# Patient Record
Sex: Male | Born: 1935 | Race: White | Smoking: Former smoker
Health system: Southern US, Community
[De-identification: ages and names within clinical notes are randomized; demographics above are authoritative.]

## PROBLEM LIST (undated history)

## (undated) DIAGNOSIS — I10 Essential (primary) hypertension: Secondary | ICD-10-CM

## (undated) DIAGNOSIS — G2581 Restless legs syndrome: Secondary | ICD-10-CM

## (undated) HISTORY — PX: FISSURECTOMY: SHX5244

## (undated) HISTORY — PX: ABLATION: SHX5711

## (undated) HISTORY — PX: APPENDECTOMY: SHX54

## (undated) NOTE — *Deleted (*Deleted)
Transition of Care [facility]) - Progression Note    Patient Details  Name: [name] MRN: [id number] Date of Birth: 01-30-1930  Transition of Care [facility]) CM/SW Contact  Liliana Cline, LCSW Phone Number: 04/10/2020, 1:26 PM  Clinical Narrative:       Expected Discharge Plan: Skilled Nursing Facility Barriers to Discharge: Continued Medical Work up  Expected Discharge Plan and Services Expected Discharge Plan: Skilled Nursing Facility       Living arrangements for the past 2 months: Assisted Living Facility                                       Social Determinants of Health (SDOH) Interventions    Readmission Risk Interventions Readmission Risk Prevention Plan 04/07/2020  Transportation Screening Complete  PCP or Specialist Appt within 3-5 Days Complete  HRI or Home Care Consult Complete  Social Work Consult for Recovery Care Planning/Counseling Complete  Palliative Care Screening Not Applicable  Medication Review Ocean[employer]) Complete  Some recent data might be hidden

---

## 2015-11-06 ENCOUNTER — Emergency Department
Admission: EM | Admit: 2015-11-06 | Discharge: 2015-11-06 | Disposition: A | Discharge status: Home / Self Care | Payer: Medicare Other | Attending: Emergency Medicine | Admitting: Emergency Medicine

## 2015-11-06 ENCOUNTER — Encounter: Payer: Self-pay | Admitting: Emergency Medicine

## 2015-11-06 DIAGNOSIS — R51 Headache: Secondary | ICD-10-CM | POA: Insufficient documentation

## 2015-11-06 DIAGNOSIS — Z87891 Personal history of nicotine dependence: Secondary | ICD-10-CM | POA: Diagnosis not present

## 2015-11-06 DIAGNOSIS — R519 Headache, unspecified: Secondary | ICD-10-CM

## 2015-11-06 DIAGNOSIS — I10 Essential (primary) hypertension: Secondary | ICD-10-CM | POA: Insufficient documentation

## 2015-11-06 HISTORY — DX: Essential (primary) hypertension: I10

## 2015-11-06 HISTORY — DX: Restless legs syndrome: G25.81

## 2015-11-06 LAB — CBC WITH DIFFERENTIAL/PLATELET
BASOS ABS: 0.1 10*3/uL (ref 0–0.1)
Basophils Relative: 1 %
EOS PCT: 2 %
Eosinophils Absolute: 0.2 10*3/uL (ref 0–0.7)
HCT: 43.8 % (ref 40.0–52.0)
HEMOGLOBIN: 15 g/dL (ref 13.0–18.0)
LYMPHS ABS: 1.4 10*3/uL (ref 1.0–3.6)
LYMPHS PCT: 16 %
MCH: 28.8 pg (ref 26.0–34.0)
MCHC: 34.3 g/dL (ref 32.0–36.0)
MCV: 83.8 fL (ref 80.0–100.0)
Monocytes Absolute: 0.9 10*3/uL (ref 0.2–1.0)
Monocytes Relative: 10 %
NEUTROS ABS: 6.4 10*3/uL (ref 1.4–6.5)
NEUTROS PCT: 71 %
PLATELETS: 264 10*3/uL (ref 150–440)
RBC: 5.23 MIL/uL (ref 4.40–5.90)
RDW: 14.2 % (ref 11.5–14.5)
WBC: 8.9 10*3/uL (ref 3.8–10.6)

## 2015-11-06 LAB — COMPREHENSIVE METABOLIC PANEL
ALK PHOS: 66 U/L (ref 38–126)
ALT: 21 U/L (ref 17–63)
AST: 31 U/L (ref 15–41)
Albumin: 4.1 g/dL (ref 3.5–5.0)
Anion gap: 7 (ref 5–15)
BUN: 17 mg/dL (ref 6–20)
CALCIUM: 8.7 mg/dL — AB (ref 8.9–10.3)
CHLORIDE: 101 mmol/L (ref 101–111)
CO2: 27 mmol/L (ref 22–32)
CREATININE: 1.42 mg/dL — AB (ref 0.61–1.24)
GFR calc Af Amer: 51 mL/min — ABNORMAL LOW (ref >60)
GFR, EST NON AFRICAN AMERICAN: 44 mL/min — AB (ref >60)
Glucose, Bld: 93 mg/dL (ref 65–99)
Potassium: 3.8 mmol/L (ref 3.5–5.1)
Sodium: 135 mmol/L (ref 135–145)
Total Bilirubin: 0.3 mg/dL (ref 0.3–1.2)
Total Protein: 6.9 g/dL (ref 6.5–8.1)

## 2015-11-06 LAB — TROPONIN I

## 2015-11-06 MED ORDER — METOCLOPRAMIDE HCL 10 MG PO TABS
10.0000 mg | ORAL_TABLET | Freq: Once | ORAL | Status: AC
  Administered 2015-11-06: 10 mg via ORAL
  Filled 2015-11-06: qty 1

## 2015-11-06 MED ORDER — ACETAMINOPHEN 325 MG PO TABS
650.0000 mg | ORAL_TABLET | Freq: Once | ORAL | Status: AC
  Administered 2015-11-06: 650 mg via ORAL
  Filled 2015-11-06: qty 2

## 2015-11-06 NOTE — Discharge Instructions (Signed)
Eat low salt diet.   Continue taking lisinopril as prescribed by your doctor.   See your doctor   Take tylenol, motrin for headaches.   Return to ER if you have worse headaches, vomiting, dizziness

## 2015-11-06 NOTE — ED Provider Notes (Signed)
CSN: [id number]     Arrival date & time 11/06/15  1100 History   First MD Initiated Contact with Patient 11/06/15 1136     Chief Complaint  Patient presents with  . Hypertension     (Consider location/radiation/quality/duration/timing/severity/associated sxs/prior Treatment) The history is provided by the patient.  Paul Bryan is a 82 y.o. male hx of HTN, Who presenting with hypertension, headaches. Patient is here on vacation visiting family. He isn't eating a lot of salty food yesterday and fast food. He woke up this morning with some mild frontal headaches. Denies any blurry vision or neck pain or fevers. Denies any nausea or vomiting. His any chest pain shortness breath or abdominal pain. He checks his blood pressure and was 178/86. He took lisinopril 5 mg afterwards and triage BP was 188/85. He denies hx of headaches or aneurysms. He is on lisinopril 5 mg if his SBP is above 150 and not daily.    Past Medical History  Diagnosis Date  . Hypertension   . Restless leg syndrome    Past Surgical History  Procedure Laterality Date  . Fissurectomy    . Appendectomy    . Ablation     History reviewed. No pertinent family history. Social History  Substance Use Topics  . Smoking status: Former Games developermoker  . Smokeless tobacco: None  . Alcohol Use: No    Review of Systems  Neurological: Positive for headaches.  All other systems reviewed and are negative.     Allergies  Review of patient's allergies indicates no known allergies.  Home Medications   Prior to Admission medications   Not on File   BP 165/83 mmHg  Pulse 66  Temp(Src) 98.2 F (36.8 C) (Oral)  Resp 17  Ht 5\' 8"  (1.727 m)  Wt 180 lb (81.647 kg)  BMI 27.38 kg/m2  SpO2 98% Physical Exam  Constitutional: He is oriented to person, place, and time. He appears well-developed and well-nourished.  HENT:  Head: Normocephalic.  Mouth/Throat: Oropharynx is clear and moist.  Eyes: Conjunctivae are normal. Pupils  are equal, round, and reactive to light.  Neck: Normal range of motion. Neck supple.  Cardiovascular: Normal rate, regular rhythm and normal heart sounds.   Pulmonary/Chest: Effort normal and breath sounds normal. No respiratory distress. He has no wheezes. He has no rales.  Abdominal: Soft. Bowel sounds are normal. He exhibits no distension. There is no tenderness. There is no rebound.  Musculoskeletal: Normal range of motion. He exhibits no edema or tenderness.  Neurological: He is alert and oriented to person, place, and time.  CN 2-12 intact. Nl strength throughout. Nl finger to nose. Nl gait   Skin: Skin is warm and dry.  Psychiatric: He has a normal mood and affect. His behavior is normal. Judgment and thought content normal.  Nursing note and vitals reviewed.   ED Course  Procedures (including critical care time) Labs Review Labs Reviewed  COMPREHENSIVE METABOLIC PANEL - Abnormal; Notable for the following:    Creatinine, Ser 1.42 (*)    Calcium 8.7 (*)    GFR calc non Af Amer 44 (*)    GFR calc Af Amer 51 (*)    All other components within normal limits  CBC WITH DIFFERENTIAL/PLATELET  TROPONIN I    Imaging Review No results found. I have personally reviewed and evaluated these images and lab results as part of my medical decision-making.  ED ECG REPORT I, YAO, DAVID, the attending physician, personally viewed and interpreted this  ECG.   Date: 11/06/2015  EKG Time:11:14   Rate: 69  Rhythm: normal EKG, normal sinus rhythm  Axis: normal  Intervals:none  ST&T Change: nonspecific    MDM   Final diagnoses:  None   Paul Bryan is a 82 y.o. male here with hypertension, headaches. Nl neuro exam currently, no signs of SAH. Took BP meds at home. Will check labs, give migraine cocktail and reassess.   1:24 PM BP down to 165/83 from 188/85 on arrival. Headache improved. Felt better. Recommend continue lisinopril as prescribed (take it only if SBP greater than 150),  low salt diet.     Richardean Canalavid H Yao, MD 11/06/15 1325

## 2015-11-06 NOTE — ED Notes (Signed)
Patient is here from out of town. Has been eating a lot of salt. BP has only been elevated this morning.

## 2015-11-06 NOTE — ED Notes (Signed)
Pt presents with high blood pressure. Wife states that when she took it at home it was 178/86 and then 185/something. Pt states that he took 5mg  lisinopril 15 minutes ago; c/o headache 2/10. Pt alert & oriented with NAD noted.

## 2017-10-09 ENCOUNTER — Inpatient Hospital Stay
Admission: EM | Admit: 2017-10-09 | Discharge: 2017-10-10 | DRG: 378 | Disposition: A | Discharge status: Home / Self Care | Payer: Medicare Other | Attending: Internal Medicine | Admitting: Internal Medicine

## 2017-10-09 ENCOUNTER — Other Ambulatory Visit: Payer: Self-pay

## 2017-10-09 DIAGNOSIS — K449 Diaphragmatic hernia without obstruction or gangrene: Secondary | ICD-10-CM | POA: Diagnosis not present

## 2017-10-09 DIAGNOSIS — D508 Other iron deficiency anemias: Secondary | ICD-10-CM | POA: Diagnosis not present

## 2017-10-09 DIAGNOSIS — K921 Melena: Secondary | ICD-10-CM

## 2017-10-09 DIAGNOSIS — Z87891 Personal history of nicotine dependence: Secondary | ICD-10-CM

## 2017-10-09 DIAGNOSIS — N179 Acute kidney failure, unspecified: Secondary | ICD-10-CM | POA: Diagnosis not present

## 2017-10-09 DIAGNOSIS — N400 Enlarged prostate without lower urinary tract symptoms: Secondary | ICD-10-CM | POA: Diagnosis present

## 2017-10-09 DIAGNOSIS — D62 Acute posthemorrhagic anemia: Secondary | ICD-10-CM | POA: Diagnosis not present

## 2017-10-09 DIAGNOSIS — I959 Hypotension, unspecified: Secondary | ICD-10-CM | POA: Diagnosis present

## 2017-10-09 DIAGNOSIS — I10 Essential (primary) hypertension: Secondary | ICD-10-CM | POA: Diagnosis not present

## 2017-10-09 DIAGNOSIS — K5520 Angiodysplasia of colon without hemorrhage: Secondary | ICD-10-CM | POA: Diagnosis not present

## 2017-10-09 DIAGNOSIS — K5731 Diverticulosis of large intestine without perforation or abscess with bleeding: Principal | ICD-10-CM | POA: Diagnosis present

## 2017-10-09 DIAGNOSIS — E611 Iron deficiency: Secondary | ICD-10-CM | POA: Diagnosis present

## 2017-10-09 DIAGNOSIS — K922 Gastrointestinal hemorrhage, unspecified: Secondary | ICD-10-CM | POA: Diagnosis present

## 2017-10-09 DIAGNOSIS — F0280 Dementia in other diseases classified elsewhere without behavioral disturbance: Secondary | ICD-10-CM | POA: Diagnosis present

## 2017-10-09 DIAGNOSIS — G309 Alzheimer's disease, unspecified: Secondary | ICD-10-CM | POA: Diagnosis not present

## 2017-10-09 DIAGNOSIS — E785 Hyperlipidemia, unspecified: Secondary | ICD-10-CM | POA: Diagnosis present

## 2017-10-09 LAB — COMPREHENSIVE METABOLIC PANEL
ALT: 27 U/L (ref 17–63)
AST: 35 U/L (ref 15–41)
Albumin: 4 g/dL (ref 3.5–5.0)
Alkaline Phosphatase: 67 U/L (ref 38–126)
Anion gap: 10 (ref 5–15)
BUN: 21 mg/dL — AB (ref 6–20)
CHLORIDE: 106 mmol/L (ref 101–111)
CO2: 22 mmol/L (ref 22–32)
Calcium: 8.8 mg/dL — ABNORMAL LOW (ref 8.9–10.3)
Creatinine, Ser: 1.76 mg/dL — ABNORMAL HIGH (ref 0.61–1.24)
GFR, EST AFRICAN AMERICAN: 39 mL/min — AB (ref >60)
GFR, EST NON AFRICAN AMERICAN: 33 mL/min — AB (ref >60)
Glucose, Bld: 108 mg/dL — ABNORMAL HIGH (ref 65–99)
POTASSIUM: 3.8 mmol/L (ref 3.5–5.1)
SODIUM: 138 mmol/L (ref 135–145)
Total Bilirubin: 0.6 mg/dL (ref 0.3–1.2)
Total Protein: 6.8 g/dL (ref 6.5–8.1)

## 2017-10-09 LAB — ABO/RH: ABO/RH(D): O POS

## 2017-10-09 LAB — CBC
HCT: 41.4 % (ref 40.0–52.0)
Hemoglobin: 14.2 g/dL (ref 13.0–18.0)
MCH: 29 pg (ref 26.0–34.0)
MCHC: 34.3 g/dL (ref 32.0–36.0)
MCV: 84.7 fL (ref 80.0–100.0)
Platelets: 319 10*3/uL (ref 150–440)
RBC: 4.88 MIL/uL (ref 4.40–5.90)
RDW: 14.4 % (ref 11.5–14.5)
WBC: 7.6 10*3/uL (ref 3.8–10.6)

## 2017-10-09 LAB — FOLATE: Folate: 36 ng/mL (ref >5.9)

## 2017-10-09 LAB — IRON AND TIBC
Iron: 99 ug/dL (ref 45–182)
SATURATION RATIOS: 30 % (ref 17.9–39.5)
TIBC: 331 ug/dL (ref 250–450)
UIBC: 232 ug/dL

## 2017-10-09 LAB — HEMOGLOBIN AND HEMATOCRIT, BLOOD
HEMATOCRIT: 38 % — AB (ref 40.0–52.0)
HEMATOCRIT: 38.7 % — AB (ref 40.0–52.0)
HEMATOCRIT: 34.8 % — AB (ref 40.0–52.0)
HEMOGLOBIN: 12.9 g/dL — AB (ref 13.0–18.0)
HEMOGLOBIN: 12 g/dL — AB (ref 13.0–18.0)
Hemoglobin: 13 g/dL (ref 13.0–18.0)

## 2017-10-09 LAB — APTT: aPTT: 28 seconds (ref 24–36)

## 2017-10-09 LAB — TSH: TSH: 6.147 u[IU]/mL — ABNORMAL HIGH (ref 0.350–4.500)

## 2017-10-09 LAB — FERRITIN: Ferritin: 15 ng/mL — ABNORMAL LOW (ref 24–336)

## 2017-10-09 LAB — VITAMIN B12: VITAMIN B 12: 658 pg/mL (ref 180–914)

## 2017-10-09 LAB — MRSA PCR SCREENING: MRSA by PCR: NEGATIVE

## 2017-10-09 LAB — PROTIME-INR
INR: 0.93
Prothrombin Time: 12.4 seconds (ref 11.4–15.2)

## 2017-10-09 MED ORDER — PANTOPRAZOLE SODIUM 40 MG IV SOLR: 40.0000 mg | Freq: Two times a day (BID) | INTRAVENOUS | Status: DC

## 2017-10-09 MED ORDER — ONDANSETRON HCL 4 MG/2ML IJ SOLN: 4.0000 mg | Freq: Four times a day (QID) | INTRAMUSCULAR | Status: DC | PRN

## 2017-10-09 MED ORDER — SODIUM CHLORIDE 0.9 % IV SOLN
10.0000 mL/h | Freq: Once | INTRAVENOUS | Status: AC
  Administered 2017-10-09: 10 mL/h via INTRAVENOUS

## 2017-10-09 MED ORDER — FUROSEMIDE 20 MG PO TABS
20.0000 mg | ORAL_TABLET | Freq: Every day | ORAL | Status: DC
  Administered 2017-10-09 – 2017-10-10 (×2): 20 mg via ORAL
  Filled 2017-10-09 (×2): qty 1

## 2017-10-09 MED ORDER — FAMOTIDINE IN NACL 20-0.9 MG/50ML-% IV SOLN: 20.0000 mg | Freq: Two times a day (BID) | INTRAVENOUS | Status: DC

## 2017-10-09 MED ORDER — QUETIAPINE FUMARATE 25 MG PO TABS
25.0000 mg | ORAL_TABLET | Freq: Once | ORAL | Status: AC
  Administered 2017-10-09: 25 mg via ORAL
  Filled 2017-10-09: qty 1

## 2017-10-09 MED ORDER — ACETAMINOPHEN 325 MG PO TABS: 650.0000 mg | ORAL_TABLET | Freq: Four times a day (QID) | ORAL | Status: DC | PRN

## 2017-10-09 MED ORDER — GALANTAMINE HYDROBROMIDE 4 MG PO TABS
8.0000 mg | ORAL_TABLET | Freq: Every day | ORAL | Status: DC
  Administered 2017-10-09 – 2017-10-10 (×2): 8 mg via ORAL
  Filled 2017-10-09 (×2): qty 2

## 2017-10-09 MED ORDER — FERUMOXYTOL INJECTION 510 MG/17 ML
510.0000 mg | Freq: Once | INTRAVENOUS | Status: AC
  Administered 2017-10-09: 510 mg via INTRAVENOUS
  Filled 2017-10-09: qty 17

## 2017-10-09 MED ORDER — SIMVASTATIN 20 MG PO TABS
20.0000 mg | ORAL_TABLET | Freq: Every day | ORAL | Status: DC
  Administered 2017-10-09: 20 mg via ORAL
  Filled 2017-10-09: qty 1

## 2017-10-09 MED ORDER — POTASSIUM CHLORIDE CRYS ER 10 MEQ PO TBCR
10.0000 meq | EXTENDED_RELEASE_TABLET | Freq: Two times a day (BID) | ORAL | Status: DC
  Administered 2017-10-09 (×2): 10 meq via ORAL
  Filled 2017-10-09 (×2): qty 1

## 2017-10-09 MED ORDER — SODIUM CHLORIDE 0.9 % IV SOLN
INTRAVENOUS | Status: DC
  Administered 2017-10-09 – 2017-10-10 (×5): via INTRAVENOUS

## 2017-10-09 MED ORDER — SODIUM CHLORIDE 0.9 % IV SOLN
80.0000 mg | Freq: Once | INTRAVENOUS | Status: AC
  Administered 2017-10-09: 80 mg via INTRAVENOUS
  Filled 2017-10-09: qty 80

## 2017-10-09 MED ORDER — PEG 3350-KCL-NA BICARB-NACL 420 G PO SOLR
4000.0000 mL | Freq: Once | ORAL | Status: AC
  Administered 2017-10-09: 4000 mL via ORAL
  Filled 2017-10-09 (×2): qty 4000

## 2017-10-09 MED ORDER — TAMSULOSIN HCL 0.4 MG PO CAPS
0.4000 mg | ORAL_CAPSULE | Freq: Every day | ORAL | Status: DC
  Administered 2017-10-09 – 2017-10-10 (×2): 0.4 mg via ORAL
  Filled 2017-10-09 (×2): qty 1

## 2017-10-09 MED ORDER — SODIUM CHLORIDE 0.9 % IV SOLN
8.0000 mg/h | INTRAVENOUS | Status: DC
  Administered 2017-10-09 – 2017-10-10 (×3): 8 mg/h via INTRAVENOUS
  Filled 2017-10-09 (×3): qty 80

## 2017-10-09 MED ORDER — SODIUM CHLORIDE 0.9 % IV BOLUS: 1000.0000 mL | Freq: Once | INTRAVENOUS | Status: DC

## 2017-10-09 MED ORDER — ONDANSETRON HCL 4 MG PO TABS: 4.0000 mg | ORAL_TABLET | Freq: Four times a day (QID) | ORAL | Status: DC | PRN

## 2017-10-09 MED ORDER — PANTOPRAZOLE SODIUM 40 MG PO TBEC: 40.0000 mg | DELAYED_RELEASE_TABLET | Freq: Every day | ORAL | Status: DC

## 2017-10-09 MED ORDER — ACETAMINOPHEN 650 MG RE SUPP: 650.0000 mg | Freq: Four times a day (QID) | RECTAL | Status: DC | PRN

## 2017-10-09 MED ORDER — DOCUSATE SODIUM 100 MG PO CAPS: 100.0000 mg | ORAL_CAPSULE | Freq: Two times a day (BID) | ORAL | Status: DC

## 2017-10-09 NOTE — ED Provider Notes (Addendum)
[facility] Emergency Department Provider Note   ____________________________________________   First MD Initiated Contact with Patient 10/09/17 0101     (approximate)  I have reviewed the triage vital signs and the nursing notes.   HISTORY  Chief Complaint Rectal Bleeding    HPI Paul Bryan is a 84 y.o. male who comes into the hospital today with blood in the stool.  The patient states that it started around 1030.  He had a bowel movement and it was bright red blood that filled the toilet.  He had a second episode around 11 PM and then another episode here in the emergency department.  He reports that he is never had this before.  The patient denies any rectal pain or abdominal pain.  He states that he has no dizziness but always feels lightheaded.  The patient denies chest pain or shortness of breath.  He was concerned so he decided to come into the hospital today.  The patient does not take any blood thinners regularly.   Past Medical History:  Diagnosis Date  . Hypertension   . Restless leg syndrome     Patient Active Problem List   Diagnosis Date Noted  . GIB (gastrointestinal bleeding) 10/09/2017    Past Surgical History:  Procedure Laterality Date  . ABLATION    . APPENDECTOMY    . FISSURECTOMY      Prior to Admission medications   Medication Sig Start Date End Date Taking? Authorizing Provider  furosemide (LASIX) 20 MG tablet Take 20 mg by mouth daily. 07/19/17  Yes [provider]  galantamine (RAZADYNE) 8 MG tablet Take 8 mg by mouth daily.  09/08/17  Yes [provider]  KLOR-CON M10 10 MEQ tablet Take 10 mEq by mouth 2 (two) times daily.  09/08/17  Yes [provider]  pantoprazole (PROTONIX) 40 MG tablet Take 40 mg by mouth daily. 08/25/17  Yes [provider]  simvastatin (ZOCOR) 20 MG tablet Take 20 mg by mouth at bedtime.  09/14/17  Yes [provider]  tamsulosin (FLOMAX) 0.4 MG CAPS  capsule Take 0.4 mg by mouth daily. 09/12/17  Yes [provider]    Allergies Patient has no known allergies.  No family history on file.  Social History Social History   Tobacco Use  . Smoking status: Former Smoker  Substance Use Topics  . Alcohol use: No  . Drug use: Not on file    Review of Systems  Constitutional: No fever/chills Eyes: No visual changes. ENT: No sore throat. Cardiovascular: Denies chest pain. Respiratory: Denies shortness of breath. Gastrointestinal: Rectal bleeding with no abdominal pain.  No nausea, no vomiting.  No diarrhea.  No constipation. Genitourinary: Negative for dysuria. Musculoskeletal: Negative for back pain. Skin: Negative for rash. Neurological: Dizziness and lightheadedness   ____________________________________________   PHYSICAL EXAM:  VITAL SIGNS: ED Triage Vitals  Enc Vitals Group     BP 10/09/17 0009 140/77     Pulse Rate 10/09/17 0009 69     Resp 10/09/17 0009 18     Temp 10/09/17 0009 97.6 F (36.4 C)     Temp Source 10/09/17 0009 Oral     SpO2 10/09/17 0009 92 %     Weight 10/09/17 0008 150 lb (68 kg)     Height 10/09/17 0008 5\' 7"  (1.702 m)     Head Circumference --      Peak Flow --      Pain Score 10/09/17 0008 0  Pain Loc --      Pain Edu? --      Excl. in GC? --     Constitutional: Alert and oriented. Well appearing and in no acute distress. Eyes: Conjunctivae are normal. PERRL. EOMI. Head: Atraumatic. Nose: No congestion/rhinnorhea. Mouth/Throat: Mucous membranes are moist.  Oropharynx non-erythematous. Cardiovascular: Normal rate, regular rhythm. Grossly normal heart sounds.  Good peripheral circulation. Respiratory: Normal respiratory effort.  No retractions. Lungs CTAB. Gastrointestinal: Soft and nontender. No distention.  Positive bowel sounds. Musculoskeletal: No lower extremity tenderness nor edema.   Neurologic:  Normal speech and language.  Skin:  Skin is warm, dry and intact.    Psychiatric: Mood and affect are normal.   ____________________________________________   LABS (all labs ordered are listed, but only abnormal results are displayed)  Labs Reviewed  COMPREHENSIVE METABOLIC PANEL - Abnormal; Notable for the following components:      Result Value   Glucose, Bld 108 (*)    BUN 21 (*)    Creatinine, Ser 1.76 (*)    Calcium 8.8 (*)    GFR calc non Af Amer 33 (*)    GFR calc Af Amer 39 (*)    All other components within normal limits  CBC  POC OCCULT BLOOD, ED  TYPE AND SCREEN  PREPARE RBC (CROSSMATCH)   ____________________________________________  EKG  ED ECG REPORT I, Rebecka Apley, the attending physician, personally viewed and interpreted this ECG.   Date: 10/09/2017  EKG Time: 139  Rate: 89  Rhythm: sinus arrythmia  Axis: normal  Intervals:none  ST&T Change: normal  ____________________________________________  RADIOLOGY  ED MD interpretation:  none  Official radiology report(s): No results found.  ____________________________________________   PROCEDURES  Procedure(s) performed: please, see procedure note(s).  .Critical Care Performed by: Rebecka Apley, MD Authorized by: Rebecka Apley, MD   Critical care provider statement:    Critical care time (minutes):  30   Critical care start time:  10/09/2017 2:45 AM   Critical care end time:  10/09/2017 3:15 AM   Critical care time was exclusive of:  Separately billable procedures and treating other patients   Critical care was necessary to treat or prevent imminent or life-threatening deterioration of the following conditions:  Shock   Critical care was time spent personally by me on the following activities:  Development of treatment plan with patient or surrogate, evaluation of patient's response to treatment, examination of patient, obtaining history from patient or surrogate, ordering and performing treatments and interventions, ordering and review of  laboratory studies, ordering and review of radiographic studies, pulse oximetry, re-evaluation of patient's condition and review of old charts   I assumed direction of critical care for this patient from another provider in my specialty: no      Critical Care performed: Yes, see critical care note(s)  ____________________________________________   INITIAL IMPRESSION / ASSESSMENT AND PLAN / ED COURSE  As part of my medical decision making, I reviewed the following data within the electronic MEDICAL RECORD NUMBER Notes from prior ED visits and [place] Controlled Substance Database   This is an 84 year old male who comes into the hospital today with  3 episodes of bright red blood per rectum.  My differential diagnosis includes diverticular bleed, upper GI bleed, colitis, diverticulitis.  At this time the patient is not having any pain.  He did have a bowel movement in the emergency department and has blood all over the toilet as well as on the toilet paper. The  patient's blood work did return and although he has some mild elevation of his creatinine his remaining blood work is unremarkable.  I am concerned for a diverticular bleed given the amount of blood that is in the toilet.  I did contact the hospitalist and the patient will be admitted to their service for further evaluation of his lower GI bleed.  We will continue to monitor the patient for vital sign instability or any other concerns.  After the patient was admitted to the hospitalist service he continued to have more episodes of bright red blood per rectum.  I was called to the patient's bedside around 245 and he was found to be pale and diaphoretic.  We recycled the patient's blood pressure and was found to be 72/50.  The patient was given a liter of normal saline and we did call for some emergent release blood.  The patient though has some antibodies in his blood work and we are still working on a Immunologist.  We did start a second line but the  patient's blood pressure did improve with the fluids.  We will start a second liter and transfuse once we have blood available.  The patient will be admitted to the stepdown unit.      ____________________________________________   FINAL CLINICAL IMPRESSION(S) / ED DIAGNOSES  Final diagnoses:  Lower GI bleed     ED Discharge Orders    None       Note:  This document was prepared using Dragon voice recognition software and may include unintentional dictation errors.    Rebecka Apley, MD 10/09/17 0146    Rebecka Apley, MD 10/09/17 [phone]

## 2017-10-09 NOTE — ED Notes (Signed)
Report called to CCU, given to Amada [place]ale, [place]

## 2017-10-09 NOTE — H&P (Signed)
Paul Bryan is an 84 y.o. male.   Chief Complaint: Rectal bleeding HPI: The patient with past medical history of hypertension and restless leg syndrome presents to the emergency department after noticing bright red blood per rectum.  In the emergency department he had another large volume red bloody stool after which she became confused, pale and mildly diaphoretic.  His blood pressure also dropped to systolic 16W.  He was volume resuscitated with 2 L of normal saline.  Emergent blood was released for transfusion in the emergency department staff called the hospitalist service for admission.  Past Medical History:  Diagnosis Date  . Hypertension   . Restless leg syndrome     Past Surgical History:  Procedure Laterality Date  . ABLATION    . APPENDECTOMY    . FISSURECTOMY      No family history on file.  Unknown to family at bedside Social History:  reports that he has quit smoking. He does not have any smokeless tobacco history on file. He reports that he does not drink alcohol. His drug history is not on file.  Allergies: No Known Allergies  Medications Prior to Admission  Medication Sig Dispense Refill  . furosemide (LASIX) 20 MG tablet Take 20 mg by mouth daily.  1  . galantamine (RAZADYNE) 8 MG tablet Take 8 mg by mouth daily.   1  . KLOR-CON M10 10 MEQ tablet Take 10 mEq by mouth 2 (two) times daily.   1  . pantoprazole (PROTONIX) 40 MG tablet Take 40 mg by mouth daily.  3  . simvastatin (ZOCOR) 20 MG tablet Take 20 mg by mouth at bedtime.   0  . tamsulosin (FLOMAX) 0.4 MG CAPS capsule Take 0.4 mg by mouth daily.  1    Results for orders placed or performed during the hospital encounter of 10/09/17 (from the past 48 hour(s))  Comprehensive metabolic panel     Status: Abnormal   Collection Time: 10/09/17 12:13 AM  Result Value Ref Range   Sodium 138 135 - 145 mmol/L   Potassium 3.8 3.5 - 5.1 mmol/L   Chloride 106 101 - 111 mmol/L   CO2 22 22 - 32 mmol/L   Glucose, Bld  108 (H) 65 - 99 mg/dL   BUN 21 (H) 6 - 20 mg/dL   Creatinine, Ser 1.76 (H) 0.61 - 1.24 mg/dL   Calcium 8.8 (L) 8.9 - 10.3 mg/dL   Total Protein 6.8 6.5 - 8.1 g/dL   Albumin 4.0 3.5 - 5.0 g/dL   AST 35 15 - 41 U/L   ALT 27 17 - 63 U/L   Alkaline Phosphatase 67 38 - 126 U/L   Total Bilirubin 0.6 0.3 - 1.2 mg/dL   GFR calc non Af Amer 33 (L) >60 mL/min   GFR calc Af Amer 39 (L) >60 mL/min    Comment: (NOTE) The eGFR has been calculated using the CKD EPI equation. This calculation has not been validated in all clinical situations. eGFR's persistently <60 mL/min signify possible Chronic Kidney Disease.    Anion gap 10 5 - 15    Comment: Performed at [facility], [address] [place], [place] [address]  CBC     Status: None   Collection Time: 10/09/17 12:13 AM  Result Value Ref Range   WBC 7.6 3.8 - 10.6 K/uL   RBC 4.88 4.40 - 5.90 MIL/uL   Hemoglobin 14.2 13.0 - 18.0 g/dL   HCT 41.4 40.0 - 52.0 %  MCV 84.7 80.0 - 100.0 fL   MCH 29.0 26.0 - 34.0 pg   MCHC 34.3 32.0 - 36.0 g/dL   RDW 14.4 11.5 - 14.5 %   Platelets 319 150 - 440 K/uL    Comment: Performed at [facility], [address] [place], [place] [address]  Type and screen [place]     Status: None (Preliminary result)   Collection Time: 10/09/17 12:13 AM  Result Value Ref Range   ABO/RH(D) O POS    Antibody Screen NEG    Sample Expiration 10/12/2017    Unit Number [id number]    Blood Component Type RED CELLS,LR    Unit division 00    Status of Unit ISSUED    Transfusion Status OK TO TRANSFUSE    Crossmatch Result      COMPATIBLE Performed at [facility], [address] [place], [place] [address]    Unit Number [id number]    Blood Component Type RED CELLS,LR    Unit division 00    Status of Unit ALLOCATED    Transfusion Status OK TO TRANSFUSE    Crossmatch Result COMPATIBLE   Prepare RBC     Status: None   Collection Time: 10/09/17  3:09 AM   Result Value Ref Range   Order Confirmation      ORDER PROCESSED BY BLOOD BANK Performed at [facility], [address], [place], [place] [address]    No results found.  Review of Systems  Constitutional: Negative for chills and fever.  HENT: Negative for sore throat and tinnitus.   Eyes: Negative for blurred vision and redness.  Respiratory: Negative for cough and shortness of breath.   Cardiovascular: Negative for chest pain, palpitations, orthopnea and PND.  Gastrointestinal: Positive for blood in stool. Negative for abdominal pain, diarrhea, nausea and vomiting.  Genitourinary: Negative for dysuria, frequency and urgency.  Musculoskeletal: Negative for joint pain and myalgias.  Skin: Negative for rash.       No lesions  Neurological: Negative for speech change, focal weakness and weakness.  Endo/Heme/Allergies: Does not bruise/bleed easily.       No temperature intolerance  Psychiatric/Behavioral: Negative for depression and suicidal ideas.    Blood pressure (!) 142/77, pulse 72, temperature (!) 97.4 F (36.3 C), temperature source Oral, resp. rate 20, height '5\' 7"'  (1.702 m), weight 68 kg (150 lb), SpO2 97 %. Physical Exam  Vitals reviewed. Constitutional: He is oriented to person, place, and time. He appears well-developed and well-nourished. No distress.  HENT:  Head: Normocephalic and atraumatic.  Mouth/Throat: Oropharynx is clear and moist.  Eyes: Pupils are equal, round, and reactive to light. Conjunctivae and EOM are normal. No scleral icterus.  Neck: Normal range of motion. Neck supple. No JVD present. No tracheal deviation present. No thyromegaly present.  Cardiovascular: Normal rate, regular rhythm and normal heart sounds. Exam reveals no gallop and no friction rub.  No murmur heard. Respiratory: Effort normal and breath sounds normal. No respiratory distress.  GI: Soft. Bowel sounds are normal. He exhibits no distension. There is no tenderness.   Genitourinary:  Genitourinary Comments: Deferred  Musculoskeletal: Normal range of motion. He exhibits no edema.  Lymphadenopathy:    He has no cervical adenopathy.  Neurological: He is alert and oriented to person, place, and time. No cranial nerve deficit.  Skin: Skin is warm and dry. No rash noted. No erythema.  Psychiatric: He has a normal mood and affect. His behavior is normal. Judgment  and thought content normal.     Assessment/Plan This is an 84 year old male admitted for GI bleeding. 1.  GI bleed: Brisk; continue volume resuscitation.  Type and screen for type specific blood.  Gastroenterology consulted.  Pepcid IV due to Protonix national back order. 2.  Hypertension: Controlled; continue to monitor for hypotension due to GI blood loss 3.  Dementia: Alzheimer's type: Continue Razadyne per home regimen 4.  Hyperlipidemia: Continue statin therapy 5.  BPH: Continue tamsulosin 6.  DVT prophylaxis: SCDs 7.  GI prophylaxis: As above The patient is a full code.  Time spent on admission orders and critical care approximately 45 minutes. Discussed with [facility] telemedicine  Harrie Foreman, MD 10/09/2017, 5:16 AM

## 2017-10-09 NOTE — Progress Notes (Signed)
Pt resting comfortably , VSS. No BM so far this am  Hgb results 12.2 , discussed with MD , to hold second unit of blood at this time and monitor Hbg every 6 hours

## 2017-10-09 NOTE — ED Triage Notes (Signed)
Reports noticed rectal bleeding x 2 tonight.

## 2017-10-09 NOTE — Consult Note (Signed)
Name: Paul Bryan MRN: [id number] DOB: 1929-10-24    ADMISSION DATE:  10/09/2017 CONSULTATION DATE: 10/09/2017  REFERRING MD : Dr. Sheryle Hail   CHIEF COMPLAINT: GI Bleed   BRIEF PATIENT DESCRIPTION:  84 yo male admitted with lower GI bleed   SIGNIFICANT EVENTS/STUDIES:  06/2 Pt admitted to stepdown unit   HISTORY OF PRESENT ILLNESS:   This is an 84 yo male with a PMH of Restless Leg Syndrome and HTN.  He presented to [facility] ER on 06/2 with bleeding per rectum. Per ER notes the pt stated he had a bowel movement with bright red blood that filled the toilet at 2230 and than a second episode at 2300 on 06/1.  Upon arrival to the ER he had another episode of bright red blood per rectum, and developed hypotension bp 72/50 he received 2L fluid bolus. Lab results revealed creatinine 1.76, BUN 21, rbc 4.88, hgb 14.2, and platelets 319.  Due to active bleeding 2 units pRBC's ordered. According to pts grandson the pt does not take any anticoagulants, ibuprofen, goody powders, or herbal remedies. He was subsequently admitted to the stepdown unit by hospitalist team for further workup and treatment.  PAST MEDICAL HISTORY :   has a past medical history of Hypertension and Restless leg syndrome.  has a past surgical history that includes Fissurectomy; Appendectomy; and Ablation. Prior to Admission medications   Medication Sig Start Date End Date Taking? Authorizing Provider  furosemide (LASIX) 20 MG tablet Take 20 mg by mouth daily. 07/19/17  Yes [provider]  galantamine (RAZADYNE) 8 MG tablet Take 8 mg by mouth daily.  09/08/17  Yes [provider]  KLOR-CON M10 10 MEQ tablet Take 10 mEq by mouth 2 (two) times daily.  09/08/17  Yes [provider]  pantoprazole (PROTONIX) 40 MG tablet Take 40 mg by mouth daily. 08/25/17  Yes [provider]  simvastatin (ZOCOR) 20 MG tablet Take 20 mg by mouth at bedtime.  09/14/17  Yes [provider]  tamsulosin (FLOMAX) 0.4 MG  CAPS capsule Take 0.4 mg by mouth daily. 09/12/17  Yes [provider]   No Known Allergies  FAMILY HISTORY:  family history is not on file. SOCIAL HISTORY:  reports that he has quit smoking. He does not have any smokeless tobacco history on file. He reports that he does not drink alcohol.  REVIEW OF SYSTEMS:  Unable to assess pt confused   SUBJECTIVE:  Pt currently confused   VITAL SIGNS: Temp:  [97.6 F (36.4 C)] 97.6 F (36.4 C) (06/02 0009) Pulse Rate:  [69] 69 (06/02 0009) Resp:  [18] 18 (06/02 0009) BP: (140)/(77) 140/77 (06/02 0009) SpO2:  [92 %] 92 % (06/02 0009) Weight:  [68 kg (150 lb)] 68 kg (150 lb) (06/02 0008)  PHYSICAL EXAMINATION: General: well nourished, well developed male, NAD Neuro: alert and oriented to self only, follows commands, PERRLA HEENT: supple, no JVD Cardiovascular: nsr, rrr, no R/G Lungs: diminished throughout, even, non labored Abdomen: +BS x4, soft, obese, non tender, non distended  Musculoskeletal: normal bulk and tone, no edema  Skin: intact no rashes or lesions  Recent Labs  Lab 10/09/17 0013  NA 138  K 3.8  CL 106  CO2 22  BUN 21*  CREATININE 1.76*  GLUCOSE 108*   Recent Labs  Lab 10/09/17 0013  HGB 14.2  HCT 41.4  WBC 7.6  PLT 319   No results found.  ASSESSMENT / PLAN: Lower Gastrointestinal Bleeding  Acute renal  failure  Hx: HTN P: Supplemental O2 for dyspnea and/or hypoxia Continuous telemetry monitoring Maintain map >65 NS @125  ml/hr  Trend BMP  Replace electrolytes as indicated  Monitor UOP  Avoid nephrotoxic medications  Serial H&H Monitor for s/sx of bleeding and transfuse for hgb <7 VTE px: SCD's Protonix gtt Keep NPO  GI consulted appreciate input   Sonda Rumbleana Katia Hannen, AGNP  Pulmonary/Critical Care Pager [phone] (please enter 7 digits) PCCM Consult Pager [phone] (please enter 7 digits)

## 2017-10-09 NOTE — Progress Notes (Signed)
[facility] [place] [facility]   PATIENT NAME: Paul Bryan    MR#:  [id number]  DATE OF BIRTH:  [birth year]  SUBJECTIVE:  CHIEF COMPLAINT:   Chief Complaint  Patient presents with  . Rectal Bleeding    Came with rectal bleed and hypotension. Pt have no complains, but seems to be some confused Vs demented, as he does not able to tell me- why he is here?  REVIEW OF SYSTEMS:  CONSTITUTIONAL: No fever, fatigue or weakness.  EYES: No blurred or double vision.  EARS, NOSE, AND THROAT: No tinnitus or ear pain.  RESPIRATORY: No cough, shortness of breath, wheezing or hemoptysis.  CARDIOVASCULAR: No chest pain, orthopnea, edema.  GASTROINTESTINAL: No nausea, vomiting, diarrhea or abdominal pain.  GENITOURINARY: No dysuria, hematuria.  ENDOCRINE: No polyuria, nocturia,  HEMATOLOGY: No anemia, easy bruising or bleeding SKIN: No rash or lesion. MUSCULOSKELETAL: No joint pain or arthritis.   NEUROLOGIC: No tingling, numbness, weakness.  PSYCHIATRY: No anxiety or depression.   ROS  DRUG ALLERGIES:  No Known Allergies  VITALS:  Blood pressure (!) 144/71, pulse 69, temperature 99.1 F (37.3 C), resp. rate 14, height 5\' 7"  (1.702 m), weight 68 kg (150 lb), SpO2 95 %.  PHYSICAL EXAMINATION:  GENERAL:  84 y.o.-year-old patient lying in the bed with no acute distress.  EYES: Pupils equal, round, reactive to light and accommodation. No scleral icterus. Extraocular muscles intact.  HEENT: Head atraumatic, normocephalic. Oropharynx and nasopharynx clear.  NECK:  Supple, no jugular venous distention. No thyroid enlargement, no tenderness.  LUNGS: Normal breath sounds bilaterally, no wheezing, rales,rhonchi or crepitation. No use of accessory muscles of respiration.  CARDIOVASCULAR: S1, S2 normal. No murmurs, rubs, or gallops.  ABDOMEN: Soft, nontender, nondistended. Bowel sounds present. No organomegaly or mass.  EXTREMITIES: No pedal edema, cyanosis, or clubbing.   NEUROLOGIC: Cranial nerves II through XII are intact. Muscle strength 4/5 in all extremities. Sensation intact. Gait not checked.  PSYCHIATRIC: The patient is alert and oriented x 2.  SKIN: No obvious rash, lesion, or ulcer.   Physical Exam LABORATORY PANEL:   CBC Recent Labs  Lab 10/09/17 0013 10/09/17 0828  WBC 7.6  --   HGB 14.2 12.9*  HCT 41.4 38.0*  PLT 319  --    ------------------------------------------------------------------------------------------------------------------  Chemistries  Recent Labs  Lab 10/09/17 0013  NA 138  K 3.8  CL 106  CO2 22  GLUCOSE 108*  BUN 21*  CREATININE 1.76*  CALCIUM 8.8*  AST 35  ALT 27  ALKPHOS 67  BILITOT 0.6   ------------------------------------------------------------------------------------------------------------------  Cardiac Enzymes No results for input(s): TROPONINI in the last 168 hours. ------------------------------------------------------------------------------------------------------------------  RADIOLOGY:  No results found.  ASSESSMENT AND PLAN:   Active Problems:   GIB (gastrointestinal bleeding)  This is an 84 year old male admitted for GI bleeding. 1.  GI bleed: Brisk; continue volume resuscitation.  Type and screen for type specific blood.  Gastroenterology consulted.  protonix IV. 2.  Hypertension: Controlled; continue to monitor for hypotension due to GI blood loss 3.  Dementia: Alzheimer's type: Continue Razadyne per home regimen 4.  Hyperlipidemia: Continue statin therapy 5.  BPH: Continue tamsulosin 6.  DVT prophylaxis: SCDs 7.  GI prophylaxis: As above  All the records are reviewed and case discussed with Care Management/Social Workerr. Management plans discussed with the patient, family and they are in agreement.  CODE STATUS: full.  TOTAL TIME TAKING CARE OF THIS PATIENT: 35 minutes.     POSSIBLE D/C IN 1-2  DAYS, DEPENDING ON CLINICAL CONDITION.   Altamese DillingVaibhavkumar Dazaria Macneill M.D  on 10/09/2017   Between 7am to 6pm - Pager - [phone]  After 6pm go to [email or web address] - password [email or web address]  [facility]  Office  [phone]  CC: Primary care physician; System, Pcp Not In  Note: This dictation was prepared with Dragon dictation along with smaller phrase technology. Any transcriptional errors that result from this process are unintentional.

## 2017-10-09 NOTE — Consult Note (Addendum)
Paul Darby, MD [address]  [place]  [place], [place] [address]  Main: [phone]  Fax: [phone] Pager: [phone]   Consultation  Referring Provider:     No ref. provider found Primary Care Physician:  System, Pcp Not In Primary Gastroenterologist:  Dr. Sherri Sear         Reason for Consultation:     Rectal bleeding  Date of Admission:  10/09/2017 Date of Consultation:  10/09/2017         HPI:   Paul Bryan is a 84 y.o. male with history of dementia, hypertension and restless leg syndrome, admitted with 2 episodes of hematochezia, that started last night. He had 1 episode in the emergency room. His hemoglobin on arrival was 14.2, decreased to 13 today. He did not have any further episodes today. He became hypotensive in the emergency room after another episode of hematochezia,  therefore was admitted to the ICU. His son and grandchildren are bedside. Patient is kept nothing by mouth, started on pantoprazole drip and GI is consulted for further evaluation. Patient denies similar episode in the past. He had a colonoscopy about 4 or 5 years ago in [place]. Polyps were removed according to his son. Patient denies abdominal pain, nausea, vomiting, melena, weight loss, constipation, diarrhea  NSAIDs: none  Antiplts/Anticoagulants/Anti thrombotics: aspirin 81  GI Procedures: colonoscopy less than 5 years ago, report not available  Past Medical History:  Diagnosis Date  . Hypertension   . Restless leg syndrome     Past Surgical History:  Procedure Laterality Date  . ABLATION    . APPENDECTOMY    . FISSURECTOMY      Prior to Admission medications   Medication Sig Start Date End Date Taking? Authorizing Provider  furosemide (LASIX) 20 MG tablet Take 20 mg by mouth daily. 07/19/17  Yes [provider]  galantamine (RAZADYNE) 8 MG tablet Take 8 mg by mouth daily.  09/08/17  Yes [provider]  KLOR-CON M10 10 MEQ tablet Take 10 mEq by  mouth 2 (two) times daily.  09/08/17  Yes [provider]  pantoprazole (PROTONIX) 40 MG tablet Take 40 mg by mouth daily. 08/25/17  Yes [provider]  simvastatin (ZOCOR) 20 MG tablet Take 20 mg by mouth at bedtime.  09/14/17  Yes [provider]  tamsulosin (FLOMAX) 0.4 MG CAPS capsule Take 0.4 mg by mouth daily. 09/12/17  Yes [provider]    History reviewed. No pertinent family history.   Social History   Tobacco Use  . Smoking status: Former Research scientist (life sciences)  . Smokeless tobacco: Never Used  Substance Use Topics  . Alcohol use: Not on file  . Drug use: Not on file    Allergies as of 10/09/2017  . (No Known Allergies)    Review of Systems:    All systems reviewed and negative except where noted in HPI.   Physical Exam:  Vital signs in last 24 hours: Temp:  [97.4 F (36.3 C)-99.1 F (37.3 C)] 98.3 F (36.8 C) (06/02 1548) Pulse Rate:  [62-82] 62 (06/02 1548) Resp:  [13-22] 18 (06/02 1548) BP: (86-175)/(61-124) 175/100 (06/02 1548) SpO2:  [92 %-98 %] 96 % (06/02 1548) Weight:  [150 lb (68 kg)-185 lb (83.9 kg)] 185 lb (83.9 kg) (06/02 1548) Last BM Date: 10/09/17 General:   Pleasant, cooperative in NAD Head:  Normocephalic and atraumatic. Eyes:   No icterus.   Conjunctiva pink. PERRLA. Ears:  Normal auditory acuity. Neck:  Supple; no masses or thyroidomegaly Lungs: Respirations even and unlabored. Lungs clear to auscultation bilaterally.   No wheezes, crackles, or rhonchi.  Heart:  Regular rate and rhythm;  Without murmur, clicks, rubs or gallops Abdomen:  Soft, nondistended, nontender. Normal bowel sounds. No appreciable masses or hepatomegaly.  No rebound or guarding.  Rectal:  Not performed. Msk:  Symmetrical without gross deformities.  Strength normal  Extremities:  Without edema, cyanosis or clubbing. Neurologic:  Alert and oriented x3;  grossly normal neurologically. Skin:  Intact without significant lesions or rashes. Psych:  Alert and  cooperative. Normal affect.  LAB RESULTS: CBC Latest Ref Rng & Units 10/09/2017 10/09/2017 10/09/2017  WBC 3.8 - 10.6 K/uL - - 7.6  Hemoglobin 13.0 - 18.0 g/dL 13.0 12.9(L) 14.2  Hematocrit 40.0 - 52.0 % 38.7(L) 38.0(L) 41.4  Platelets 150 - 440 K/uL - - 319    BMET BMP Latest Ref Rng & Units 10/09/2017 11/06/2015  Glucose 65 - 99 mg/dL 108(H) 93  BUN 6 - 20 mg/dL 21(H) 17  Creatinine 0.61 - 1.24 mg/dL 1.76(H) 1.42(H)  Sodium 135 - 145 mmol/L 138 135  Potassium 3.5 - 5.1 mmol/L 3.8 3.8  Chloride 101 - 111 mmol/L 106 101  CO2 22 - 32 mmol/L 22 27  Calcium 8.9 - 10.3 mg/dL 8.8(L) 8.7(L)    LFT Hepatic Function Latest Ref Rng & Units 10/09/2017 11/06/2015  Total Protein 6.5 - 8.1 g/dL 6.8 6.9  Albumin 3.5 - 5.0 g/dL 4.0 4.1  AST 15 - 41 U/L 35 31  ALT 17 - 63 U/L 27 21  Alk Phosphatase 38 - 126 U/L 67 66  Total Bilirubin 0.3 - 1.2 mg/dL 0.6 0.3     STUDIES: No results found.    Impression / Plan:   Paul Bryan is a 84 y.o. male with history of hypertension, restless leg syndrome, presents with one-the history of several episodes of rectal bleeding with hypotension. Patient is currently hemodynamically stable and no longer bleeding. Hemoglobin has been stable since admission Most likely lower GI bleed, diverticular or hemorrhoidal. Less likely brisk upper GI bleed   - Continue pantoprazole drip - Monitor hemoglobin closely and for recurrence of rectal bleeding - ferritin is low, recommend IV iron, feraheme ordered - Clear liquid diet - Bowel prep today - Nothing by mouth past midnight - EGD and colonoscopy tomorrow  I have discussed alternative options, risks & benefits,  which include, but are not limited to, bleeding, infection, perforation,respiratory complication & drug reaction.  The patient agrees with this plan & written consent will be obtained.    Thank you for involving me in the care of this patient.  Dr. Vicente Males to cover from tomorrow    LOS: 0 days   Sherri Sear, MD  10/09/2017, 4:41 PM   Note: This dictation was prepared with Dragon dictation along with smaller phrase technology. Any transcriptional errors that result from this process are unintentional.

## 2017-10-09 NOTE — ED Notes (Signed)
Dr Diamond at bedside. 

## 2017-10-09 NOTE — Progress Notes (Addendum)
Pt has been sitting in chair , up ambulatory in room No BMs this shift, using  urinal to void, VSS, Hgb stable  To floor and NPO after midnight for upper and lower colonoscopy in am. Report called to [facility] on floor, family aware of plan and verbalize agreement

## 2017-10-10 ENCOUNTER — Inpatient Hospital Stay: Payer: Medicare Other | Admitting: Certified Registered Nurse Anesthetist

## 2017-10-10 ENCOUNTER — Encounter: Payer: Self-pay | Admitting: Certified Registered Nurse Anesthetist

## 2017-10-10 ENCOUNTER — Encounter
Admission: EM | Disposition: A | Discharge status: Home / Self Care | Payer: Self-pay | Source: Home / Self Care | Attending: Internal Medicine

## 2017-10-10 DIAGNOSIS — K5730 Diverticulosis of large intestine without perforation or abscess without bleeding: Secondary | ICD-10-CM

## 2017-10-10 DIAGNOSIS — K449 Diaphragmatic hernia without obstruction or gangrene: Secondary | ICD-10-CM

## 2017-10-10 DIAGNOSIS — K922 Gastrointestinal hemorrhage, unspecified: Secondary | ICD-10-CM | POA: Diagnosis not present

## 2017-10-10 DIAGNOSIS — K625 Hemorrhage of anus and rectum: Secondary | ICD-10-CM

## 2017-10-10 DIAGNOSIS — K5731 Diverticulosis of large intestine without perforation or abscess with bleeding: Secondary | ICD-10-CM | POA: Diagnosis not present

## 2017-10-10 DIAGNOSIS — K921 Melena: Secondary | ICD-10-CM | POA: Diagnosis not present

## 2017-10-10 HISTORY — PX: ESOPHAGOGASTRODUODENOSCOPY: SHX5428

## 2017-10-10 HISTORY — PX: COLONOSCOPY: SHX5424

## 2017-10-10 LAB — HEMOGLOBIN AND HEMATOCRIT, BLOOD
HCT: 37.6 % — ABNORMAL LOW (ref 40.0–52.0)
HEMATOCRIT: 36.8 % — AB (ref 40.0–52.0)
HEMOGLOBIN: 12.5 g/dL — AB (ref 13.0–18.0)
Hemoglobin: 13 g/dL (ref 13.0–18.0)

## 2017-10-10 LAB — GLUCOSE, CAPILLARY: Glucose-Capillary: 112 mg/dL — ABNORMAL HIGH (ref 65–99)

## 2017-10-10 SURGERY — EGD (ESOPHAGOGASTRODUODENOSCOPY)
Anesthesia: General

## 2017-10-10 MED ORDER — PROPOFOL 500 MG/50ML IV EMUL
INTRAVENOUS | Status: AC
Start: 2017-10-10 — End: ?
  Filled 2017-10-10: qty 50

## 2017-10-10 MED ORDER — LIDOCAINE HCL (PF) 2 % IJ SOLN
INTRAMUSCULAR | Status: AC
  Filled 2017-10-10: qty 10

## 2017-10-10 MED ORDER — PROPOFOL 500 MG/50ML IV EMUL
INTRAVENOUS | Status: DC | PRN
  Administered 2017-10-10: 130 ug/kg/min via INTRAVENOUS

## 2017-10-10 MED ORDER — LIDOCAINE HCL (CARDIAC) PF 100 MG/5ML IV SOSY
PREFILLED_SYRINGE | INTRAVENOUS | Status: DC | PRN
  Administered 2017-10-10: 40 mg via INTRAVENOUS

## 2017-10-10 MED ORDER — PROPOFOL 10 MG/ML IV BOLUS
INTRAVENOUS | Status: DC | PRN
  Administered 2017-10-10: 70 mg via INTRAVENOUS

## 2017-10-10 NOTE — Discharge Instructions (Signed)
Rectal Bleeding °Rectal bleeding is when blood comes out of the opening of the butt (anus). People with this kind of bleeding may notice bright red blood in their underwear or in the toilet after they poop (have a bowel movement). They may also have dark red or black poop (stool). Rectal bleeding is often a sign that something is wrong. It needs to be checked by a doctor. °Follow these instructions at home: °Watch for any changes in your condition. Take these actions to help with bleeding and discomfort: °· Eat a diet that is high in fiber. This will keep your poop soft so it is easier for you to poop without pushing too hard. Ask your doctor to tell you what foods and drinks are high in fiber. °· Drink enough fluid to keep your pee (urine) clear or pale yellow. This also helps keep your poop soft. °· Try taking a warm bath. This may help with pain. °· Keep all follow-up visits as told by your doctor. This is important. ° °Get help right away if: °· You have new bleeding. °· You have more bleeding than before. °· You have black or dark red poop. °· You throw up (vomit) blood or something that looks like coffee grounds. °· You have pain or tenderness in your belly (abdomen). °· You have a fever. °· You feel weak. °· You feel sick to your stomach (nauseous). °· You pass out (faint). °· You have very bad pain in your butt. °· You cannot poop. °This information is not intended to replace advice given to you by your health care provider. Make sure you discuss any questions you have with your health care provider. °Document Released: 01/06/2011 Document Revised: 10/02/2015 Document Reviewed: 06/22/2015 °Elsevier Interactive Patient Education © 2018 Elsevier Inc. ° °

## 2017-10-10 NOTE — Discharge Summary (Signed)
[facility] [place] [facility]   PATIENT NAME: Paul Bryan    MR#:  [id number]  DATE OF BIRTH:  May 10, 1930  DATE OF ADMISSION:  10/09/2017 ADMITTING PHYSICIAN: Arnaldo Natal, MD  DATE OF DISCHARGE: 10/10/2017  PRIMARY CARE PHYSICIAN: Kandyce Rud, MD    ADMISSION DIAGNOSIS:  Lower GI bleed [K92.2] GIB (gastrointestinal bleeding) [K92.2]  DISCHARGE DIAGNOSIS:  Active Problems:   GIB (gastrointestinal bleeding)   SECONDARY DIAGNOSIS:   Past Medical History:  Diagnosis Date  . Hypertension   . Restless leg syndrome     HOSPITAL COURSE:   84 year old male with history of essential hypertension who presented to the emergency room due to rectal bleeding.  1.  Rectal bleeding: Patient underwent endoscopy and colonoscopy.  Endoscopy showed no evidence of ulcers or bleeding.  Colonoscopy showed diverticulosis.  It is likely that the rectal bleeding was due to diverticular bleed.  His hemoglobin remained stable.  He did not require blood transfusion while in the hospital.  2.  Essential hypertension: Patient will continue on Lasix  3.  Dementia: Patient will continue on galantamine  4.  BPH: Continue Flomax  5.  Hyperlipidemia: Continue statin  DISCHARGE CONDITIONS AND DIET:   Stable for discharge on regular diet  CONSULTS OBTAINED:    DRUG ALLERGIES:  No Known Allergies  DISCHARGE MEDICATIONS:   Allergies as of 10/10/2017   No Known Allergies     Medication List    TAKE these medications   furosemide 20 MG tablet Commonly known as:  LASIX Take 20 mg by mouth daily.   galantamine 8 MG tablet Commonly known as:  RAZADYNE Take 8 mg by mouth daily.   KLOR-CON M10 10 MEQ tablet Generic drug:  potassium chloride Take 10 mEq by mouth 2 (two) times daily.   pantoprazole 40 MG tablet Commonly known as:  PROTONIX Take 40 mg by mouth daily.   simvastatin 20 MG tablet Commonly known as:  ZOCOR Take 20 mg by mouth at bedtime.    tamsulosin 0.4 MG Caps capsule Commonly known as:  FLOMAX Take 0.4 mg by mouth daily.         Today   CHIEF COMPLAINT:    doing well.  No acute events overnight.  No rectal bleeding noted   VITAL SIGNS:  Blood pressure (!) 188/70, pulse 80, temperature (!) 97 F (36.1 C), temperature source Oral, resp. rate 18, height 5\' 7"  (1.702 m), weight 83.6 kg (184 lb 6.4 oz), SpO2 96 %.   REVIEW OF SYSTEMS:  Review of Systems  Constitutional: Negative.  Negative for chills, fever and malaise/fatigue.  HENT: Negative.  Negative for ear discharge, ear pain, hearing loss, nosebleeds and sore throat.   Eyes: Negative.  Negative for blurred vision and pain.  Respiratory: Negative.  Negative for cough, hemoptysis, shortness of breath and wheezing.   Cardiovascular: Negative.  Negative for chest pain, palpitations and leg swelling.  Gastrointestinal: Negative.  Negative for abdominal pain, blood in stool, diarrhea, nausea and vomiting.  Genitourinary: Negative.  Negative for dysuria.  Musculoskeletal: Negative.  Negative for back pain.  Skin: Negative.   Neurological: Negative for dizziness, tremors, speech change, focal weakness, seizures and headaches.  Endo/Heme/Allergies: Negative.  Does not bruise/bleed easily.  Psychiatric/Behavioral: Positive for memory loss. Negative for depression, hallucinations and suicidal ideas.     PHYSICAL EXAMINATION:  GENERAL:  84 y.o.-year-old patient lying in the bed with no acute distress.  NECK:  Supple, no jugular venous distention. No thyroid  enlargement, no tenderness.  LUNGS: Normal breath sounds bilaterally, no wheezing, rales,rhonchi  No use of accessory muscles of respiration.  CARDIOVASCULAR: S1, S2 normal. No murmurs, rubs, or gallops.  ABDOMEN: Soft, non-tender, non-distended. Bowel sounds present. No organomegaly or mass.  EXTREMITIES: No pedal edema, cyanosis, or clubbing.  PSYCHIATRIC: The patient is alert and oriented x name  place SKIN: No obvious rash, lesion, or ulcer.   DATA REVIEW:   CBC Recent Labs  Lab 10/09/17 0013  10/10/17 0754  WBC 7.6  --   --   HGB 14.2   < > 13.0  HCT 41.4   < > 37.6*  PLT 319  --   --    < > = values in this interval not displayed.    Chemistries  Recent Labs  Lab 10/09/17 0013  NA 138  K 3.8  CL 106  CO2 22  GLUCOSE 108*  BUN 21*  CREATININE 1.76*  CALCIUM 8.8*  AST 35  ALT 27  ALKPHOS 67  BILITOT 0.6    Cardiac Enzymes No results for input(s): TROPONINI in the last 168 hours.  Microbiology Results  @[id number] @  RADIOLOGY:  No results found.    Allergies as of 10/10/2017   No Known Allergies     Medication List    TAKE these medications   furosemide 20 MG tablet Commonly known as:  LASIX Take 20 mg by mouth daily.   galantamine 8 MG tablet Commonly known as:  RAZADYNE Take 8 mg by mouth daily.   KLOR-CON M10 10 MEQ tablet Generic drug:  potassium chloride Take 10 mEq by mouth 2 (two) times daily.   pantoprazole 40 MG tablet Commonly known as:  PROTONIX Take 40 mg by mouth daily.   simvastatin 20 MG tablet Commonly known as:  ZOCOR Take 20 mg by mouth at bedtime.   tamsulosin 0.4 MG Caps capsule Commonly known as:  FLOMAX Take 0.4 mg by mouth daily.          Management plans discussed with the patient and daughter in law and she  is in agreement. Stable for discharge home  Patient should follow up with pcp  CODE STATUS:     Code Status Orders  (From admission, onward)        Start     Ordered   10/09/17 0400  Full code  Continuous     10/09/17 0359    Code Status History    This patient has a current code status but no historical code status.    Advance Directive Documentation     Most Recent Value  Type of Advance Directive  Healthcare Power of Attorney, Living will  Pre-existing out of facility DNR order (yellow form or pink MOST form)  -  "MOST" Form in Place?  -      TOTAL TIME TAKING CARE OF  THIS PATIENT: full minutes.    Note: This dictation was prepared with Dragon dictation along with smaller phrase technology. Any transcriptional errors that result from this process are unintentional.  Clovia Reine M.D on 10/10/2017 at 12:12 PM  Between 7am to 6pm - Pager - [phone] After 6pm go to [email or web address] - Social research officer, governmentpassword [email or web address]  [facility] [place] [facility]  Office  [phone]  CC: Primary care physician; Kandyce RudBabaoff, Marcus, MD

## 2017-10-10 NOTE — Progress Notes (Signed)
Pt confused, impulsive, pulling at lines, and verbally aggressive towards staff when redirected. Notified MD Anne HahnWillis and order placed for safety sitter at bedside as well as to discontinue telemetry. Nursing staff will continue to monitor for any changes in patient status. Lamonte RicherKara A Ricard Faulkner, RN

## 2017-10-10 NOTE — Op Note (Signed)
[facility] Gastroenterology Patient Name: Paul Bryan Procedure Date: 10/10/2017 9:22 AM MRN: [id number] Account #: [id number] Date of Birth: December 12, 1929 Admit Type: Inpatient Age: 84 Room: [facility] END[facility] ROOM 4 Gender: Male Note Status: Finalized Procedure:            Upper GI endoscopy Indications:          Hematochezia Providers:            Wyline MoodKiran Noeh Sparacino MD, MD Medicines:            Monitored Anesthesia Care Complications:        No immediate complications. Procedure:            Pre-Anesthesia Assessment:                       - Prior to the procedure, a History and Physical was                        performed, and patient medications, allergies and                        sensitivities were reviewed. The patient's tolerance of                        previous anesthesia was reviewed.                       - The risks and benefits of the procedure and the                        sedation options and risks were discussed with the                        patient. All questions were answered and informed                        consent was obtained.                       - ASA Grade Assessment: III - A patient with severe                        systemic disease.                       After obtaining informed consent, the endoscope was                        passed under direct vision. Throughout the procedure,                        the patient's blood pressure, pulse, and oxygen                        saturations were monitored continuously. The                        Colonoscope was introduced through the mouth, and                        advanced to the third part of duodenum. The upper GI  endoscopy was accomplished with ease. The patient                        tolerated the procedure well. Findings:      The examined duodenum was normal.      The esophagus was normal.      A 5 cm hiatal hernia was present.      The cardia and gastric fundus were  normal on retroflexion. Impression:           - Normal examined duodenum.                       - Normal esophagus.                       - 5 cm hiatal hernia.                       - No specimens collected. Recommendation:       - Perform a colonoscopy today. Procedure Code(s):    --- Professional ---                       [phone], Esophagogastroduodenoscopy, flexible, transoral;                        diagnostic, including collection of specimen(s) by                        brushing or washing, when performed (separate procedure) Diagnosis Code(s):    --- Professional ---                       K44.9, Diaphragmatic hernia without obstruction or                        gangrene                       K92.1, Melena (includes Hematochezia) CPT copyright 2017 American Medical Association. All rights reserved. The codes documented in this report are preliminary and upon coder review may  be revised to meet current compliance requirements. Wyline Mood, MD Wyline Mood MD, MD 10/10/2017 9:38:56 AM This report has been signed electronically. Number of Addenda: 0 Note Initiated On: 10/10/2017 9:22 AM      [facility]

## 2017-10-10 NOTE — Anesthesia Post-op Follow-up Note (Signed)
Anesthesia QCDR form completed.        

## 2017-10-10 NOTE — Anesthesia Preprocedure Evaluation (Signed)
Anesthesia Evaluation  Patient identified by MRN, date of birth, ID band Patient awake    Reviewed: Allergy & Precautions, H&P , NPO status , Patient's Chart, lab work & pertinent test results, reviewed documented beta blocker date and time   Airway Mallampati: II   Neck ROM: full    Dental  (+) Poor Dentition, Teeth Intact   Pulmonary neg pulmonary ROS, former smoker,    Pulmonary exam normal        Cardiovascular hypertension, negative cardio ROS Normal cardiovascular exam Rhythm:regular Rate:Normal     Neuro/Psych negative neurological ROS  negative psych ROS   GI/Hepatic negative GI ROS, Neg liver ROS,   Endo/Other  negative endocrine ROS  Renal/GU negative Renal ROS  negative genitourinary   Musculoskeletal   Abdominal   Peds  Hematology negative hematology ROS (+)   Anesthesia Other Findings Past Medical History: No date: Hypertension No date: Restless leg syndrome Past Surgical History: No date: ABLATION No date: APPENDECTOMY No date: FISSURECTOMY BMI    Body Mass Index:  28.88 kg/m     Reproductive/Obstetrics negative OB ROS                             Anesthesia Physical Anesthesia Plan  ASA: III  Anesthesia Plan: General   Post-op Pain Management:    Induction:   PONV Risk Score and Plan:   Airway Management Planned:   Additional Equipment:   Intra-op Plan:   Post-operative Plan:   Informed Consent: I have reviewed the patients History and Physical, chart, labs and discussed the procedure including the risks, benefits and alternatives for the proposed anesthesia with the patient or authorized representative who has indicated his/her understanding and acceptance.   Dental Advisory Given  Plan Discussed with: CRNA  Anesthesia Plan Comments:         Anesthesia Quick Evaluation

## 2017-10-10 NOTE — H&P (Signed)
Wyline MoodKiran Najiyah Paris, MD [address], [address], [place], [place], [address] [address], [address], [place], [place], [address] Phone: [phone]  Fax: [phone]  Primary Care Physician:  System, Pcp Not In   Pre-Procedure History & Physical: HPI:  Paul RanStowe N Bryan is a 84 y.o. male is here for an endoscopy and colonoscopy    Past Medical History:  Diagnosis Date  . Hypertension   . Restless leg syndrome     Past Surgical History:  Procedure Laterality Date  . ABLATION    . APPENDECTOMY    . FISSURECTOMY      Prior to Admission medications   Medication Sig Start Date End Date Taking? Authorizing Provider  furosemide (LASIX) 20 MG tablet Take 20 mg by mouth daily. 07/19/17  Yes [provider]  galantamine (RAZADYNE) 8 MG tablet Take 8 mg by mouth daily.  09/08/17  Yes [provider]  KLOR-CON M10 10 MEQ tablet Take 10 mEq by mouth 2 (two) times daily.  09/08/17  Yes [provider]  pantoprazole (PROTONIX) 40 MG tablet Take 40 mg by mouth daily. 08/25/17  Yes [provider]  simvastatin (ZOCOR) 20 MG tablet Take 20 mg by mouth at bedtime.  09/14/17  Yes [provider]  tamsulosin (FLOMAX) 0.4 MG CAPS capsule Take 0.4 mg by mouth daily. 09/12/17  Yes [provider]    Allergies as of 10/09/2017  . (No Known Allergies)    History reviewed. No pertinent family history.  Social History   Socioeconomic History  . Marital status: Widowed    Spouse name: Not on file  . Number of children: Not on file  . Years of education: Not on file  . Highest education level: Not on file  Occupational History  . Occupation: retired  Engineer, productionocial Needs  . Financial resource strain: Not hard at all  . Food insecurity:    Worry: Never true    Inability: Never true  . Transportation needs:    Medical: No    Non-medical: No  Tobacco Use  . Smoking status: Former Games developermoker  . Smokeless tobacco: Never Used  Substance and Sexual Activity  .  Alcohol use: Not on file  . Drug use: Not on file  . Sexual activity: Not Currently  Lifestyle  . Physical activity:    Days per week: 0 days    Minutes per session: 0 min  . Stress: Not at all  Relationships  . Social connections:    Talks on phone: More than three times a week    Gets together: More than three times a week    Attends religious service: Never    Active member of club or organization: Patient refused    Attends meetings of clubs or organizations: Patient refused    Relationship status: Widowed  . Intimate partner violence:    Fear of current or ex partner: No    Emotionally abused: No    Physically abused: No    Forced sexual activity: No  Other Topics Concern  . Not on file  Social History Narrative   Wife died in Jan , move dup here to live with son    Review of Systems: See HPI, otherwise negative ROS  Physical Exam: BP (!) 171/82   Pulse 60   Temp 97.7 F (36.5 C) (Tympanic)   Resp 20   Ht 5\' 7"  (1.702 m)   Wt 184 lb 6.4 oz (83.6 kg)   SpO2 100%  BMI 28.88 kg/m  General:   Alert,  pleasant and cooperative in NAD Head:  Normocephalic and atraumatic. Neck:  Supple; no masses or thyromegaly. Lungs:  Clear throughout to auscultation, normal respiratory effort.    Heart:  +S1, +S2, Regular rate and rhythm, No edema. Abdomen:  Soft, nontender and nondistended. Normal bowel sounds, without guarding, and without rebound.   Neurologic:  Alert and  oriented x1;    Impression/Plan: Paul Bryan is here for an endoscopy and colonoscopy  to be performed for  evaluation of rectal bleeding and iron deficiency.     Risks, benefits, limitations, and alternatives regarding endoscopy have been reviewed with the patient.  Questions have been answered.  All parties agreeable.   Wyline Mood, MD  10/10/2017, 9:23 AM

## 2017-10-10 NOTE — Progress Notes (Signed)
Likely diverticular bleed. No active bleeding . Advance diet , if Hb stable then can be discharged.   I will sign off.  Please call me if any further GI concerns or questions.  We would like to thank you for the opportunity to participate in the care of [name].   Dr Wyline MoodKiran Fredda Clarida MD,MRCP [facility] Gastro[facility]ology/Hepatology Pager: [phone]

## 2017-10-10 NOTE — Op Note (Signed)
[facility] Gastroenterology Patient Name: Paul Bryan Procedure Date: 10/10/2017 9:21 AM MRN: [id number] Account #: [id number] Date of Birth: [birth year] Admit Type: Inpatient Age: 84 [facility]OM 4 Gender: Male Note Status: Finalized Procedure:            Colonoscopy Indications:          Rectal bleeding Providers:            Wyline MoodKiran Brook Geraci MD, MD Medicines:            Monitored Anesthesia Care Complications:        No immediate complications. Procedure:            Pre-Anesthesia Assessment:                       - Prior to the procedure, a History and Physical was                        performed, and patient medications, allergies and                        sensitivities were reviewed. The patient's tolerance of                        previous anesthesia was reviewed.                       - The risks and benefits of the procedure and the                        sedation options and risks were discussed with the                        patient. All questions were answered and informed                        consent was obtained.                       - ASA Grade Assessment: III - A patient with severe                        systemic disease.                       After obtaining informed consent, the colonoscope was                        passed under direct vision. Throughout the procedure,                        the patient's blood pressure, pulse, and oxygen                        saturations were monitored continuously. The                        Colonoscope was introduced through the anus and                        advanced to the the cecum, identified by the  appendiceal orifice, IC valve and transillumination.                        The colonoscopy was performed with ease. The patient                        tolerated the procedure well. The quality of the bowel                        preparation was poor. Findings:      The  perianal and digital rectal examinations were normal.      Multiple small and large-mouthed diverticula were found in the left       colon.      One large localized angioectasia without bleeding was found in the       cecum. not treated due to poor bowel prep and risk of perforation.      copious quantities of semi-liquid stool was found in the entire colon ,       no fresh or old blood seen      The exam was otherwise without abnormality on direct and retroflexion       views. Impression:           - Preparation of the colon was poor.                       - Diverticulosis in the left colon.                       - One non-bleeding colonic angioectasia.                       - Stool in the entire examined colon.                       - The examination was otherwise normal on direct and                        retroflexion views.                       - No specimens collected. Recommendation:       - Return patient to hospital ward for ongoing care.                       - Advance diet as tolerated.                       - Continue present medications.                       - Poor bowel prep- inadequate to detect polyps.                       Single non bleeding AVM seen in the Cecum (unlikely                        related to present episode of rectal bleeding) did not                        treat due to poor bowel prep .  Likely diverticular bleed.                       Advance diet and if tolerates and Hb stable can be                        discharged. Procedure Code(s):    --- Professional ---                       [phone], Colonoscopy, flexible; diagnostic, including                        collection of specimen(s) by brushing or washing, when                        performed (separate procedure) Diagnosis Code(s):    --- Professional ---                       A21.30, Angiodysplasia of colon without hemorrhage                       K62.5, Hemorrhage of anus and  rectum                       K57.30, Diverticulosis of large intestine without                        perforation or abscess without bleeding CPT copyright 2017 American Medical Association. All rights reserved. The codes documented in this report are preliminary and upon coder review may  be revised to meet current compliance requirements. Wyline Mood, MD Wyline Mood MD, MD 10/10/2017 9:54:00 AM This report has been signed electronically. Number of Addenda: 0 Note Initiated On: 10/10/2017 9:21 AM Scope Withdrawal Time: 0 hours 5 minutes 7 seconds  Total Procedure Duration: 0 hours 9 minutes 4 seconds       [facility]

## 2017-10-10 NOTE — Anesthesia Postprocedure Evaluation (Signed)
Anesthesia Post Note  Patient: Paul Bryan  Procedure(s) Performed: ESOPHAGOGASTRODUODENOSCOPY (EGD) (N/A ) COLONOSCOPY (N/A )  Patient location during evaluation: PACU Anesthesia Type: General Level of consciousness: awake and alert Pain management: pain level controlled Vital Signs Assessment: post-procedure vital signs reviewed and stable Respiratory status: spontaneous breathing, nonlabored ventilation, respiratory function stable and patient connected to nasal cannula oxygen Cardiovascular status: blood pressure returned to baseline and stable Postop Assessment: no apparent nausea or vomiting Anesthetic complications: no     Last Vitals:  Vitals:   10/10/17 0957 10/10/17 1047  BP: (!) 150/77 (!) 188/70  Pulse:  80  Resp: 16 18  Temp: (!) 36.1 C   SpO2: 96%     Last Pain:  Vitals:   10/10/17 1047  TempSrc:   PainSc: 0-No pain                 Yevette EdwardsJames G Enyah Moman

## 2017-10-10 NOTE — Transfer of Care (Signed)
Immediate Anesthesia Transfer of Care Note  Patient: Paul Bryan  Procedure(s) Performed: ESOPHAGOGASTRODUODENOSCOPY (EGD) (N/A ) COLONOSCOPY (N/A )  Patient Location: PACU and Endoscopy Unit  Anesthesia Type:General  Level of Consciousness: drowsy  Airway & Oxygen Therapy: Patient Spontanous Breathing  Post-op Assessment: Report given to RN and Post -op Vital signs reviewed and stable  Post vital signs: Reviewed and stable  Last Vitals:  Vitals Value Taken Time  BP    Temp    Pulse 59 10/10/2017  9:56 AM  Resp 15 10/10/2017  9:56 AM  SpO2 96 % 10/10/2017  9:56 AM  Vitals shown include unvalidated device data.  Last Pain:  Vitals:   10/10/17 0912  TempSrc: Tympanic  PainSc:       Patients Stated Pain Goal: 0 (10/09/17 0800)  Complications: No apparent anesthesia complications

## 2017-10-12 LAB — BPAM RBC
Blood Product Expiration Date: 201906302359
Blood Product Expiration Date: 201906302359
ISSUE DATE / TIME: 201906020415
Unit Type and Rh: 5100
Unit Type and Rh: 5100

## 2017-10-12 LAB — TYPE AND SCREEN
ABO/RH(D): O POS
ANTIBODY SCREEN: NEGATIVE
UNIT DIVISION: 0
Unit division: 0

## 2017-10-12 LAB — PREPARE RBC (CROSSMATCH)

## 2017-10-13 ENCOUNTER — Encounter: Payer: Self-pay | Admitting: Gastroenterology

## 2017-10-13 ENCOUNTER — Telehealth: Payer: Self-pay

## 2017-10-13 NOTE — Telephone Encounter (Signed)
Flagged on EMMI report for not knowing who to call about changes in condition.  Called and spoke with patient and daughter-in-law, Sherry RuffingCherri.  They are aware to call Dr. Pilar PlateBabaoff's office for any issues.  Cherri stated they are there now for follow up appointment.  I thanked them for their time and informed them they would receive one more automated call in the next few days as a final check-in.

## 2017-10-18 ENCOUNTER — Telehealth: Payer: Self-pay

## 2017-10-18 NOTE — Telephone Encounter (Signed)
Follow-up from 2nd automated call: Mr. Paul Bryan left me a voice message that he had received a call from my number and that everything was going well.  No needs noted.

## 2019-02-04 ENCOUNTER — Inpatient Hospital Stay
Admission: EM | Admit: 2019-02-04 | Discharge: 2019-02-08 | DRG: 871 | Disposition: A | Discharge status: Other Acute Inpatient Hospital | Payer: Medicare Other | Attending: Internal Medicine | Admitting: Internal Medicine

## 2019-02-04 ENCOUNTER — Emergency Department: Payer: Medicare Other

## 2019-02-04 ENCOUNTER — Other Ambulatory Visit: Payer: Self-pay

## 2019-02-04 DIAGNOSIS — Z79891 Long term (current) use of opiate analgesic: Secondary | ICD-10-CM

## 2019-02-04 DIAGNOSIS — N179 Acute kidney failure, unspecified: Secondary | ICD-10-CM | POA: Diagnosis present

## 2019-02-04 DIAGNOSIS — Z20828 Contact with and (suspected) exposure to other viral communicable diseases: Secondary | ICD-10-CM | POA: Diagnosis present

## 2019-02-04 DIAGNOSIS — G9341 Metabolic encephalopathy: Secondary | ICD-10-CM | POA: Diagnosis present

## 2019-02-04 DIAGNOSIS — G2581 Restless legs syndrome: Secondary | ICD-10-CM | POA: Diagnosis present

## 2019-02-04 DIAGNOSIS — G301 Alzheimer's disease with late onset: Secondary | ICD-10-CM | POA: Diagnosis present

## 2019-02-04 DIAGNOSIS — J189 Pneumonia, unspecified organism: Secondary | ICD-10-CM | POA: Diagnosis present

## 2019-02-04 DIAGNOSIS — K219 Gastro-esophageal reflux disease without esophagitis: Secondary | ICD-10-CM | POA: Diagnosis present

## 2019-02-04 DIAGNOSIS — N183 Chronic kidney disease, stage 3 unspecified: Secondary | ICD-10-CM | POA: Diagnosis present

## 2019-02-04 DIAGNOSIS — Z23 Encounter for immunization: Secondary | ICD-10-CM

## 2019-02-04 DIAGNOSIS — Z7982 Long term (current) use of aspirin: Secondary | ICD-10-CM

## 2019-02-04 DIAGNOSIS — A419 Sepsis, unspecified organism: Principal | ICD-10-CM | POA: Diagnosis present

## 2019-02-04 DIAGNOSIS — R4182 Altered mental status, unspecified: Secondary | ICD-10-CM | POA: Diagnosis present

## 2019-02-04 DIAGNOSIS — N400 Enlarged prostate without lower urinary tract symptoms: Secondary | ICD-10-CM | POA: Diagnosis present

## 2019-02-04 DIAGNOSIS — Z79899 Other long term (current) drug therapy: Secondary | ICD-10-CM

## 2019-02-04 DIAGNOSIS — I129 Hypertensive chronic kidney disease with stage 1 through stage 4 chronic kidney disease, or unspecified chronic kidney disease: Secondary | ICD-10-CM | POA: Diagnosis present

## 2019-02-04 DIAGNOSIS — E785 Hyperlipidemia, unspecified: Secondary | ICD-10-CM | POA: Diagnosis present

## 2019-02-04 DIAGNOSIS — Z87891 Personal history of nicotine dependence: Secondary | ICD-10-CM

## 2019-02-04 DIAGNOSIS — F0281 Dementia in other diseases classified elsewhere with behavioral disturbance: Secondary | ICD-10-CM | POA: Diagnosis present

## 2019-02-04 LAB — URINALYSIS, COMPLETE (UACMP) WITH MICROSCOPIC
Bacteria, UA: NONE SEEN
Bilirubin Urine: NEGATIVE
Glucose, UA: NEGATIVE mg/dL
Hgb urine dipstick: NEGATIVE
Ketones, ur: NEGATIVE mg/dL
Leukocytes,Ua: NEGATIVE
Nitrite: NEGATIVE
Protein, ur: 100 mg/dL — AB
Specific Gravity, Urine: 1.012 (ref 1.005–1.030)
Squamous Epithelial / LPF: NONE SEEN (ref 0–5)
pH: 5 (ref 5.0–8.0)

## 2019-02-04 LAB — CBC WITH DIFFERENTIAL/PLATELET
Abs Immature Granulocytes: 0.04 10*3/uL (ref 0.00–0.07)
Basophils Absolute: 0.1 10*3/uL (ref 0.0–0.1)
Basophils Relative: 1 %
Eosinophils Absolute: 0.2 10*3/uL (ref 0.0–0.5)
Eosinophils Relative: 3 %
HCT: 43.2 % (ref 39.0–52.0)
Hemoglobin: 14.3 g/dL (ref 13.0–17.0)
Immature Granulocytes: 1 %
Lymphocytes Relative: 23 %
Lymphs Abs: 1.8 10*3/uL (ref 0.7–4.0)
MCH: 28.3 pg (ref 26.0–34.0)
MCHC: 33.1 g/dL (ref 30.0–36.0)
MCV: 85.5 fL (ref 80.0–100.0)
Monocytes Absolute: 1.1 10*3/uL — ABNORMAL HIGH (ref 0.1–1.0)
Monocytes Relative: 14 %
Neutro Abs: 4.6 10*3/uL (ref 1.7–7.7)
Neutrophils Relative %: 58 %
Platelets: 307 10*3/uL (ref 150–400)
RBC: 5.05 MIL/uL (ref 4.22–5.81)
RDW: 14.6 % (ref 11.5–15.5)
WBC: 7.8 10*3/uL (ref 4.0–10.5)
nRBC: 0 % (ref 0.0–0.2)

## 2019-02-04 LAB — LACTIC ACID, PLASMA: Lactic Acid, Venous: 1.8 mmol/L (ref 0.5–1.9)

## 2019-02-04 LAB — BASIC METABOLIC PANEL
Anion gap: 12 (ref 5–15)
BUN: 23 mg/dL (ref 8–23)
CO2: 23 mmol/L (ref 22–32)
Calcium: 9.1 mg/dL (ref 8.9–10.3)
Chloride: 102 mmol/L (ref 98–111)
Creatinine, Ser: 1.81 mg/dL — ABNORMAL HIGH (ref 0.61–1.24)
GFR calc Af Amer: 38 mL/min — ABNORMAL LOW (ref >60)
GFR calc non Af Amer: 33 mL/min — ABNORMAL LOW (ref >60)
Glucose, Bld: 104 mg/dL — ABNORMAL HIGH (ref 70–99)
Potassium: 3.7 mmol/L (ref 3.5–5.1)
Sodium: 137 mmol/L (ref 135–145)

## 2019-02-04 LAB — TROPONIN I (HIGH SENSITIVITY): Troponin I (High Sensitivity): 8 ng/L (ref <18)

## 2019-02-04 MED ORDER — LORAZEPAM 2 MG/ML IJ SOLN
1.0000 mg | Freq: Once | INTRAMUSCULAR | Status: AC
  Administered 2019-02-04: 1 mg via INTRAVENOUS
  Filled 2019-02-04: qty 1

## 2019-02-04 MED ORDER — HALOPERIDOL LACTATE 5 MG/ML IJ SOLN
5.0000 mg | Freq: Once | INTRAMUSCULAR | Status: AC
  Administered 2019-02-04: 5 mg via INTRAVENOUS
  Filled 2019-02-04: qty 1

## 2019-02-04 MED ORDER — LORAZEPAM 2 MG/ML IJ SOLN
INTRAMUSCULAR | Status: AC
  Administered 2019-02-04: 2 mg via INTRAVENOUS
  Filled 2019-02-04: qty 1

## 2019-02-04 MED ORDER — LORAZEPAM 2 MG/ML IJ SOLN
2.0000 mg | Freq: Once | INTRAMUSCULAR | Status: AC
  Administered 2019-02-04: 22:00:00 2 mg via INTRAVENOUS

## 2019-02-04 NOTE — ED Provider Notes (Signed)
-----------------------------------------   11:08 PM on 02/04/2019 -----------------------------------------  Blood pressure (!) 175/88, pulse 71, temperature 97.8 F (36.6 C), resp. rate 17, height 5\' 8"  (1.727 m), weight 81.6 kg, SpO2 95 %.  Assuming care from Dr. Archie Balboa.  In short, Paul Bryan is a 85 y.o. male with a chief complaint of Altered Mental Status .  Refer to the original H&P for additional details.  The current plan of care is to follow-up urine and COVID results, will likely require admission for AMS.  Patient noted to have elevated lactate, UA negative for infection however chest x-ray concerning for multifocal pneumonia.  Will treat patient for community-acquired pneumonia and draw blood cultures.  COVID testing is negative.  Case discussed with hospitalist, who accepts patient for admission.    Blake Divine, MD 02/05/19 [phone]

## 2019-02-04 NOTE — ED Notes (Signed)
Pt atempting to remove PIV. Coband placed on left AC by this RN

## 2019-02-04 NOTE — ED Provider Notes (Addendum)
[facility] Emergency Department Provider Note   ____________________________________________   I have reviewed the triage vital signs and the nursing notes.   HISTORY  Chief Complaint Altered Mental Status   History limited by and level 5 caveat due to altered mental status   HPI Paul Bryan is a 85 y.o. male who presents to the emergency department today because of concern for altered mental status.  Apparently the patient was at dinner when staff noticed he became more confused.  He also noticed he was having a more difficult time walking.  EMS report that the patient normally ambulates on his own.  State he is normally conversational and alert and oriented.  Patient himself cannot give any significant history and is unaware of why he was transported to the hospital.  He does deny any pain.  Records reviewed. Per medical record review patient has a history of dementia  Past Medical History:  Diagnosis Date  . Hypertension   . Restless leg syndrome     Patient Active Problem List   Diagnosis Date Noted  . GIB (gastrointestinal bleeding) 10/09/2017    Past Surgical History:  Procedure Laterality Date  . ABLATION    . APPENDECTOMY    . COLONOSCOPY N/A 10/10/2017   Procedure: COLONOSCOPY;  Surgeon: Jonathon Bellows, MD;  Location: [facility] ENDOSCOPY;  Service: Gastroenterology;  Laterality: N/A;  . ESOPHAGOGASTRODUODENOSCOPY N/A 10/10/2017   Procedure: ESOPHAGOGASTRODUODENOSCOPY (EGD);  Surgeon: Jonathon Bellows, MD;  Location[facility] ENDOSCOPY;  Service: Gastroenterology;  Laterality: N/A;  . FISSURECTOMY      Prior to Admission medications   Medication Sig Start Date End Date Taking? Authorizing Provider  furosemide (LASIX) 20 MG tablet Take 20 mg by mouth daily. 07/19/17   [provider]  galantamine (RAZADYNE) 8 MG tablet Take 8 mg by mouth daily.  09/08/17   [provider]  KLOR-CON M10 10 MEQ tablet Take 10 mEq by mouth 2 (two) times daily.   09/08/17   [provider]  pantoprazole (PROTONIX) 40 MG tablet Take 40 mg by mouth daily. 08/25/17   [provider]  simvastatin (ZOCOR) 20 MG tablet Take 20 mg by mouth at bedtime.  09/14/17   [provider]  tamsulosin (FLOMAX) 0.4 MG CAPS capsule Take 0.4 mg by mouth daily. 09/12/17   [provider]    Allergies Patient has no known allergies.  No family history on file.  Social History Social History   Tobacco Use  . Smoking status: Former Research scientist (life sciences)  . Smokeless tobacco: Never Used  Substance Use Topics  . Alcohol use: Not on file  . Drug use: Not on file    Review of Systems Unable to obtain reliable review of systems secondary to altered mental status  ____________________________________________   PHYSICAL EXAM:  VITAL SIGNS: ED Triage Vitals  Enc Vitals Group     BP 02/04/19 2048 (!) 175/88     Pulse Rate 02/04/19 2048 61     Resp 02/04/19 2048 17     Temp 02/04/19 2049 97.8 F (36.6 C)     Temp src --      SpO2 02/04/19 2048 97 %   Constitutional: Awake and alert.  Not oriented. Eyes: Conjunctivae are normal.  ENT      Head: Normocephalic and atraumatic.      Nose: No congestion/rhinnorhea.      Mouth/Throat: Mucous membranes are moist.      Neck: No stridor. Hematological/Lymphatic/Immunilogical: No cervical lymphadenopathy. Cardiovascular: Normal rate,  regular rhythm.  No murmurs, rubs, or gallops.  Respiratory: Normal respiratory effort without tachypnea nor retractions. Breath sounds are clear and equal bilaterally. No wheezes/rales/rhonchi. Gastrointestinal: Soft and non tender. No rebound. No guarding.  Genitourinary: Deferred Musculoskeletal: Normal range of motion in all extremities. No lower extremity edema. Neurologic: Awake and alert.  Not oriented.  Able to follow commands.  Moving all extremities.  Somewhat garbled speech. Skin:  Skin is warm, dry and intact. No rash  noted.  ____________________________________________    LABS (pertinent positives/negatives)  Trop hs 8 Lactic 1.8 CBC wbc 7.8, hgb 14.3, plt 307 BMP na 137, k 3.7, glu 104, cr 1.81  ____________________________________________   EKG  I, Phineas Semen, attending physician, personally viewed and interpreted this EKG  EKG Time: 2051 Rate: 70 Rhythm: sinus rhythm Axis: normal Intervals: qtc 437 QRS: narrow ST changes: no st elevation Impression: abnormal ekg   ____________________________________________    RADIOLOGY  CT head No acute abnormality  ____________________________________________   PROCEDURES  Procedures  ____________________________________________   INITIAL IMPRESSION / ASSESSMENT AND PLAN / ED COURSE  Pertinent labs & imaging results that were available during my care of the patient were reviewed by me and considered in my medical decision making (see chart for details).   Patient presented to the emergency department tonight from living facility because of concerns for altered mental status.  Work-up would be broad including infection, electrolyte abnormality, dehydration, intracranial process.  Head CT without any concerning acute findings.  Blood work without any concerning electrolyte abnormalities.  Patient will have a urine checked   ____________________________________________   FINAL CLINICAL IMPRESSION(S) / ED DIAGNOSES  Altered mental status  Note: This dictation was prepared with Dragon dictation. Any transcriptional errors that result from this process are unintentional     Phineas Semen, MD 02/05/19 1726    Phineas Semen, MD 02/05/19 1726

## 2019-02-04 NOTE — ED Triage Notes (Signed)
Per EMS, the patient has been more confused than normal since supper time today. Patient's speech is difficult to understand.

## 2019-02-05 ENCOUNTER — Emergency Department: Payer: Medicare Other

## 2019-02-05 DIAGNOSIS — Z7982 Long term (current) use of aspirin: Secondary | ICD-10-CM | POA: Diagnosis not present

## 2019-02-05 DIAGNOSIS — J189 Pneumonia, unspecified organism: Secondary | ICD-10-CM | POA: Diagnosis present

## 2019-02-05 DIAGNOSIS — E785 Hyperlipidemia, unspecified: Secondary | ICD-10-CM | POA: Diagnosis present

## 2019-02-05 DIAGNOSIS — A419 Sepsis, unspecified organism: Secondary | ICD-10-CM | POA: Diagnosis present

## 2019-02-05 DIAGNOSIS — Z87891 Personal history of nicotine dependence: Secondary | ICD-10-CM | POA: Diagnosis not present

## 2019-02-05 DIAGNOSIS — R4182 Altered mental status, unspecified: Secondary | ICD-10-CM | POA: Diagnosis present

## 2019-02-05 DIAGNOSIS — Z79891 Long term (current) use of opiate analgesic: Secondary | ICD-10-CM | POA: Diagnosis not present

## 2019-02-05 DIAGNOSIS — F0281 Dementia in other diseases classified elsewhere with behavioral disturbance: Secondary | ICD-10-CM | POA: Diagnosis present

## 2019-02-05 DIAGNOSIS — K219 Gastro-esophageal reflux disease without esophagitis: Secondary | ICD-10-CM | POA: Diagnosis present

## 2019-02-05 DIAGNOSIS — I129 Hypertensive chronic kidney disease with stage 1 through stage 4 chronic kidney disease, or unspecified chronic kidney disease: Secondary | ICD-10-CM | POA: Diagnosis present

## 2019-02-05 DIAGNOSIS — Z79899 Other long term (current) drug therapy: Secondary | ICD-10-CM | POA: Diagnosis not present

## 2019-02-05 DIAGNOSIS — G301 Alzheimer's disease with late onset: Secondary | ICD-10-CM | POA: Diagnosis present

## 2019-02-05 DIAGNOSIS — Z23 Encounter for immunization: Secondary | ICD-10-CM | POA: Diagnosis present

## 2019-02-05 DIAGNOSIS — N183 Chronic kidney disease, stage 3 unspecified: Secondary | ICD-10-CM | POA: Diagnosis present

## 2019-02-05 DIAGNOSIS — G9341 Metabolic encephalopathy: Secondary | ICD-10-CM | POA: Diagnosis present

## 2019-02-05 DIAGNOSIS — N179 Acute kidney failure, unspecified: Secondary | ICD-10-CM | POA: Diagnosis present

## 2019-02-05 DIAGNOSIS — Z20828 Contact with and (suspected) exposure to other viral communicable diseases: Secondary | ICD-10-CM | POA: Diagnosis present

## 2019-02-05 DIAGNOSIS — N400 Enlarged prostate without lower urinary tract symptoms: Secondary | ICD-10-CM | POA: Diagnosis present

## 2019-02-05 DIAGNOSIS — G2581 Restless legs syndrome: Secondary | ICD-10-CM | POA: Diagnosis present

## 2019-02-05 LAB — LACTIC ACID, PLASMA: Lactic Acid, Venous: 2.8 mmol/L (ref 0.5–1.9)

## 2019-02-05 LAB — CREATININE, SERUM
Creatinine, Ser: 1.65 mg/dL — ABNORMAL HIGH (ref 0.61–1.24)
GFR calc Af Amer: 43 mL/min — ABNORMAL LOW (ref >60)
GFR calc non Af Amer: 37 mL/min — ABNORMAL LOW (ref >60)

## 2019-02-05 LAB — SARS CORONAVIRUS 2 BY RT PCR (HOSPITAL ORDER, PERFORMED IN ~~LOC~~ HOSPITAL LAB): SARS Coronavirus 2: NEGATIVE

## 2019-02-05 LAB — TROPONIN I (HIGH SENSITIVITY): Troponin I (High Sensitivity): 25 ng/L — ABNORMAL HIGH (ref <18)

## 2019-02-05 MED ORDER — LACTATED RINGERS IV BOLUS
1000.0000 mL | Freq: Once | INTRAVENOUS | Status: AC
  Administered 2019-02-05: 1000 mL via INTRAVENOUS

## 2019-02-05 MED ORDER — VITAMIN D 25 MCG (1000 UNIT) PO TABS
5000.0000 [IU] | ORAL_TABLET | Freq: Every day | ORAL | Status: DC
  Administered 2019-02-06 – 2019-02-08 (×3): 5000 [IU] via ORAL
  Filled 2019-02-05 (×3): qty 5

## 2019-02-05 MED ORDER — TRAZODONE HCL 50 MG PO TABS
50.0000 mg | ORAL_TABLET | Freq: Every day | ORAL | Status: DC
  Administered 2019-02-05 – 2019-02-07 (×3): 50 mg via ORAL
  Filled 2019-02-05 (×3): qty 1

## 2019-02-05 MED ORDER — ASPIRIN EC 81 MG PO TBEC
81.0000 mg | DELAYED_RELEASE_TABLET | Freq: Every day | ORAL | Status: DC
  Administered 2019-02-05: 81 mg via ORAL
  Filled 2019-02-05 (×2): qty 1

## 2019-02-05 MED ORDER — SENNA 8.6 MG PO TABS
1.0000 | ORAL_TABLET | Freq: Every day | ORAL | Status: DC
  Administered 2019-02-05 – 2019-02-08 (×4): 8.6 mg via ORAL
  Filled 2019-02-05 (×4): qty 1

## 2019-02-05 MED ORDER — LABETALOL HCL 5 MG/ML IV SOLN
INTRAVENOUS | Status: AC
  Administered 2019-02-05: 10 mg via INTRAVENOUS
  Filled 2019-02-05: qty 4

## 2019-02-05 MED ORDER — LABETALOL HCL 5 MG/ML IV SOLN
10.0000 mg | INTRAVENOUS | Status: DC | PRN
  Administered 2019-02-05 – 2019-02-06 (×3): 10 mg via INTRAVENOUS
  Filled 2019-02-05 (×2): qty 4

## 2019-02-05 MED ORDER — MELATONIN 5 MG PO TABS
5.0000 mg | ORAL_TABLET | Freq: Every day | ORAL | Status: DC
  Administered 2019-02-05 – 2019-02-07 (×3): 5 mg via ORAL
  Filled 2019-02-05 (×4): qty 1

## 2019-02-05 MED ORDER — TAMSULOSIN HCL 0.4 MG PO CAPS
0.4000 mg | ORAL_CAPSULE | Freq: Every day | ORAL | Status: DC
  Administered 2019-02-05 – 2019-02-08 (×4): 0.4 mg via ORAL
  Filled 2019-02-05 (×4): qty 1

## 2019-02-05 MED ORDER — SIMVASTATIN 20 MG PO TABS
20.0000 mg | ORAL_TABLET | Freq: Every day | ORAL | Status: DC
  Administered 2019-02-05 – 2019-02-07 (×3): 20 mg via ORAL
  Filled 2019-02-05 (×3): qty 1

## 2019-02-05 MED ORDER — FAMOTIDINE 20 MG PO TABS
10.0000 mg | ORAL_TABLET | Freq: Every day | ORAL | Status: DC
  Administered 2019-02-05 – 2019-02-06 (×2): 10 mg via ORAL
  Filled 2019-02-05 (×3): qty 1

## 2019-02-05 MED ORDER — SODIUM CHLORIDE 0.9 % IV SOLN
1.0000 g | Freq: Once | INTRAVENOUS | Status: AC
  Administered 2019-02-05: 1 g via INTRAVENOUS
  Filled 2019-02-05: qty 10

## 2019-02-05 MED ORDER — POTASSIUM CHLORIDE CRYS ER 20 MEQ PO TBCR: 10.0000 meq | EXTENDED_RELEASE_TABLET | Freq: Two times a day (BID) | ORAL | Status: DC

## 2019-02-05 MED ORDER — VITAMIN D (ERGOCALCIFEROL) 1.25 MG (50000 UNIT) PO CAPS: 5000.0000 [IU] | ORAL_CAPSULE | Freq: Every day | ORAL | Status: DC

## 2019-02-05 MED ORDER — PANTOPRAZOLE SODIUM 40 MG PO TBEC
40.0000 mg | DELAYED_RELEASE_TABLET | Freq: Every day | ORAL | Status: DC
  Administered 2019-02-05: 11:00:00 40 mg via ORAL
  Filled 2019-02-05 (×2): qty 1

## 2019-02-05 MED ORDER — ACETAMINOPHEN 500 MG PO TABS
500.0000 mg | ORAL_TABLET | Freq: Three times a day (TID) | ORAL | Status: DC
  Administered 2019-02-05 – 2019-02-08 (×10): 500 mg via ORAL
  Filled 2019-02-05 (×12): qty 1

## 2019-02-05 MED ORDER — HEPARIN SODIUM (PORCINE) 5000 UNIT/ML IJ SOLN
5000.0000 [IU] | Freq: Three times a day (TID) | INTRAMUSCULAR | Status: DC
  Administered 2019-02-05 – 2019-02-08 (×11): 5000 [IU] via SUBCUTANEOUS
  Filled 2019-02-05 (×11): qty 1

## 2019-02-05 MED ORDER — SODIUM CHLORIDE 0.9 % IV SOLN
500.0000 mg | Freq: Once | INTRAVENOUS | Status: AC
  Administered 2019-02-05: 03:00:00 500 mg via INTRAVENOUS
  Filled 2019-02-05: qty 500

## 2019-02-05 MED ORDER — TRAMADOL HCL 50 MG PO TABS: 50.0000 mg | ORAL_TABLET | Freq: Two times a day (BID) | ORAL | Status: DC | PRN

## 2019-02-05 MED ORDER — SODIUM CHLORIDE 0.9 % IV SOLN
2.0000 g | INTRAVENOUS | Status: DC
  Administered 2019-02-06 – 2019-02-08 (×3): 2 g via INTRAVENOUS
  Filled 2019-02-05: qty 20
  Filled 2019-02-05: qty 2
  Filled 2019-02-05: qty 20
  Filled 2019-02-05: qty 2

## 2019-02-05 MED ORDER — SODIUM CHLORIDE 0.9 % IV SOLN
500.0000 mg | INTRAVENOUS | Status: DC
  Administered 2019-02-06 – 2019-02-07 (×2): 500 mg via INTRAVENOUS
  Filled 2019-02-05 (×2): qty 500

## 2019-02-05 MED ORDER — SODIUM CHLORIDE 0.9 % IV SOLN
INTRAVENOUS | Status: DC
  Administered 2019-02-05 (×2): via INTRAVENOUS

## 2019-02-05 NOTE — ED Notes (Signed)
ED TO INPATIENT HANDOFF REPORT  ED Nurse Name and Phone #: Quillian Quince [place] Name/Age/Gender Paul Bryan 85 y.o. male Room/Bed: ED19A/ED19A  Code Status   Code Status: Full Code  Home/SNF/Other Skilled nursing facility Patient oriented to: self Is this baseline? No   Triage Complete: Triage complete  Chief Complaint alt mental status  Triage Note Per EMS, the patient has been more confused than normal since supper time today. Patient's speech is difficult to understand.   Allergies No Known Allergies  Level of Care/Admitting Diagnosis ED Disposition    ED Disposition Condition [facility] Area: [place] [[id number]]  Level of Care: Med-Surg [16]  Covid Evaluation: Confirmed COVID Negative  Diagnosis: Altered mental status [780.97.ICD-9-CM]  Admitting Physician: Rufina Falco [place]  Attending Physician: Rufina Falco ACHIENG [[id number]]  Estimated length of stay: past midnight tomorrow  Certification:: I certify this patient will need inpatient services for at least 2 midnights  PT Class (Do Not Modify): Inpatient [101]  PT Acc Code (Do Not Modify): Private [1]       B Medical/Surgery History Past Medical History:  Diagnosis Date  . Hypertension   . Restless leg syndrome    Past Surgical History:  Procedure Laterality Date  . ABLATION    . APPENDECTOMY    . COLONOSCOPY N/A 10/10/2017   Procedure: COLONOSCOPY;  Surgeon: Jonathon Bellows, MD;  Location: [facility] ENDOSCOPY;  Service: Gastroenterology;  Laterality: N/A;  . ESOPHAGOGASTRODUODENOSCOPY N/A 10/10/2017   Procedure: ESOPHAGOGASTRODUODENOSCOPY (EGD);  Surgeon: Jonathon Bellows, MD;  Location: [facility] ENDOSCOPY;  Service: Gastroenterology;  Laterality: N/A;  . FISSURECTOMY       A IV Location/Drains/Wounds Patient Lines/Drains/Airways Status   Active Line/Drains/Airways    Name:   Placement date:   Placement time:   Site:   Days:   Peripheral IV 02/04/19 Left  Antecubital   02/04/19    2052    Antecubital   1   Peripheral IV 02/05/19 Left Forearm   02/05/19    0044    Forearm   less than 1          Intake/Output Last 24 hours  Intake/Output Summary (Last 24 hours) at 02/05/2019 0235 Last data filed at 02/05/2019 0200 Gross per 24 hour  Intake 1000 ml  Output -  Net 1000 ml    Labs/Imaging Results for orders placed or performed during the hospital encounter of 02/04/19 (from the past 48 hour(s))  CBC with Differential     Status: Abnormal   Collection Time: 02/04/19  8:46 PM  Result Value Ref Range   WBC 7.8 4.0 - 10.5 K/uL   RBC 5.05 4.22 - 5.81 MIL/uL   Hemoglobin 14.3 13.0 - 17.0 g/dL   HCT 43.2 39.0 - 52.0 %   MCV 85.5 80.0 - 100.0 fL   MCH 28.3 26.0 - 34.0 pg   MCHC 33.1 30.0 - 36.0 g/dL   RDW 14.6 11.5 - 15.5 %   Platelets 307 150 - 400 K/uL   nRBC 0.0 0.0 - 0.2 %   Neutrophils Relative % 58 %   Neutro Abs 4.6 1.7 - 7.7 K/uL   Lymphocytes Relative 23 %   Lymphs Abs 1.8 0.7 - 4.0 K/uL   Monocytes Relative 14 %   Monocytes Absolute 1.1 (H) 0.1 - 1.0 K/uL   Eosinophils Relative 3 %   Eosinophils Absolute 0.2 0.0 - 0.5 K/uL   Basophils Relative 1 %   Basophils  Absolute 0.1 0.0 - 0.1 K/uL   Immature Granulocytes 1 %   Abs Immature Granulocytes 0.04 0.00 - 0.07 K/uL    Comment: Performed at [facility], [address] [place], [place] [address]  Basic metabolic panel     Status: Abnormal   Collection Time: 02/04/19  8:46 PM  Result Value Ref Range   Sodium 137 135 - 145 mmol/L   Potassium 3.7 3.5 - 5.1 mmol/L   Chloride 102 98 - 111 mmol/L   CO2 23 22 - 32 mmol/L   Glucose, Bld 104 (H) 70 - 99 mg/dL   BUN 23 8 - 23 mg/dL   Creatinine, Ser 6.60 (H) 0.61 - 1.24 mg/dL   Calcium 9.1 8.9 - 63.0 mg/dL   GFR calc non Af Amer 33 (L) >60 mL/min   GFR calc Af Amer 38 (L) >60 mL/min   Anion gap 12 5 - 15    Comment: Performed at [facility], [address], [place], [place] [address]  Troponin I (High  Sensitivity)     Status: None   Collection Time: 02/04/19  8:46 PM  Result Value Ref Range   Troponin I (High Sensitivity) 8 <18 ng/L    Comment: (NOTE) Elevated high sensitivity troponin I (hsTnI) values and significant  changes across serial measurements may suggest ACS but many other  chronic and acute conditions are known to elevate hsTnI results.  Refer to the "Links" section for chest pain algorithms and additional  guidance. Performed at [facility], [address] [place], [place] [address]   Lactic acid, plasma     Status: None   Collection Time: 02/04/19  8:46 PM  Result Value Ref Range   Lactic Acid, Venous 1.8 0.5 - 1.9 mmol/L    Comment: Performed at [facility], [address] [place], [place] [address]  Urinalysis, Complete w Microscopic     Status: Abnormal   Collection Time: 02/04/19 11:40 PM  Result Value Ref Range   Color, Urine YELLOW (A) YELLOW   APPearance CLEAR (A) CLEAR   Specific Gravity, Urine 1.012 1.005 - 1.030   pH 5.0 5.0 - 8.0   Glucose, UA NEGATIVE NEGATIVE mg/dL   Hgb urine dipstick NEGATIVE NEGATIVE   Bilirubin Urine NEGATIVE NEGATIVE   Ketones, ur NEGATIVE NEGATIVE mg/dL   Protein, ur 025 (A) NEGATIVE mg/dL   Nitrite NEGATIVE NEGATIVE   Leukocytes,Ua NEGATIVE NEGATIVE   RBC / HPF 0-5 0 - 5 RBC/hpf   WBC, UA 0-5 0 - 5 WBC/hpf   Bacteria, UA NONE SEEN NONE SEEN   Squamous Epithelial / LPF NONE SEEN 0 - 5    Comment: Performed at [facility], [address] [place], [place] [address]  Lactic acid, plasma     Status: Abnormal   Collection Time: 02/04/19 11:40 PM  Result Value Ref Range   Lactic Acid, Venous 2.8 (HH) 0.5 - 1.9 mmol/L    Comment: CRITICAL RESULT CALLED TO, READ BACK BY AND VERIFIED WITH Novak Stgermaine ON 02/05/19 AT 0013 [facility] Performed at [facility], [address] [place], [place] [address]   Troponin I (High Sensitivity)     Status: Abnormal   Collection Time: 02/04/19 11:40 PM   Result Value Ref Range   Troponin I (High Sensitivity) 25 (H) <18 ng/L    Comment: (NOTE) Elevated high sensitivity troponin I (hsTnI) values and significant  changes across serial measurements may suggest ACS but many other  chronic and  acute conditions are known to elevate hsTnI results.  Refer to the "Links" section for chest pain algorithms and additional  guidance. Performed at [facility], [address] [place], [place] [address]   SARS Coronavirus 2 [facility] order, Performed in [facility] hospital lab) Nasopharyngeal     Status: None   Collection Time: 02/04/19 11:40 PM   Specimen: Nasopharyngeal  Result Value Ref Range   SARS Coronavirus 2 NEGATIVE NEGATIVE    Comment: (NOTE) If result is NEGATIVE SARS-CoV-2 target nucleic acids are NOT DETECTED. The SARS-CoV-2 RNA is generally detectable in upper and lower  respiratory specimens during the acute phase of infection. The lowest  concentration of SARS-CoV-2 viral copies this assay can detect is 250  copies / mL. A negative result does not preclude SARS-CoV-2 infection  and should not be used as the sole basis for treatment or other  patient management decisions.  A negative result may occur with  improper specimen collection / handling, submission of specimen other  than nasopharyngeal swab, presence of viral mutation(s) within the  areas targeted by this assay, and inadequate number of viral copies  (<250 copies / mL). A negative result must be combined with clinical  observations, patient history, and epidemiological information. If result is POSITIVE SARS-CoV-2 target nucleic acids are DETECTED. The SARS-CoV-2 RNA is generally detectable in upper and lower  respiratory specimens dur ing the acute phase of infection.  Positive  results are indicative of active infection with SARS-CoV-2.  Clinical  correlation with patient history and other diagnostic information is  necessary to determine patient  infection status.  Positive results do  not rule out bacterial infection or co-infection with other viruses. If result is PRESUMPTIVE POSTIVE SARS-CoV-2 nucleic acids MAY BE PRESENT.   A presumptive positive result was obtained on the submitted specimen  and confirmed on repeat testing.  While 2019 novel coronavirus  (SARS-CoV-2) nucleic acids may be present in the submitted sample  additional confirmatory testing may be necessary for epidemiological  and / or clinical management purposes  to differentiate between  SARS-CoV-2 and other Sarbecovirus currently known to infect humans.  If clinically indicated additional testing with an alternate test  methodology [phone]) is advised. The SARS-CoV-2 RNA is generally  detectable in upper and lower respiratory sp ecimens during the acute  phase of infection. The expected result is Negative. Fact Sheet for Patients:  BoilerBrush.com.cy Fact Sheet for Healthcare Providers: https://pope.com/ This test is not yet approved or cleared by the [place] FDA and has been authorized for detection and/or diagnosis of SARS-CoV-2 by FDA under an Emergency Use Authorization (EUA).  This EUA will remain in effect (meaning this test can be used) for the duration of the COVID-19 declaration under Section 564(b)(1) of the Act, 21 U.S.C. section 360bbb-3(b)(1), unless the authorization is terminated or revoked sooner. Performed at [facility], [address] [place], [place] [address]    Ct Head Wo Contrast  Result Date: 02/04/2019 CLINICAL DATA:  85 year old male with increased confusion. EXAM: CT HEAD WITHOUT CONTRAST TECHNIQUE: Contiguous axial images were obtained from the base of the skull through the vertex without intravenous contrast. COMPARISON:  None. FINDINGS: Brain: Nonspecific lateral and 3rd ventricular enlargement, but probably ex vacuo related. The cerebral aqueduct and 4th  ventricle appear normal. No transependymal edema suspected. There is disproportionate atrophy of the anterior and mesial temporal lobes. Patchy bilateral cerebral white matter hypodensity, most pronounced in the corona radiata. Some associated heterogeneity of the left  thalamus. No acute intracranial hemorrhage identified. No cortically based acute infarct identified. No midline shift, mass effect, or evidence of intracranial mass lesion. Vascular: Calcified atherosclerosis at the skull base. No suspicious intracranial vascular hyperdensity. Dominant appearing right vertebral artery. Skull: No acute osseous abnormality identified. Sinuses/Orbits: Mild fluid and mucosal thickening in the left maxillary sinus. Other visualized paranasal sinuses and mastoids are well pneumatized. Other: Postoperative changes to the globes. No acute orbit or scalp soft tissue finding. IMPRESSION: 1. No acute intracranial abnormality identified. 2. Evidence of chronic small vessel disease with superimposed disproportionate atrophy of the temporal lobes, and ventricular enlargement which is probably ex vacuo. 3. Mild inflammatory changes in the left maxillary sinus. Electronically Signed   By: Odessa Fleming M.D.   On: 02/04/2019 22:44   Dg Chest Portable 1 View  Result Date: 02/05/2019 CLINICAL DATA:  Altered mental status EXAM: PORTABLE CHEST 1 VIEW COMPARISON:  None. FINDINGS: Patchy multifocal areas of interstitial and airspace opacity throughout both lungs predominantly in the right perihilar region and left periphery. No pneumothorax or effusion. Aorta is calcified and slightly tortuous. Cardiomediastinal contours are otherwise unremarkable. Surgical clips are present in the right upper quadrant, likely prior cholecystectomy. Degenerative changes are present in the imaged spine and shoulders. No acute osseous or soft tissue abnormality is evident. IMPRESSION: Patchy multifocal areas of interstitial and airspace opacity throughout both  lungs, suspicious for multifocal pneumonia. Electronically Signed   By: Kreg Shropshire M.D.   On: 02/05/2019 00:40    Pending Labs Unresulted Labs (From admission, onward)    Start     Ordered   02/05/19 0220  Creatinine, serum  (heparin)  Once,   STAT    Comments: Baseline for heparin therapy IF NOT ALREADY DRAWN.    02/05/19 0222   02/05/19 0218  HIV antibody (Routine Screening)  Once,   STAT     02/05/19 0222   02/05/19 0048  Culture, blood (routine x 2)  BLOOD CULTURE X 2,   STAT     02/05/19 0047          Vitals/Pain Today's Vitals   02/04/19 2053 02/04/19 2230 02/05/19 0030 02/05/19 0130  BP:  (!) 193/116 (!) 185/105 (!) 167/94  Pulse:  (!) 116 (!) 129 (!) 112  Resp:  (!) Temp:      SpO2:  (!) 83% 91% 90%  Weight: 81.6 kg     Height:  (1.727 m)       Isolation Precautions No active isolations  Medications Medications  azithromycin (ZITHROMAX) 500 mg in sodium chloride 0.9 % 250 mL IVPB (has no administration in time range)  acetaminophen (TYLENOL) tablet 500 mg (has no administration in time range)  aspirin EC tablet 81 mg (has no administration in time range)  traMADol (ULTRAM) tablet 50 mg (has no administration in time range)  simvastatin (ZOCOR) tablet 20 mg (has no administration in time range)  traZODone (DESYREL) tablet 50 mg (has no administration in time range)  famotidine (PEPCID) tablet 40 mg (has no administration in time range)  pantoprazole (PROTONIX) EC tablet 40 mg (has no administration in time range)  senna (SENOKOT) tablet 8.6 mg (has no administration in time range)  tamsulosin (FLOMAX) capsule 0.4 mg (has no administration in time range)  Melatonin TABS 5 mg (has no administration in time range)  potassium chloride SA (K-DUR) CR tablet 10 mEq (has no administration in time range)  Vitamin D (Ergocalciferol) (DRISDOL) capsule 50,000 Units (has  no administration in time range)  heparin injection 5,000 Units (has no administration  in time range)  0.9 %  sodium chloride infusion (has no administration in time range)  cefTRIAXone (ROCEPHIN) 2 g in sodium chloride 0.9 % 100 mL IVPB (has no administration in time range)  azithromycin (ZITHROMAX) 500 mg in sodium chloride 0.9 % 250 mL IVPB (has no administration in time range)  LORazepam (ATIVAN) injection 1 mg (1 mg Intravenous Given 02/04/19 2140)  haloperidol lactate (HALDOL) injection 5 mg (5 mg Intravenous Given 02/04/19 2155)  LORazepam (ATIVAN) injection 2 mg (2 mg Intravenous Given 02/04/19 2210)  lactated ringers bolus 1,000 mL (0 mLs Intravenous Stopped 02/05/19 0200)  cefTRIAXone (ROCEPHIN) 1 g in sodium chloride 0.9 % 100 mL IVPB (1 g Intravenous New Bag/Given 02/05/19 0159)    Mobility walks with device High fall risk   Focused Assessments Neuro Assessment Handoff:  Swallow screen pass? N/A     Last date known well: 02/04/19   Neuro Assessment:   Neuro Checks:      Last Documented NIHSS Modified Score:   Has TPA been given? No If patient is a Neuro Trauma and patient is going to OR before floor call report to 4N Charge nurse: [phone] or [phone]     R Recommendations: See Admitting Provider Note  Report given to:   Additional Notes:  Patient was noted by his son and by SNF to speak word salad at supper yesterday. Speaking word salad is new for this patient.

## 2019-02-05 NOTE — Evaluation (Signed)
Physical Therapy Evaluation Patient Details Name: STEPFON Bryan MRN: [id number] DOB: 1929-11-26 Today's Date: 02/05/2019   History of Present Illness  Per MD: 85 y.o. male presented to the ED with altered mental status, restless so history is very limited. Per ED reports, patient presented from [facility] with confusion and speech difficulty. Per staff at [facility], patient has underlying Dementia and confused at baseline, he was noted to be more confused with word finding difficulty which was described as word salad staff and patient's son.  Given that this was not patient's baseline, he was transported to the ED for further evaluation and management. MD dx includes, sepsis with multifocal pneumonia, acute encephalopathy and acute kidney injury. PMH includes: Alzheimer's disease/underlying dementia, hypertension, OSA, hyperlipidemia, BPH, GERD, GI bleed  Clinical Impression  Pt presented with deficits in strength, transfers, mobility, gait, and balance. At beginning of session, pt was in bed with a sitter in room and safety mittens donned. Pt completed ~50% of bed exercises Ind and was AAROM for the rest due to lack of attention to the task. Pt required mod cuing to safely come to EOB, and completed sit<>stand with min cuing and CGA. Pt was eager to walk, but took very small steps and upon turning almost had a LOB which he corrected with close CGA provided for safety. Pt still somewhat restless at the end of session, trying to remove his mittens, but allowed PT to take off mittens for vitals and SpO2 without issue. Pt will benefit from HHPT services upon discharge to safely address above deficits for decreased caregiver assistance and eventual return to PLOF.     Follow Up Recommendations Home health PT    Equipment Recommendations  Other (comment)(Pt was a poor historian, unable to determine ADs avail at [facility])    Recommendations for Other Services       Precautions / Restrictions  Precautions Precautions: Fall Restrictions Weight Bearing Restrictions: No      Mobility  Bed Mobility Overal bed mobility: Needs Assistance Bed Mobility: Supine to Sit     Supine to sit: Supervision     General bed mobility comments: Cuing and safety awareness reminders  Transfers Overall transfer level: Needs assistance Equipment used: Rolling walker (2 wheeled) Transfers: Sit to/from Stand Sit to Stand: Min guard         General transfer comment: Min instability upon standing, but able to self-correct  Ambulation/Gait Ambulation/Gait assistance: Min guard Gait Distance (Feet): 50 Feet Assistive device: Rolling walker (2 wheeled) Gait Pattern/deviations: Step-through pattern;Decreased step length - right;Decreased step length - left;Decreased stride length Gait velocity: Decreased   General Gait Details: Pt used very small, slow steps on straightaways, but at turns would move the walker far ahead of his BOS and had x1 LOB which was self-corrected, close CGA for safety.  Stairs            Wheelchair Mobility    Modified Rankin (Stroke Patients Only)       Balance Overall balance assessment: Needs assistance Sitting-balance support: Single extremity supported;Feet unsupported Sitting balance-Leahy Scale: Fair Sitting balance - Comments: Left lateral lean toward HOB Postural control: Left lateral lean Standing balance support: Bilateral upper extremity supported;During functional activity Standing balance-Leahy Scale: Fair Standing balance comment: Heavy lean into RW                             Pertinent Vitals/Pain Pain Assessment: No/denies pain  Home Living Family/patient expects to be discharged to:: Assisted living               Home Equipment: Other (comment)(Pt was a poor historian, was not able to determine for sure)      Prior Function Level of Independence: Needs assistance         Comments: Pt was a poor  historian, was not able to determine for sure     Hand Dominance        Extremity/Trunk Assessment   Upper Extremity Assessment Upper Extremity Assessment: Overall WFL for tasks assessed    Lower Extremity Assessment Lower Extremity Assessment: Generalized weakness    Cervical / Trunk Assessment Cervical / Trunk Assessment: Kyphotic  Communication   Communication: Other (comment)(Dementia-related)  Cognition Arousal/Alertness: Awake/alert Behavior During Therapy: Restless Overall Cognitive Status: No family/caregiver present to determine baseline cognitive functioning                                 General Comments: Pt wearing safety mittens to prevent him from removing the IV      General Comments      Exercises Total Joint Exercises Ankle Circles/Pumps: AROM;Strengthening;Both;10 reps;15 reps Short Arc Quad: AROM;Strengthening;Both;10 reps Hip ABduction/ADduction: AAROM;Left;10 reps Long Arc Quad: AROM;Strengthening;Both;10 reps;15 reps Knee Flexion: AROM;Strengthening;Both;5 reps;10 reps   Assessment/Plan    PT Assessment Patient needs continued PT services  PT Problem List Decreased strength;Decreased balance;Decreased mobility;Decreased coordination;Decreased knowledge of use of DME;Decreased safety awareness       PT Treatment Interventions DME instruction;Gait training;Stair training;Functional mobility training;Therapeutic activities;Therapeutic exercise;Balance training;Patient/family education    PT Goals (Current goals can be found in the Care Plan section)  Acute Rehab PT Goals Patient Stated Goal: N/A PT Goal Formulation: Patient unable to participate in goal setting Time For Goal Achievement: 02/16/19 Potential to Achieve Goals: Fair    Frequency Min 2X/week   Barriers to discharge        Co-evaluation               AM-PAC PT "6 Clicks" Mobility  Outcome Measure Help needed turning from your back to your side while  in a flat bed without using bedrails?: A Little Help needed moving from lying on your back to sitting on the side of a flat bed without using bedrails?: A Little Help needed moving to and from a bed to a chair (including a wheelchair)?: A Little Help needed standing up from a chair using your arms (e.g., wheelchair or bedside chair)?: A Little Help needed to walk in hospital room?: A Little Help needed climbing 3-5 steps with a railing? : A Lot 6 Click Score: 17    End of Session Equipment Utilized During Treatment: Gait belt Activity Tolerance: Patient tolerated treatment well Patient left: in chair;with call bell/phone within reach;with chair alarm set;with nursing/sitter in room;with restraints reapplied(Safety mittens donned) Nurse Communication: Mobility status PT Visit Diagnosis: Unsteadiness on feet (R26.81);Difficulty in walking, not elsewhere classified (R26.2);Muscle weakness (generalized) (M62.81)    Time: 4097-3532 PT Time Calculation (min) (ACUTE ONLY): 28 min   Charges:             Leonette Most "Gus" Roni Bread, SPT  02/05/19, 5:08 PM

## 2019-02-05 NOTE — Plan of Care (Signed)

## 2019-02-05 NOTE — TOC Initial Note (Signed)
Transition of Care [facility]) - Initial/Assessment Note    Patient Details  Name: Paul Bryan MRN: [id number] Date of Birth: 1929-12-23  Transition of Care [facility]) CM/SW Contact:    Leshia Kope, Lenice Llamas Phone Number: [phone]  02/05/2019, 12:10 PM  Clinical Narrative: Patient is a resident at [facility] ALF memory care. Clinical Education officer, museum (CSW) attempted to meet with patient however he was not alert and oriented. A sitter was at bedside. CSW attempted to contact patient's son Janeice Robinson however he did not answer and a voicemail was left. CSW also called paitnet's wife Mardene Celeste and was unable to leave a voicemail. CSW left patient's other relative Cherri a Advertising account executive. Per Rudi Heap memory care director patient is in the memory care unit and walks without a walker at baseline. Per Aldona Bar patient occassionally will sit in a wheel chair due to his arthritis however most of the time he walks independently. Aldona Bar reported that patient has intermittent cofusion. Per Aldona Bar patient does not have to be sitter free for 24 hours prior to coming back to [facility] because he will be going back to a locked memory care unit. CSW will continue to follow and assist as needed.    Expected Discharge Plan: Memory Care Barriers to Discharge: Continued Medical Work up   Patient Goals and CMS Choice        Expected Discharge Plan and Services Expected Discharge Plan: Memory Care In-house Referral: Clinical Social Work     Living arrangements for the past 2 months: [place]                                      Prior Living Arrangements/Services Living arrangements for the past 2 months: [place] Lives with:: Facility Resident Patient language and need for interpreter reviewed:: No        Need for Family Participation in Patient Care: Yes (Comment) Care giver support system in place?: Yes (comment)   Criminal Activity/Legal Involvement  Pertinent to Current Situation/Hospitalization: No - Comment as needed  Activities of Daily Living Home Assistive Devices/Equipment: None ADL Screening (condition at time of admission) Patient's cognitive ability adequate to safely complete daily activities?: No Is the patient deaf or have difficulty hearing?: No Does the patient have difficulty seeing, even when wearing glasses/contacts?: No Does the patient have difficulty concentrating, remembering, or making decisions?: Yes Patient able to express need for assistance with ADLs?: No Does the patient have difficulty dressing or bathing?: Yes Independently performs ADLs?: Yes (appropriate for developmental age) Does the patient have difficulty walking or climbing stairs?: Yes Weakness of Legs: Both Weakness of Arms/Hands: Both  Permission Sought/Granted                  Emotional Assessment Appearance:: Appears stated age     Orientation: : Oriented to Self, Fluctuating Orientation (Suspected and/or reported Sundowners) Alcohol / Substance Use: Not Applicable Psych Involvement: No (comment)  Admission diagnosis:  Altered mental status, unspecified altered mental status type [R41.82] Community acquired pneumonia, unspecified laterality [J18.9] Patient Active Problem List   Diagnosis Date Noted  . Altered mental status 02/05/2019  . GIB (gastrointestinal bleeding) 10/09/2017   PCP:  Derinda Late, MD Pharmacy:   [id number] [address], [place] [address] Phone: [phone] Fax: [phone]     Social Determinants of Health ([place])  Interventions    Readmission Risk Interventions No flowsheet data found.

## 2019-02-05 NOTE — H&P (Addendum)
[facility] [place] NAME: Paul Bryan    MR#:  [id number]  DATE OF BIRTH:  1930/04/06  DATE OF ADMISSION:  02/04/2019  PRIMARY CARE PHYSICIAN: Derinda Late, MD   REQUESTING/REFERRING PHYSICIAN: Blake Divine, MD  CHIEF COMPLAINT:   Chief Complaint  Patient presents with   Altered Mental Status    HISTORY OF PRESENT ILLNESS:  85 y.o. male with pertinent past medical history of probable late onset Alzheimer's disease, hypertension, OSA, hyperlipidemia, BPH, GERD, GI bleed, and RLS presenting to the ED with altered mental status.  Patient is currently altered and restless so history is very limited. Per ED reports, patient presented from [facility] with confusion and speech difficulty. Per staff at [facility], patient has underlying Dementia and confused at baseline, he was noted to be more confused with word finding difficulty which was described as word salad staff and patient's son.  No reports of associated focal neurologic deficit, numbness or weakness, syncope, complaints of visual disturbances, dizziness, headache or other associated symptoms.  Given that this was not patient's baseline, he was transported to the ED for further evaluation and management.  On arrival to the ED, he was initially afebrile but repeat temp showed elevated temp of 100.1 with blood pressure 207/184 mm Hg and pulse rate 126 beats/min, RR 31. There were no focal neurological deficits; he was alert but disoriented and demonstrating agitation and restlessness.  CBC unremarkable, glucose 104, creatinine 1.81, initial troponin 8 repeat 25 initial lactic acid 1.8 repeat 2.8, COVID-19 negative.  Urinalysis did not show evidence of UTI.  X-ray showed patchy multifocal areas of interstitial and airspace opacity in both lungs concerning for multifocal pneumonia.  Contrast CT head negative for acute intracranial abnormality.  Hospitalist asked to admit for further  management.  PAST MEDICAL HISTORY:   Past Medical History:  Diagnosis Date   Hypertension    Restless leg syndrome     PAST SURGICAL HISTORY:   Past Surgical History:  Procedure Laterality Date   ABLATION     APPENDECTOMY     COLONOSCOPY N/A 10/10/2017   Procedure: COLONOSCOPY;  Surgeon: Jonathon Bellows, MD;  Location: [facility] ENDOSCOPY;  Service: Gastroenterology;  Laterality: N/A;   ESOPHAGOGASTRODUODENOSCOPY N/A 10/10/2017   Procedure: ESOPHAGOGASTRODUODENOSCOPY (EGD);  Surgeon: Jonathon Bellows, MD;  Location: [facility] ENDOSCOPY;  Service: Gastroenterology;  Laterality: N/A;   FISSURECTOMY      SOCIAL HISTORY:   Social History   Tobacco Use   Smoking status: Former Smoker   Smokeless tobacco: Never Used  Substance Use Topics   Alcohol use: Not on file    FAMILY HISTORY:  History reviewed. No pertinent family history.  DRUG ALLERGIES:  No Known Allergies  REVIEW OF SYSTEMS:   Review of Systems  Unable to perform ROS: Mental status change   MEDICATIONS AT HOME:   Prior to Admission medications   Medication Sig Start Date End Date Taking? Authorizing Provider  acetaminophen (TYLENOL) 500 MG tablet Take 500 mg by mouth 3 (three) times daily.   Yes [provider]  aspirin EC 81 MG tablet Take 81 mg by mouth daily.   Yes [provider]  famotidine (PEPCID) 40 MG tablet Take 40 mg by mouth daily.   Yes [provider]  galantamine (RAZADYNE) 8 MG tablet Take 8 mg by mouth daily.  09/08/17  Yes [provider]  LORazepam (ATIVAN) 0.5 MG tablet Take 0.5 mg by mouth 2 (two) times daily as  needed for anxiety.   Yes [provider]  Melatonin 5 MG TABS Take 1 tablet by mouth at bedtime.   Yes [provider]  pantoprazole (PROTONIX) 40 MG tablet Take 40 mg by mouth daily. 08/25/17  Yes [provider]  senna (SENOKOT) 8.6 MG TABS tablet Take 1 tablet by mouth daily.   Yes [provider]  simvastatin (ZOCOR)  20 MG tablet Take 20 mg by mouth at bedtime.  09/14/17  Yes [provider]  tamsulosin (FLOMAX) 0.4 MG CAPS capsule Take 0.4 mg by mouth daily. 09/12/17  Yes [provider]  traMADol (ULTRAM) 50 MG tablet Take by mouth every 6 (six) hours as needed.   Yes [provider]  traZODone (DESYREL) 50 MG tablet Take 50 mg by mouth at bedtime.   Yes [provider]  VITAMIN D, ERGOCALCIFEROL, PO Take 5,000 Units by mouth daily.   Yes [provider]  furosemide (LASIX) 20 MG tablet Take 20 mg by mouth daily. 07/19/17   [provider]  KLOR-CON M10 10 MEQ tablet Take 10 mEq by mouth 2 (two) times daily.  09/08/17   [provider]      VITAL SIGNS:  Blood pressure (!) 167/94, pulse (!) 112, temperature 97.8 F (36.6 C), resp. rate 18, height 5\' 8"  (1.727 m), weight 81.6 kg, SpO2 90 %.  PHYSICAL EXAMINATION:   Physical Exam  GENERAL:  85 y.o.-year-old patient lying in the bed with no acute distress.  EYES: Pupils equal, round, reactive to light and accommodation. No scleral icterus. Extraocular muscles intact.  HEENT: Head atraumatic, normocephalic. Oropharynx and nasopharynx clear.  NECK:  Supple, no jugular venous distention. No thyroid enlargement, no tenderness.  LUNGS: Normal breath sounds bilaterally, no wheezing, rales,rhonchi or crepitation. No use of accessory muscles of respiration.  CARDIOVASCULAR: S1, S2 normal. No murmurs, rubs, or gallops.  ABDOMEN: Soft, nontender, nondistended. Bowel sounds present. No organomegaly or mass.  EXTREMITIES: No pedal edema, cyanosis, or clubbing.  NEUROLOGIC: Cranial nerves II through XII are intact.  Moves all extremities against gravity. Sensation intact. Gait not checked.  PSYCHIATRIC: Unable to assess SKIN: No obvious rash, lesion, or ulcer.   DATA REVIEWED:  LABORATORY PANEL:   CBC Recent Labs  Lab 02/04/19 2046  WBC 7.8  HGB 14.3  HCT 43.2  PLT 307    ------------------------------------------------------------------------------------------------------------------  Chemistries  Recent Labs  Lab 02/04/19 2046  NA 137  K 3.7  CL 102  CO2 23  GLUCOSE 104*  BUN 23  CREATININE 1.81*  CALCIUM 9.1   ------------------------------------------------------------------------------------------------------------------  Cardiac Enzymes No results for input(s): TROPONINI in the last 168 hours. ------------------------------------------------------------------------------------------------------------------  RADIOLOGY:  Ct Head Wo Contrast  Result Date: 02/04/2019 CLINICAL DATA:  85 year old male with increased confusion. EXAM: CT HEAD WITHOUT CONTRAST TECHNIQUE: Contiguous axial images were obtained from the base of the skull through the vertex without intravenous contrast. COMPARISON:  None. FINDINGS: Brain: Nonspecific lateral and 3rd ventricular enlargement, but probably ex vacuo related. The cerebral aqueduct and 4th ventricle appear normal. No transependymal edema suspected. There is disproportionate atrophy of the anterior and mesial temporal lobes. Patchy bilateral cerebral white matter hypodensity, most pronounced in the corona radiata. Some associated heterogeneity of the left thalamus. No acute intracranial hemorrhage identified. No cortically based acute infarct identified. No midline shift, mass effect, or evidence of intracranial mass lesion. Vascular: Calcified atherosclerosis at the skull base. No suspicious intracranial vascular hyperdensity. Dominant appearing right vertebral artery. Skull: No acute osseous abnormality  identified. Sinuses/Orbits: Mild fluid and mucosal thickening in the left maxillary sinus. Other visualized paranasal sinuses and mastoids are well pneumatized. Other: Postoperative changes to the globes. No acute orbit or scalp soft tissue finding. IMPRESSION: 1. No acute intracranial abnormality identified. 2.  Evidence of chronic small vessel disease with superimposed disproportionate atrophy of the temporal lobes, and ventricular enlargement which is probably ex vacuo. 3. Mild inflammatory changes in the left maxillary sinus. Electronically Signed   By: Odessa FlemingH  Hall M.D.   On: 02/04/2019 22:44   Dg Chest Portable 1 View  Result Date: 02/05/2019 CLINICAL DATA:  Altered mental status EXAM: PORTABLE CHEST 1 VIEW COMPARISON:  None. FINDINGS: Patchy multifocal areas of interstitial and airspace opacity throughout both lungs predominantly in the right perihilar region and left periphery. No pneumothorax or effusion. Aorta is calcified and slightly tortuous. Cardiomediastinal contours are otherwise unremarkable. Surgical clips are present in the right upper quadrant, likely prior cholecystectomy. Degenerative changes are present in the imaged spine and shoulders. No acute osseous or soft tissue abnormality is evident. IMPRESSION: Patchy multifocal areas of interstitial and airspace opacity throughout both lungs, suspicious for multifocal pneumonia. Electronically Signed   By: Kreg ShropshirePrice  DeHay M.D.   On: 02/05/2019 00:40    EKG:  EKG: unchanged from previous tracings, normal sinus rhythm. Vent. rate 70 BPM PR interval * ms QRS duration 103 ms QT/QTc 405/437 ms P-R-T axes 10 51 47 IMPRESSION AND PLAN:   85 y.o. male with pertinent past medical history of probable late onset Alzheimer's disease, hypertension, OSA, hyperlipidemia, BPH, GERD, GI bleed, and RLS presenting to the ED with altered mental status.  1.Sepsis - Patient meets SIRS criteria,Heart Rate 125 beats/minute, Respiratory Rate 31 breaths/minute,Temperature 100.1 with elevated lactic acid of 2.8. Etiology likely due to multifocal pneumonia - Admit to MedSurg unit - Covid negative - Check procalcitonin - UA shows no evidence of UTI - Urine cultures pending - Blood cultures pending - Start Empiric abx with azithromycin and ceftriaxone - IVFs and PRN  bolus to keep MAP<1565mmHg or SBP <4390mmHg  2. Altered mental status -likely due to pneumonia in a patient with underlying history of dementia - Treat pneumonia as above - CT head negative for acute intracranial abnormality - Avoid benzo due to paradoxical worsening of agitation and restlessness in a patient with dementia  3.  AKI on CKD -creatinine slightly elevated above baseline likely prerenal - Hold nephrotoxins - For fluids hydration - Continue to monitor renal functions  4. Hyperlipidemia - Simvastatin 20mg  PO qhs  5. Hypertension + Goal BP <130/80 - PRN labetalol  6. Late onset Alzheimer's disease now with behavioral disturbances - Hold benzos due to paradoxical worsening of agitation - Hold galantamine could worsen confusion  7. GERD - Protonix  8. DVT prophylaxis - Enoxaparin SubQ    All the records are reviewed and case discussed with ED provider. Management plans discussed with the patient, family and they are in agreement.  CODE STATUS: FULL  TOTAL TIME TAKING CARE OF THIS PATIENT: 50 minutes.    on 02/05/2019 at 2:22 AM  Webb SilversmithElizabeth Arber Wiemers, DNP, FNP-BC [facility] Nurse Practitioner Between 7am to 6pm - Pager [phone]- [phone]  After 6pm go to [email or web address] - Social research officer, governmentpassword EPAS [facility]  [facility] [place] [facility]  Office  [phone]  CC: Primary care physician; Kandyce RudBabaoff, Marcus, MD

## 2019-02-05 NOTE — ED Notes (Signed)
Report given to Stephanie

## 2019-02-05 NOTE — Progress Notes (Signed)
Patient ID: KYL GIVLER, male   DOB: 08-16-1929, 85 y.o.   MRN: [id number]  [facility] PROGRESS NOTE  MAHAD NEWSTROM WUJ:[id number] DOB: [birth year] DOA: 02/04/2019 PCP: Kandyce Rud, MD  HPI/Subjective: Patient awakened from sleep.  Patient was admitted with sepsis and multifocal pneumonia.  Objective: Vitals:   02/05/19 0753 02/05/19 0938  BP: (!) 165/95 (!) 145/78  Pulse: 85 70  Resp:    Temp: 99.1 F (37.3 C)   SpO2: 96%     Intake/Output Summary (Last 24 hours) at 02/05/2019 1320 Last data filed at 02/05/2019 1233 Gross per 24 hour  Intake 1350 ml  Output 550 ml  Net 800 ml   Filed Weights   02/04/19 2053 02/05/19 0731  Weight: 81.6 kg 79.6 kg    ROS: Review of Systems  Unable to perform ROS: Dementia   Exam: Physical Exam  HENT:  Nose: No mucosal edema.  Mouth/Throat: No oropharyngeal exudate or posterior oropharyngeal edema.  Eyes: Pupils are equal, round, and reactive to light. Conjunctivae, EOM and lids are normal.  Neck: No JVD present. Carotid bruit is not present. No edema present. No thyroid mass and no thyromegaly present.  Cardiovascular: S1 normal and S2 normal. Exam reveals no gallop.  No murmur heard. Respiratory: No respiratory distress. He has decreased breath sounds in the right lower field and the left lower field. He has no wheezes. He has rhonchi in the right lower field and the left lower field. He has no rales.  GI: Soft. Bowel sounds are normal. There is no abdominal tenderness.  Musculoskeletal:     Right ankle: He exhibits no swelling.     Left ankle: He exhibits no swelling.  Lymphadenopathy:    He has no cervical adenopathy.  Neurological: He is alert.  Skin: Skin is warm. No rash noted. Nails show no clubbing.  Psychiatric: He has a normal mood and affect.      Data Reviewed: Basic Metabolic Panel: Recent Labs  Lab 02/04/19 2046 02/05/19 0530  NA 137  --   K 3.7  --   CL 102  --   CO2 23  --   GLUCOSE 104*  --    BUN 23  --   CREATININE 1.81* 1.65*  CALCIUM 9.1  --    CBC: Recent Labs  Lab 02/04/19 2046  WBC 7.8  NEUTROABS 4.6  HGB 14.3  HCT 43.2  MCV 85.5  PLT 307     Recent Results (from the past 240 hour(s))  SARS Coronavirus 2 [facility] order, Performed in [facility] hospital lab) Nasopharyngeal     Status: None   Collection Time: 02/04/19 11:40 PM   Specimen: Nasopharyngeal  Result Value Ref Range Status   SARS Coronavirus 2 NEGATIVE NEGATIVE Final    Comment: (NOTE) If result is NEGATIVE SARS-CoV-2 target nucleic acids are NOT DETECTED. The SARS-CoV-2 RNA is generally detectable in upper and lower  respiratory specimens during the acute phase of infection. The lowest  concentration of SARS-CoV-2 viral copies this assay can detect is 250  copies / mL. A negative result does not preclude SARS-CoV-2 infection  and should not be used as the sole basis for treatment or other  patient management decisions.  A negative result may occur with  improper specimen collection / handling, submission of specimen other  than nasopharyngeal swab, presence of viral mutation(s) within the  areas targeted by this assay, and inadequate number of viral copies  (<250 copies / mL). A negative result  must be combined with clinical  observations, patient history, and epidemiological information. If result is POSITIVE SARS-CoV-2 target nucleic acids are DETECTED. The SARS-CoV-2 RNA is generally detectable in upper and lower  respiratory specimens dur ing the acute phase of infection.  Positive  results are indicative of active infection with SARS-CoV-2.  Clinical  correlation with patient history and other diagnostic information is  necessary to determine patient infection status.  Positive results do  not rule out bacterial infection or co-infection with other viruses. If result is PRESUMPTIVE POSTIVE SARS-CoV-2 nucleic acids MAY BE PRESENT.   A presumptive positive result was obtained on the  submitted specimen  and confirmed on repeat testing.  While 2019 novel coronavirus  (SARS-CoV-2) nucleic acids may be present in the submitted sample  additional confirmatory testing may be necessary for epidemiological  and / or clinical management purposes  to differentiate between  SARS-CoV-2 and other Sarbecovirus currently known to infect humans.  If clinically indicated additional testing with an alternate test  methodology [phone] is advised. The SARS-CoV-2 RNA is generally  detectable in upper and lower respiratory sp ecimens during the acute  phase of infection. The expected result is Negative. Fact Sheet for Patients:  BoilerBrush.com.cyhttps://www.fda.gov/media/136312/download Fact Sheet for Healthcare Providers: https://pope.com/https://www.fda.gov/media/136313/download This test is not yet approved or cleared by the [place] FDA and has been authorized for detection and/or diagnosis of SARS-CoV-2 by FDA under an Emergency Use Authorization (EUA).  This EUA will remain in effect (meaning this test can be used) for the duration of the COVID-19 declaration under Section 564(b)(1) of the Act, 21 U.S.C. section 360bbb-3(b)(1), unless the authorization is terminated or revoked sooner. Performed at [facility], [address] [place], [place] [address]   Culture, blood (routine x 2)     Status: None (Preliminary result)   Collection Time: 02/05/19 12:48 AM   Specimen: Left Antecubital; Blood  Result Value Ref Range Status   Specimen Description LEFT ANTECUBITAL  Final   Special Requests   Final    BOTTLES DRAWN AEROBIC AND ANAEROBIC Blood Culture adequate volume   Culture   Final    NO GROWTH < 12 HOURS Performed at [facility], [address] [place], [place] [address]    Report Status PENDING  Incomplete  Culture, blood (routine x 2)     Status: None (Preliminary result)   Collection Time: 02/05/19  1:00 AM   Specimen: BLOOD RIGHT HAND  Result Value Ref Range Status    Specimen Description BLOOD RIGHT HAND  Final   Special Requests   Final    BOTTLES DRAWN AEROBIC AND ANAEROBIC Blood Culture results may not be optimal due to an excessive volume of blood received in culture bottles   Culture   Final    NO GROWTH < 12 HOURS Performed at [facility], [address] [place], [place] [address]    Report Status PENDING  Incomplete     Studies: Ct Head Wo Contrast  Result Date: 02/04/2019 CLINICAL DATA:  85 year old male with increased confusion. EXAM: CT HEAD WITHOUT CONTRAST TECHNIQUE: Contiguous axial images were obtained from the base of the skull through the vertex without intravenous contrast. COMPARISON:  None. FINDINGS: Brain: Nonspecific lateral and 3rd ventricular enlargement, but probably ex vacuo related. The cerebral aqueduct and 4th ventricle appear normal. No transependymal edema suspected. There is disproportionate atrophy of the anterior and mesial temporal lobes. Patchy bilateral cerebral white matter hypodensity, most pronounced in the corona radiata. Some associated  heterogeneity of the left thalamus. No acute intracranial hemorrhage identified. No cortically based acute infarct identified. No midline shift, mass effect, or evidence of intracranial mass lesion. Vascular: Calcified atherosclerosis at the skull base. No suspicious intracranial vascular hyperdensity. Dominant appearing right vertebral artery. Skull: No acute osseous abnormality identified. Sinuses/Orbits: Mild fluid and mucosal thickening in the left maxillary sinus. Other visualized paranasal sinuses and mastoids are well pneumatized. Other: Postoperative changes to the globes. No acute orbit or scalp soft tissue finding. IMPRESSION: 1. No acute intracranial abnormality identified. 2. Evidence of chronic small vessel disease with superimposed disproportionate atrophy of the temporal lobes, and ventricular enlargement which is probably ex vacuo. 3. Mild inflammatory changes in  the left maxillary sinus. Electronically Signed   By: Odessa Fleming M.D.   On: 02/04/2019 22:44   Dg Chest Portable 1 View  Result Date: 02/05/2019 CLINICAL DATA:  Altered mental status EXAM: PORTABLE CHEST 1 VIEW COMPARISON:  None. FINDINGS: Patchy multifocal areas of interstitial and airspace opacity throughout both lungs predominantly in the right perihilar region and left periphery. No pneumothorax or effusion. Aorta is calcified and slightly tortuous. Cardiomediastinal contours are otherwise unremarkable. Surgical clips are present in the right upper quadrant, likely prior cholecystectomy. Degenerative changes are present in the imaged spine and shoulders. No acute osseous or soft tissue abnormality is evident. IMPRESSION: Patchy multifocal areas of interstitial and airspace opacity throughout both lungs, suspicious for multifocal pneumonia. Electronically Signed   By: Kreg Shropshire M.D.   On: 02/05/2019 00:40    Scheduled Meds: . acetaminophen  500 mg Oral TID  . aspirin EC  81 mg Oral Daily  . cholecalciferol  5,000 Units Oral Daily  . famotidine  10 mg Oral Daily  . heparin  5,000 Units Subcutaneous Q8H  . Melatonin  5 mg Oral QHS  . pantoprazole  40 mg Oral Daily  . senna  1 tablet Oral Daily  . simvastatin  20 mg Oral QHS  . tamsulosin  0.4 mg Oral Daily  . traZODone  50 mg Oral QHS   Continuous Infusions: . sodium chloride 75 mL/hr at 02/05/19 0657  . [START ON 02/06/2019] azithromycin    . [START ON 02/06/2019] cefTRIAXone (ROCEPHIN)  IV      Assessment/Plan:  1. Clinical sepsis with multifocal pneumonia, acute encephalopathy and acute kidney injury.  Antibiotics with Rocephin and Zithromax.  Follow-up cultures 2. Acute encephalopathy with underlying dementia.  Mental status as per daughter-in-law better this afternoon.  When I saw him this morning he only answered a few questions.  As per daughter-in-law at baseline mental status he is he is able to take care of himself knows when he  has to go the bathroom recognizes his children and eats well. 3. BPH on Flomax 4. Hyperlipidemia on simvastatin 5. GERD on famotidine  Code Status:     Code Status Orders  (From admission, onward)         Start     Ordered   02/05/19 0221  Full code  Continuous     02/05/19 0222        Code Status History    Date Active Date Inactive Code Status Order ID Comments User Context   10/09/2017 0359 10/10/2017 2010 Full Code [id number]  Arnaldo Natal, MD Inpatient   Advance Care Planning Activity    Advance Directive Documentation     Most Recent Value  Type of Advance Directive  Healthcare Power of Attorney  Pre-existing out of facility DNR  order (yellow form or pink MOST form)  -  "MOST" Form in Place?  -     Family Communication: Spoke with daughter-in-law on the phone Disposition Plan: Likely will need a few days of IV antibiotics  Antibiotics:  Rocephin  Zithromax  Time spent: 37 minutes for coordination of care speaking with nursing staff, daughter-in-law  Loletha Grayer  [employer]

## 2019-02-05 NOTE — ED Notes (Signed)
Patient placed on bed alarm.

## 2019-02-05 NOTE — NC FL2 (Signed)
[place] LEVEL OF CARE SCREENING TOOL     IDENTIFICATION  Patient Name: Paul Bryan Birthdate: Mar 23, 1930 Sex: male Admission Date (Current Location): 02/04/2019  [place] and [place] Number:  Engineering geologist and Address:  [facility], [address] [place], [place] [address]      Provider Number: [id number]  Attending Physician Name and Address:  Loletha Grayer, MD  Relative Name and Phone Number:       Current Level of Care: Hospital Recommended Level of Care: [place], Memory Care Prior Approval Number:    Date Approved/Denied:   PASRR Number:    Discharge Plan: Domiciliary (Rest home)(Memory Care)    Current Diagnoses: Primary: Dementia  Patient Active Problem List   Diagnosis Date Noted  . Altered mental status 02/05/2019  . GIB (gastrointestinal bleeding) 10/09/2017    Orientation RESPIRATION BLADDER Height & Weight     Self  Normal Continent Weight: 175 lb 8 oz (79.6 kg) Height:  5\' 8"  (172.7 cm)  BEHAVIORAL SYMPTOMS/MOOD NEUROLOGICAL BOWEL NUTRITION STATUS      Continent Diet(Diet: Heart Healthy)  AMBULATORY STATUS COMMUNICATION OF NEEDS Skin   Limited Assist Verbally Normal                       Personal Care Assistance Level of Assistance  Bathing, Feeding, Dressing Bathing Assistance: Limited assistance Feeding assistance: Limited assistance Dressing Assistance: Limited assistance     Functional Limitations Info  Sight, Hearing, Speech Sight Info: Adequate Hearing Info: Adequate Speech Info: Adequate    SPECIAL CARE FACTORS FREQUENCY                       Contractures      Additional Factors Info  Code Status, Allergies Code Status Info: Full Code. Allergies Info: No Known Allergies.           Current Medications (02/05/2019):  This is the current hospital active medication list Current Facility-Administered Medications  Medication Dose Route Frequency  Provider Last Rate Last Dose  . 0.9 %  sodium chloride infusion   Intravenous Continuous Lang Snow, NP 75 mL/hr at 02/05/19 [phone]    . acetaminophen (TYLENOL) tablet 500 mg  500 mg Oral TID Lang Snow, NP   500 mg at 02/05/19 0939  . aspirin EC tablet 81 mg  81 mg Oral Daily Lang Snow, NP   81 mg at 02/05/19 1055  . [START ON 02/06/2019] azithromycin (ZITHROMAX) 500 mg in sodium chloride 0.9 % 250 mL IVPB  500 mg Intravenous Q24H Lang Snow, NP      . Derrill Memo ON 02/06/2019] cefTRIAXone (ROCEPHIN) 2 g in sodium chloride 0.9 % 100 mL IVPB  2 g Intravenous Q24H Lang Snow, NP      . cholecalciferol (VITAMIN D3) tablet 5,000 Units  5,000 Units Oral Daily Ouma, Bing Neighbors, NP      . famotidine (PEPCID) tablet 10 mg  10 mg Oral Daily Lang Snow, NP   10 mg at 02/05/19 1155  . heparin injection 5,000 Units  5,000 Units Subcutaneous Q8H Lang Snow, NP   5,000 Units at 02/05/19 [phone]  . labetalol (NORMODYNE) injection 10 mg  10 mg Intravenous Q2H PRN Lang Snow, NP   10 mg at 02/05/19 0837  . Melatonin TABS 5 mg  5 mg Oral QHS Ouma, Bing Neighbors, NP      . pantoprazole (PROTONIX)  EC tablet 40 mg  40 mg Oral Daily Jimmye Norman, NP   40 mg at 02/05/19 1055  . senna (SENOKOT) tablet 8.6 mg  1 tablet Oral Daily Jimmye Norman, NP   8.6 mg at 02/05/19 1055  . simvastatin (ZOCOR) tablet 20 mg  20 mg Oral QHS Jimmye Norman, NP      . tamsulosin (FLOMAX) capsule 0.4 mg  0.4 mg Oral Daily Jimmye Norman, NP   0.4 mg at 02/05/19 1055  . traMADol (ULTRAM) tablet 50 mg  50 mg Oral Q12H PRN Jimmye Norman, NP      . traZODone (DESYREL) tablet 50 mg  50 mg Oral QHS Jimmye Norman, NP         Discharge Medications: Please see discharge summary for a list of discharge medications.  Relevant Imaging Results:  Relevant Lab Results:   Additional  Information SSN: [id number]  Conlin Brahm, Darleen Crocker, LCSW

## 2019-02-05 NOTE — ED Notes (Signed)
Date and time results received: 02/05/19 0015 (use smartphrase ".now" to insert current time)  Test: Lactic Acid Critical Value: 2.8  Name of Provider Notified: Charna Archer, MD  Orders Received? Or Actions Taken?: Orders Received - See Orders for details

## 2019-02-06 LAB — HIV ANTIBODY (ROUTINE TESTING W REFLEX): HIV Screen 4th Generation wRfx: NONREACTIVE

## 2019-02-06 MED ORDER — QUETIAPINE FUMARATE 25 MG PO TABS
12.5000 mg | ORAL_TABLET | Freq: Every day | ORAL | Status: DC
  Administered 2019-02-06: 21:00:00 12.5 mg via ORAL
  Filled 2019-02-06: qty 1

## 2019-02-06 MED ORDER — HALOPERIDOL LACTATE 5 MG/ML IJ SOLN
1.0000 mg | Freq: Four times a day (QID) | INTRAMUSCULAR | Status: DC | PRN
  Administered 2019-02-06 – 2019-02-07 (×2): 1 mg via INTRAVENOUS
  Filled 2019-02-06: qty 1

## 2019-02-06 MED ORDER — AMLODIPINE BESYLATE 5 MG PO TABS
2.5000 mg | ORAL_TABLET | Freq: Every day | ORAL | Status: DC
  Administered 2019-02-06 – 2019-02-08 (×3): 2.5 mg via ORAL
  Filled 2019-02-06 (×3): qty 1

## 2019-02-06 MED ORDER — HALOPERIDOL LACTATE 5 MG/ML IJ SOLN
INTRAMUSCULAR | Status: AC
  Administered 2019-02-06: 1 mg via INTRAVENOUS
  Filled 2019-02-06: qty 1

## 2019-02-06 MED ORDER — FAMOTIDINE 20 MG PO TABS
40.0000 mg | ORAL_TABLET | Freq: Every day | ORAL | Status: DC
  Administered 2019-02-06: 40 mg via ORAL

## 2019-02-06 MED ORDER — FAMOTIDINE 20 MG PO TABS
40.0000 mg | ORAL_TABLET | ORAL | Status: DC
  Administered 2019-02-08: 40 mg via ORAL
  Filled 2019-02-06: qty 2

## 2019-02-06 MED ORDER — ASPIRIN 81 MG PO CHEW
81.0000 mg | CHEWABLE_TABLET | Freq: Every day | ORAL | Status: DC
  Administered 2019-02-06 – 2019-02-08 (×3): 81 mg via ORAL
  Filled 2019-02-06 (×3): qty 1

## 2019-02-06 MED ORDER — INFLUENZA VAC A&B SA ADJ QUAD 0.5 ML IM PRSY
0.5000 mL | PREFILLED_SYRINGE | INTRAMUSCULAR | Status: AC
  Administered 2019-02-08: 0.5 mL via INTRAMUSCULAR
  Filled 2019-02-06: qty 0.5

## 2019-02-06 NOTE — Progress Notes (Signed)
Patient ID: Paul Bryan, male   DOB: 1929/09/30, 85 y.o.   MRN: [id number]  [facility] PROGRESS NOTE  Paul Bryan IOE:[id number] DOB: 08-07-1929 DOA: 02/04/2019 PCP: Kandyce Rud, MD  HPI/Subjective: Patient more alert today and answers some questions.  Did not sleep last night as per the aide.  Objective: Vitals:   02/05/19 2002 02/06/19 0406  BP: (!) 186/102 (!) 156/94  Pulse: 87 74  Resp: 19 19  Temp: 99 F (37.2 C) 98.2 F (36.8 C)  SpO2: 100% 97%    Intake/Output Summary (Last 24 hours) at 02/06/2019 1441 Last data filed at 02/06/2019 1230 Gross per 24 hour  Intake 1795.15 ml  Output 1650 ml  Net 145.15 ml   Filed Weights   02/04/19 2053 02/05/19 0731  Weight: 81.6 kg 79.6 kg    ROS: Review of Systems  Unable to perform ROS: Dementia  Respiratory: Negative for shortness of breath.   Cardiovascular: Negative for chest pain.  Gastrointestinal: Negative for abdominal pain.   Exam: Physical Exam  HENT:  Nose: No mucosal edema.  Mouth/Throat: No oropharyngeal exudate or posterior oropharyngeal edema.  Eyes: Pupils are equal, round, and reactive to light. Conjunctivae, EOM and lids are normal.  Neck: No JVD present. Carotid bruit is not present. No edema present. No thyroid mass and no thyromegaly present.  Cardiovascular: S1 normal and S2 normal. Exam reveals no gallop.  No murmur heard. Respiratory: No respiratory distress. He has decreased breath sounds in the right lower field and the left lower field. He has no wheezes. He has rhonchi in the right lower field and the left lower field. He has no rales.  GI: Soft. Bowel sounds are normal. There is no abdominal tenderness.  Musculoskeletal:     Right ankle: He exhibits no swelling.     Left ankle: He exhibits no swelling.  Lymphadenopathy:    He has no cervical adenopathy.  Neurological: He is alert.  Skin: Skin is warm. No rash noted. Nails show no clubbing.  Psychiatric: He has a normal mood and  affect.      Data Reviewed: Basic Metabolic Panel: Recent Labs  Lab 02/04/19 2046 02/05/19 0530  NA 137  --   K 3.7  --   CL 102  --   CO2 23  --   GLUCOSE 104*  --   BUN 23  --   CREATININE 1.81* 1.65*  CALCIUM 9.1  --    CBC: Recent Labs  Lab 02/04/19 2046  WBC 7.8  NEUTROABS 4.6  HGB 14.3  HCT 43.2  MCV 85.5  PLT 307     Recent Results (from the past 240 hour(s))  SARS Coronavirus 2 [facility] order, Performed in [facility] hospital lab) Nasopharyngeal     Status: None   Collection Time: 02/04/19 11:40 PM   Specimen: Nasopharyngeal  Result Value Ref Range Status   SARS Coronavirus 2 NEGATIVE NEGATIVE Final    Comment: (NOTE) If result is NEGATIVE SARS-CoV-2 target nucleic acids are NOT DETECTED. The SARS-CoV-2 RNA is generally detectable in upper and lower  respiratory specimens during the acute phase of infection. The lowest  concentration of SARS-CoV-2 viral copies this assay can detect is 250  copies / mL. A negative result does not preclude SARS-CoV-2 infection  and should not be used as the sole basis for treatment or other  patient management decisions.  A negative result may occur with  improper specimen collection / handling, submission of specimen other  than  nasopharyngeal swab, presence of viral mutation(s) within the  areas targeted by this assay, and inadequate number of viral copies  (<250 copies / mL). A negative result must be combined with clinical  observations, patient history, and epidemiological information. If result is POSITIVE SARS-CoV-2 target nucleic acids are DETECTED. The SARS-CoV-2 RNA is generally detectable in upper and lower  respiratory specimens dur ing the acute phase of infection.  Positive  results are indicative of active infection with SARS-CoV-2.  Clinical  correlation with patient history and other diagnostic information is  necessary to determine patient infection status.  Positive results do  not rule out  bacterial infection or co-infection with other viruses. If result is PRESUMPTIVE POSTIVE SARS-CoV-2 nucleic acids MAY BE PRESENT.   A presumptive positive result was obtained on the submitted specimen  and confirmed on repeat testing.  While 2019 novel coronavirus  (SARS-CoV-2) nucleic acids may be present in the submitted sample  additional confirmatory testing may be necessary for epidemiological  and / or clinical management purposes  to differentiate between  SARS-CoV-2 and other Sarbecovirus currently known to infect humans.  If clinically indicated additional testing with an alternate test  methodology [phone] is advised. The SARS-CoV-2 RNA is generally  detectable in upper and lower respiratory sp ecimens during the acute  phase of infection. The expected result is Negative. Fact Sheet for Patients:  BoilerBrush.com.cyhttps://www.fda.gov/media/136312/download Fact Sheet for Healthcare Providers: https://pope.com/https://www.fda.gov/media/136313/download This test is not yet approved or cleared by the [place] FDA and has been authorized for detection and/or diagnosis of SARS-CoV-2 by FDA under an Emergency Use Authorization (EUA).  This EUA will remain in effect (meaning this test can be used) for the duration of the COVID-19 declaration under Section 564(b)(1) of the Act, 21 U.S.C. section 360bbb-3(b)(1), unless the authorization is terminated or revoked sooner. Performed at [facility], [address] [place], [place] [address]   Culture, blood (routine x 2)     Status: None (Preliminary result)   Collection Time: 02/05/19 12:48 AM   Specimen: Left Antecubital; Blood  Result Value Ref Range Status   Specimen Description LEFT ANTECUBITAL  Final   Special Requests   Final    BOTTLES DRAWN AEROBIC AND ANAEROBIC Blood Culture adequate volume   Culture   Final    NO GROWTH 1 DAY Performed at [facility], [address] [place], [place] [address]    Report Status PENDING   Incomplete  Culture, blood (routine x 2)     Status: None (Preliminary result)   Collection Time: 02/05/19  1:00 AM   Specimen: BLOOD RIGHT HAND  Result Value Ref Range Status   Specimen Description BLOOD RIGHT HAND  Final   Special Requests   Final    BOTTLES DRAWN AEROBIC AND ANAEROBIC Blood Culture results may not be optimal due to an excessive volume of blood received in culture bottles   Culture   Final    NO GROWTH 1 DAY Performed at [facility], [address] [place], [place] [address]    Report Status PENDING  Incomplete     Studies: Ct Head Wo Contrast  Result Date: 02/04/2019 CLINICAL DATA:  85 year old male with increased confusion. EXAM: CT HEAD WITHOUT CONTRAST TECHNIQUE: Contiguous axial images were obtained from the base of the skull through the vertex without intravenous contrast. COMPARISON:  None. FINDINGS: Brain: Nonspecific lateral and 3rd ventricular enlargement, but probably ex vacuo related. The cerebral aqueduct and 4th ventricle appear normal. No transependymal edema  suspected. There is disproportionate atrophy of the anterior and mesial temporal lobes. Patchy bilateral cerebral white matter hypodensity, most pronounced in the corona radiata. Some associated heterogeneity of the left thalamus. No acute intracranial hemorrhage identified. No cortically based acute infarct identified. No midline shift, mass effect, or evidence of intracranial mass lesion. Vascular: Calcified atherosclerosis at the skull base. No suspicious intracranial vascular hyperdensity. Dominant appearing right vertebral artery. Skull: No acute osseous abnormality identified. Sinuses/Orbits: Mild fluid and mucosal thickening in the left maxillary sinus. Other visualized paranasal sinuses and mastoids are well pneumatized. Other: Postoperative changes to the globes. No acute orbit or scalp soft tissue finding. IMPRESSION: 1. No acute intracranial abnormality identified. 2. Evidence of chronic  small vessel disease with superimposed disproportionate atrophy of the temporal lobes, and ventricular enlargement which is probably ex vacuo. 3. Mild inflammatory changes in the left maxillary sinus. Electronically Signed   By: Genevie Ann M.D.   On: 02/04/2019 22:44   Dg Chest Portable 1 View  Result Date: 02/05/2019 CLINICAL DATA:  Altered mental status EXAM: PORTABLE CHEST 1 VIEW COMPARISON:  None. FINDINGS: Patchy multifocal areas of interstitial and airspace opacity throughout both lungs predominantly in the right perihilar region and left periphery. No pneumothorax or effusion. Aorta is calcified and slightly tortuous. Cardiomediastinal contours are otherwise unremarkable. Surgical clips are present in the right upper quadrant, likely prior cholecystectomy. Degenerative changes are present in the imaged spine and shoulders. No acute osseous or soft tissue abnormality is evident. IMPRESSION: Patchy multifocal areas of interstitial and airspace opacity throughout both lungs, suspicious for multifocal pneumonia. Electronically Signed   By: Lovena Le M.D.   On: 02/05/2019 00:40    Scheduled Meds: . acetaminophen  500 mg Oral TID  . aspirin  81 mg Oral Daily  . cholecalciferol  5,000 Units Oral Daily  . [START ON 02/08/2019] famotidine  40 mg Oral QODAY  . heparin  5,000 Units Subcutaneous Q8H  . Melatonin  5 mg Oral QHS  . QUEtiapine  12.5 mg Oral QHS  . senna  1 tablet Oral Daily  . simvastatin  20 mg Oral QHS  . tamsulosin  0.4 mg Oral Daily  . traZODone  50 mg Oral QHS   Continuous Infusions: . sodium chloride 30 mL/hr at 02/06/19 1119  . azithromycin 250 mL/hr at 02/06/19 0300  . cefTRIAXone (ROCEPHIN)  IV Stopped (02/06/19 0128)    Assessment/Plan:  1. Clinical sepsis with multifocal pneumonia, acute encephalopathy and acute kidney injury.  Antibiotics with Rocephin and Zithromax.  Follow-up cultures 2. Acute encephalopathy with underlying dementia.  Will give a dose of low-dose  Seroquel tonight to get him sleeping so hopefully reset his time clock so his mental status will be better. 3. Acute kidney injury on chronic kidney disease stage III.  Patient on gentle IV fluids.  Check a BMP tomorrow morning. 4. BPH on Flomax 5. Hyperlipidemia on simvastatin 6. GERD on famotidine  Code Status:     Code Status Orders  (From admission, onward)         Start     Ordered   02/05/19 0221  Full code  Continuous     02/05/19 0222        Code Status History    Date Active Date Inactive Code Status Order ID Comments User Context   10/09/2017 0359 10/10/2017 2010 Full Code [id number]  Harrie Foreman, MD Inpatient   Advance Care Planning Activity    Advance Directive Documentation  Most Recent Value  Type of Advance Directive  Healthcare Power of Attorney  Pre-existing out of facility DNR order (yellow form or pink MOST form)  -  "MOST" Form in Place?  -     Family Communication: Spoke with daughter-in-law on the phone Disposition Plan: Reassess daily  Antibiotics:  Rocephin  Zithromax  Time spent: 27-minute  Jaleia Hanke [employer]

## 2019-02-06 NOTE — TOC Progression Note (Addendum)
Transition of Care [facility]) - Progression Note    Patient Details  Name: Paul Bryan MRN: [id number] Date of Birth: Sep 19, 1929  [facility]) CM/SW Contact  Oyinkansola Truax, Lenice Llamas Phone Number: [phone]  02/06/2019, 9:45 AM  Clinical Narrative: PT is recommending home health. Clinical Education officer, museum (CSW) contacted patient's son Janeice Robinson and made him aware of above. Per Janeice Robinson him and his brother Eddie Dibbles share HPOA for patient. Per Janeice Robinson patient's wife Mardene Celeste passed away in 08-29-17. Per Janeice Robinson his wife Patty Sermons is patient's visitor at the hospital. Janeice Robinson is agreeable to home health and does not have a preference of a home health agency. Janeice Robinson requested that patient be set up with a home health agency that [facility] prefers. Janeice Robinson is agreeable for patient to return to [facility] and requested EMS for transport. Per [facility] they prefer [facility] for home health. Per Verda Cumins home health representative they can accept patient. CSW will continue to follow and assist as needed.    10:11 am: Per [place] Mam director a [facility] staff member is coming to assess patient today. Sharee Pimple requested another negative covid test before patient returns to [facility]. MD aware of above.   12:15 pm: Rudi Heap wellness director came to assess patient today and reported that [facility] can accept patient back.     Expected Discharge Plan: Memory Care Barriers to Discharge: Continued Medical Work up  Expected Discharge Plan and Services Expected Discharge Plan: Memory Care In-house Referral: Clinical Social Work     Living arrangements for the past 2 months: [place]                                       Social Determinants of Health (SDOH) Interventions    Readmission Risk Interventions No flowsheet data found.

## 2019-02-07 LAB — BASIC METABOLIC PANEL
Anion gap: 13 (ref 5–15)
BUN: 18 mg/dL (ref 8–23)
CO2: 23 mmol/L (ref 22–32)
Calcium: 9 mg/dL (ref 8.9–10.3)
Chloride: 107 mmol/L (ref 98–111)
Creatinine, Ser: 1.42 mg/dL — ABNORMAL HIGH (ref 0.61–1.24)
GFR calc Af Amer: 51 mL/min — ABNORMAL LOW (ref >60)
GFR calc non Af Amer: 44 mL/min — ABNORMAL LOW (ref >60)
Glucose, Bld: 106 mg/dL — ABNORMAL HIGH (ref 70–99)
Potassium: 3.4 mmol/L — ABNORMAL LOW (ref 3.5–5.1)
Sodium: 143 mmol/L (ref 135–145)

## 2019-02-07 LAB — CBC
HCT: 41.9 % (ref 39.0–52.0)
Hemoglobin: 14.1 g/dL (ref 13.0–17.0)
MCH: 28.3 pg (ref 26.0–34.0)
MCHC: 33.7 g/dL (ref 30.0–36.0)
MCV: 84.1 fL (ref 80.0–100.0)
Platelets: 294 10*3/uL (ref 150–400)
RBC: 4.98 MIL/uL (ref 4.22–5.81)
RDW: 14.4 % (ref 11.5–15.5)
WBC: 12 10*3/uL — ABNORMAL HIGH (ref 4.0–10.5)
nRBC: 0 % (ref 0.0–0.2)

## 2019-02-07 LAB — SARS CORONAVIRUS 2 (TAT 6-24 HRS): SARS Coronavirus 2: NEGATIVE

## 2019-02-07 MED ORDER — AZITHROMYCIN 500 MG PO TABS
500.0000 mg | ORAL_TABLET | Freq: Every day | ORAL | Status: DC
  Administered 2019-02-08: 500 mg via ORAL
  Filled 2019-02-07: qty 1

## 2019-02-07 MED ORDER — QUETIAPINE FUMARATE 25 MG PO TABS
25.0000 mg | ORAL_TABLET | Freq: Every day | ORAL | Status: DC
  Administered 2019-02-07: 21:00:00 25 mg via ORAL
  Filled 2019-02-07: qty 1

## 2019-02-07 NOTE — Progress Notes (Signed)
Patient ID: Paul RanStowe N Cockerham, male   DOB: [birth year], 85 y.o.   MRN: [id number]  [facility] PROGRESS NOTE  Paul Bryan:[id number] DOB: [birth year] DOA: 02/04/2019 PCP: Kandyce RudBabaoff, Marcus, MD  HPI/Subjective: Patient awakened from sleep and answered a few questions.  Offers no complaints.  As per the aide he did not sleep very well last night.  He did eat a good breakfast and was able to walk to the bathroom.  Objective: Vitals:   02/07/19 0632 02/07/19 0850  BP: (!) 155/69 122/84  Pulse: 76 73  Resp: 18 16  Temp: 98.3 F (36.8 C)   SpO2: 100% 97%    Intake/Output Summary (Last 24 hours) at 02/07/2019 1335 Last data filed at 02/07/2019 0900 Gross per 24 hour  Intake 1176.55 ml  Output 1100 ml  Net 76.55 ml   Filed Weights   02/04/19 2053 02/05/19 0731  Weight: 81.6 kg 79.6 kg    ROS: Review of Systems  Unable to perform ROS: Dementia  Respiratory: Negative for shortness of breath.   Cardiovascular: Negative for chest pain.  Gastrointestinal: Negative for abdominal pain.   Exam: Physical Exam  HENT:  Nose: No mucosal edema.  Mouth/Throat: No oropharyngeal exudate or posterior oropharyngeal edema.  Eyes: Pupils are equal, round, and reactive to light. Conjunctivae, EOM and lids are normal.  Neck: No JVD present. Carotid bruit is not present. No edema present. No thyroid mass and no thyromegaly present.  Cardiovascular: S1 normal and S2 normal. Exam reveals no gallop.  No murmur heard. Respiratory: No respiratory distress. He has decreased breath sounds in the right lower field and the left lower field. He has no wheezes. He has no rhonchi. He has no rales.  GI: Soft. Bowel sounds are normal. There is no abdominal tenderness.  Musculoskeletal:     Right ankle: He exhibits no swelling.     Left ankle: He exhibits no swelling.  Lymphadenopathy:    He has no cervical adenopathy.  Neurological: He is alert.  Skin: Skin is warm. No rash noted. Nails show no clubbing.   Psychiatric: He has a normal mood and affect.      Data Reviewed: Basic Metabolic Panel: Recent Labs  Lab 02/04/19 2046 02/05/19 0530 02/07/19 0424  NA 137  --  143  K 3.7  --  3.4*  CL 102  --  107  CO2 23  --  23  GLUCOSE 104*  --  106*  BUN 23  --  18  CREATININE 1.81* 1.65* 1.42*  CALCIUM 9.1  --  9.0   CBC: Recent Labs  Lab 02/04/19 2046 02/07/19 0424  WBC 7.8 12.0*  NEUTROABS 4.6  --   HGB 14.3 14.1  HCT 43.2 41.9  MCV 85.5 84.1  PLT 307 294     Recent Results (from the past 240 hour(s))  SARS Coronavirus 2 [facility](Hospital order, Performed in [facility] hospital lab) Nasopharyngeal     Status: None   Collection Time: 02/04/19 11:40 PM   Specimen: Nasopharyngeal  Result Value Ref Range Status   SARS Coronavirus 2 NEGATIVE NEGATIVE Final    Comment: (NOTE) If result is NEGATIVE SARS-CoV-2 target nucleic acids are NOT DETECTED. The SARS-CoV-2 RNA is generally detectable in upper and lower  respiratory specimens during the acute phase of infection. The lowest  concentration of SARS-CoV-2 viral copies this assay can detect is 250  copies / mL. A negative result does not preclude SARS-CoV-2 infection  and should not be used as  the sole basis for treatment or other  patient management decisions.  A negative result may occur with  improper specimen collection / handling, submission of specimen other  than nasopharyngeal swab, presence of viral mutation(s) within the  areas targeted by this assay, and inadequate number of viral copies  (<250 copies / mL). A negative result must be combined with clinical  observations, patient history, and epidemiological information. If result is POSITIVE SARS-CoV-2 target nucleic acids are DETECTED. The SARS-CoV-2 RNA is generally detectable in upper and lower  respiratory specimens dur ing the acute phase of infection.  Positive  results are indicative of active infection with SARS-CoV-2.  Clinical  correlation with patient  history and other diagnostic information is  necessary to determine patient infection status.  Positive results do  not rule out bacterial infection or co-infection with other viruses. If result is PRESUMPTIVE POSTIVE SARS-CoV-2 nucleic acids MAY BE PRESENT.   A presumptive positive result was obtained on the submitted specimen  and confirmed on repeat testing.  While 2019 novel coronavirus  (SARS-CoV-2) nucleic acids may be present in the submitted sample  additional confirmatory testing may be necessary for epidemiological  and / or clinical management purposes  to differentiate between  SARS-CoV-2 and other Sarbecovirus currently known to infect humans.  If clinically indicated additional testing with an alternate test  methodology [phone]) is advised. The SARS-CoV-2 RNA is generally  detectable in upper and lower respiratory sp ecimens during the acute  phase of infection. The expected result is Negative. Fact Sheet for Patients:  BoilerBrush.com.cy Fact Sheet for Healthcare Providers: https://pope.com/ This test is not yet approved or cleared by the [place] FDA and has been authorized for detection and/or diagnosis of SARS-CoV-2 by FDA under an Emergency Use Authorization (EUA).  This EUA will remain in effect (meaning this test can be used) for the duration of the COVID-19 declaration under Section 564(b)(1) of the Act, 21 U.S.C. section 360bbb-3(b)(1), unless the authorization is terminated or revoked sooner. Performed at [facility], [address] [place], [place] [address]   Culture, blood (routine x 2)     Status: None (Preliminary result)   Collection Time: 02/05/19 12:48 AM   Specimen: Left Antecubital; Blood  Result Value Ref Range Status   Specimen Description LEFT ANTECUBITAL  Final   Special Requests   Final    BOTTLES DRAWN AEROBIC AND ANAEROBIC Blood Culture adequate volume   Culture   Final     NO GROWTH 2 DAYS Performed at [facility], [address], [place], [place] [address]    Report Status PENDING  Incomplete  Culture, blood (routine x 2)     Status: None (Preliminary result)   Collection Time: 02/05/19  1:00 AM   Specimen: BLOOD RIGHT HAND  Result Value Ref Range Status   Specimen Description BLOOD RIGHT HAND  Final   Special Requests   Final    BOTTLES DRAWN AEROBIC AND ANAEROBIC Blood Culture results may not be optimal due to an excessive volume of blood received in culture bottles   Culture   Final    NO GROWTH 2 DAYS Performed at [facility], [address] [place], [place] [address]    Report Status PENDING  Incomplete      Scheduled Meds: . acetaminophen  500 mg Oral TID  . amLODipine  2.5 mg Oral Daily  . aspirin  81 mg Oral Daily  . [START ON 02/08/2019] azithromycin  500 mg Oral Q0600  .  cholecalciferol  5,000 Units Oral Daily  . [START ON 02/08/2019] famotidine  40 mg Oral QODAY  . heparin  5,000 Units Subcutaneous Q8H  . influenza vaccine adjuvanted  0.5 mL Intramuscular Tomorrow-1000  . Melatonin  5 mg Oral QHS  . QUEtiapine  25 mg Oral QHS  . senna  1 tablet Oral Daily  . simvastatin  20 mg Oral QHS  . tamsulosin  0.4 mg Oral Daily  . traZODone  50 mg Oral QHS   Continuous Infusions: . sodium chloride Stopped (02/06/19 1234)  . cefTRIAXone (ROCEPHIN)  IV 2 g (02/07/19 0146)    Assessment/Plan:  1. Clinical sepsis with multifocal pneumonia, acute encephalopathy.  Continue Rocephin and Zithromax 2. Acute encephalopathy with underlying dementia.  Try to get him sleeping tonight with an increased dose of Seroquel. 3. Acute kidney injury on chronic kidney disease stage III.  Creatinine improved to 1.42.  Discontinue IV fluids. 4. BPH on Flomax 5. Hyperlipidemia on simvastatin 6. GERD on famotidine 7. Hypertension low-dose Norvasc.  Code Status:     Code Status Orders  (From admission, onward)         Start      Ordered   02/05/19 0221  Full code  Continuous     02/05/19 0222        Code Status History    Date Active Date Inactive Code Status Order ID Comments User Context   10/09/2017 0359 10/10/2017 2010 Full Code [id number]  Harrie Foreman, MD Inpatient   Advance Care Planning Activity    Advance Directive Documentation     Most Recent Value  Type of Advance Directive  Healthcare Power of Attorney  Pre-existing out of facility DNR order (yellow form or pink MOST form)  -  "MOST" Form in Place?  -     Family Communication: Spoke with daughter-in-law and son on the phone Disposition Plan: Evaluate daily on disposition.  Potentially tomorrow.  Antibiotics:  Rocephin  Zithromax  Time spent: 28 minute  [place]

## 2019-02-07 NOTE — Progress Notes (Signed)
PHARMACIST - PHYSICIAN COMMUNICATION  DR:   Leslye Peer CONCERNING: Antibiotic IV to Oral Route Change Policy  RECOMMENDATION: This patient is receiving azithromycin by the intravenous route.  Based on criteria approved by the Pharmacy and Therapeutics Committee, the antibiotic(s) is/are being converted to the equivalent oral dose form(s).   DESCRIPTION: These criteria include:  Patient being treated for a respiratory tract infection, urinary tract infection, cellulitis or clostridium difficile associated diarrhea if on metronidazole  The patient is not neutropenic and does not exhibit a GI malabsorption state  The patient is eating (either orally or via tube) and/or has been taking other orally administered medications for a least 24 hours  The patient is improving clinically and has a Tmax < 100.5  If you have questions about this conversion, please contact the Pharmacy Department   Dorena Bodo, PharmD

## 2019-02-07 NOTE — Progress Notes (Signed)
Physical Therapy Treatment Patient Details Name: Paul Bryan MRN: [id number] DOB: 14-Feb-1930 Today's Date: 02/07/2019    History of Present Illness Per MD: 85 y.o. male presented to the ED with altered mental status, restless so history is very limited. Per ED reports, patient presented from [facility] with confusion and speech difficulty. Per staff at [facility], patient has underlying Dementia and confused at baseline, he was noted to be more confused with word finding difficulty which was described as word salad staff and patient's son.  Given that this was not patient's baseline, he was transported to the ED for further evaluation and management. MD dx includes, sepsis with multifocal pneumonia, acute encephalopathy and acute kidney injury. PMH includes: Alzheimer's disease/underlying dementia, hypertension, OSA, hyperlipidemia, BPH, GERD, GI bleed    PT Comments    Pt presented with deficits in strength, transfers, mobility, gait, and balance. Pt was lethargic at beginning of session became more alert/aware with ambulation, but went right back to sleep when sat in recliner, and did not report any pain throughout. Pt needed min A for mobility and was able to follow commands safely. Pt required mod A and cuing to complete sit<>stand, and CGA for safety during ambulation. Per grand daughter, pt is very active in his assisted living community, so he was not fully returned to baseline. Pt will benefit from HHPT upon discharge to safely address above deficits for decreased caregiver assistance and eventual return to PLOF.   Follow Up Recommendations  Home health PT     Equipment Recommendations  None recommended by PT    Recommendations for Other Services       Precautions / Restrictions Precautions Precautions: Fall Restrictions Weight Bearing Restrictions: No    Mobility  Bed Mobility Overal bed mobility: Needs Assistance Bed Mobility: Supine to Sit     Supine to sit: Min  assist     General bed mobility comments: Cuing and help clearing feet over EOB  Transfers Overall transfer level: Needs assistance Equipment used: Rolling walker (2 wheeled)   Sit to Stand: Mod assist         General transfer comment: Mod cuing and A to stand to RW  Ambulation/Gait Ambulation/Gait assistance: Min guard Gait Distance (Feet): 75 Feet Assistive device: Rolling walker (2 wheeled) Gait Pattern/deviations: Step-through pattern;Decreased step length - right;Decreased step length - left;Decreased stride length Gait velocity: Decreased   General Gait Details: Pt maintained a slow and steady step through pace, required cuing to make turns to return to room and to stay closer to the [employer]             Wheelchair Mobility    Modified Rankin (Stroke Patients Only)       Balance Overall balance assessment: Mild deficits observed, not formally tested                                          Cognition Arousal/Alertness: Lethargic Behavior During Therapy: Flat affect Overall Cognitive Status: Difficult to assess                                 General Comments: Per nurse/sitter, he was in and out of consciousness and when he was awake had been doing much better, communicating and getting up to use the bathroom  Exercises Total Joint Exercises Ankle Circles/Pumps: AROM;Strengthening;Both;10 reps;15 reps Long Arc Quad: AROM;Strengthening;Both;10 reps;15 reps Knee Flexion: AROM;Strengthening;Both;5 reps;10 reps Marching in Standing: AROM;Strengthening;Both;10 reps;15 reps    General Comments        Pertinent Vitals/Pain Pain Assessment: No/denies pain    Home Living                      Prior Function            PT Goals (current goals can now be found in the care plan section) Acute Rehab PT Goals Patient Stated Goal: Return to walking independently PT Goal Formulation: With family Time For  Goal Achievement: 02/16/19 Potential to Achieve Goals: Fair Progress towards PT goals: Progressing toward goals    Frequency    Min 2X/week      PT Plan      Co-evaluation              AM-PAC PT "6 Clicks" Mobility   Outcome Measure  Help needed turning from your back to your side while in a flat bed without using bedrails?: A Little Help needed moving from lying on your back to sitting on the side of a flat bed without using bedrails?: A Lot Help needed moving to and from a bed to a chair (including a wheelchair)?: A Little Help needed standing up from a chair using your arms (e.g., wheelchair or bedside chair)?: A Little Help needed to walk in hospital room?: A Little Help needed climbing 3-5 steps with a railing? : A Lot 6 Click Score: 16    End of Session Equipment Utilized During Treatment: Gait belt   Patient left: in chair;with call bell/phone within reach;with chair alarm set;with nursing/sitter in room Nurse Communication: Mobility status PT Visit Diagnosis: Unsteadiness on feet (R26.81);Difficulty in walking, not elsewhere classified (R26.2);Muscle weakness (generalized) (M62.81)     Time: 3154-0086 PT Time Calculation (min) (ACUTE ONLY): 23 min  Charges:                        Juanda Crumble "Gus" Jeannette Corpus, SPT  02/07/19, 1:59 PM

## 2019-02-08 MED ORDER — AZITHROMYCIN 500 MG PO TABS: ORAL_TABLET | ORAL | 0 refills | Status: AC

## 2019-02-08 MED ORDER — LORAZEPAM 0.5 MG PO TABS: 0.5000 mg | ORAL_TABLET | Freq: Two times a day (BID) | ORAL | 0 refills | Status: DC | PRN

## 2019-02-08 MED ORDER — TRAMADOL HCL 50 MG PO TABS: 50.0000 mg | ORAL_TABLET | Freq: Four times a day (QID) | ORAL | 0 refills | Status: DC | PRN

## 2019-02-08 MED ORDER — AMLODIPINE BESYLATE 2.5 MG PO TABS: 2.5000 mg | ORAL_TABLET | Freq: Every day | ORAL | 0 refills | Status: DC

## 2019-02-08 MED ORDER — QUETIAPINE FUMARATE 25 MG PO TABS: 25.0000 mg | ORAL_TABLET | Freq: Every evening | ORAL | 0 refills | Status: DC | PRN

## 2019-02-08 MED ORDER — CEFDINIR 300 MG PO CAPS: 300.0000 mg | ORAL_CAPSULE | Freq: Two times a day (BID) | ORAL | 0 refills | Status: DC

## 2019-02-08 NOTE — TOC Progression Note (Addendum)
Transition of Care [facility]) - Progression Note    Patient Details  Name: Paul Bryan MRN: [id number] Date of Birth: April 23, 1930  Transition of Care [facility]) CM/SW [place], LCSW Phone Number: 02/08/2019, 10:20 AM  Clinical Narrative: Tawni Carnes and discharge summary to ALF to review prior to discharge.    11:40 am: ALF is reviewing paperwork now. [facility] is aware of discharge for today and asked that RN be added to the home health order. Will notify MD.  Expected Discharge Plan: Memory Care Barriers to Discharge: Continued Medical Work up  Expected Discharge Plan and Services Expected Discharge Plan: Memory Care In-house Referral: Clinical Social Work     Living arrangements for the past 2 months: [place] Expected Discharge Date: 02/08/19                                     Social Determinants of Health (SDOH) Interventions    Readmission Risk Interventions No flowsheet data found.

## 2019-02-08 NOTE — Progress Notes (Signed)
Patient discharged. Report given to [facility] at facility. EMS called for transport

## 2019-02-08 NOTE — Discharge Summary (Signed)
[facility] [place] NAME: Paul Bryan    MR#:  [id number]  DATE OF BIRTH:  May 27, 1929  DATE OF ADMISSION:  02/04/2019 ADMITTING PHYSICIAN: Lang Snow, NP  DATE OF DISCHARGE: 02/08/2019  PRIMARY CARE PHYSICIAN: Derinda Late, MD    ADMISSION DIAGNOSIS:  Altered mental status, unspecified altered mental status type [R41.82] Community acquired pneumonia, unspecified laterality [J18.9]  DISCHARGE DIAGNOSIS:  Active Problems:   Altered mental status   SECONDARY DIAGNOSIS:   Past Medical History:  Diagnosis Date  . Hypertension   . Restless leg syndrome     HOSPITAL COURSE:   1.  Clinical sepsis with multifocal pneumonia and acute encephalopathy.  The patient was given Rocephin and Zithromax while here in the hospital.  Upon discharge he will have 7 more doses of Omnicef starting tonight this medication is twice a day.  He will have 1 more dose of Zithromax starting tomorrow 02/09/2019. 2.  Acute encephalopathy with underlying dementia.  The patient was lethargic most of the hospital course.  I finally got him sleeping with Seroquel increased dose last night.  Patient's mental status is better this morning. 3.  Acute kidney injury on chronic kidney disease stage III.  Creatinine had improved to 1.42 upon discharge. 4.  BPH on Flomax 5.  Hyperlipidemia on simvastatin 6.  GERD on famotidine 7.  Hypertension.  Started low-dose Norvasc. 8.  Family states that he does use a CPAP at night  DISCHARGE CONDITIONS:   Satisfactory  CONSULTS OBTAINED:  None  DRUG ALLERGIES:  No Known Allergies  DISCHARGE MEDICATIONS:   Allergies as of 02/08/2019   No Known Allergies     Medication List    TAKE these medications   acetaminophen 500 MG tablet Commonly known as: TYLENOL Take 500 mg by mouth 3 (three) times daily.   amLODipine 2.5 MG tablet Commonly known as: NORVASC Take 1 tablet (2.5 mg total) by mouth daily.    aspirin EC 81 MG tablet Take 81 mg by mouth daily.   azithromycin 500 MG tablet Commonly known as: ZITHROMAX One tab po  for one more dose on 02/09/2019 Start taking on: February 09, 2019   cefdinir 300 MG capsule Commonly known as: OMNICEF Take 1 capsule (300 mg total) by mouth 2 (two) times daily.   famotidine 40 MG tablet Commonly known as: PEPCID Take 40 mg by mouth daily.   furosemide 20 MG tablet Commonly known as: LASIX Take 20 mg by mouth daily.   galantamine 8 MG tablet Commonly known as: RAZADYNE Take 8 mg by mouth daily.   Klor-Con M10 10 MEQ tablet Generic drug: potassium chloride Take 10 mEq by mouth 2 (two) times daily.   LORazepam 0.5 MG tablet Commonly known as: ATIVAN Take 1 tablet (0.5 mg total) by mouth 2 (two) times daily as needed for anxiety.   Melatonin 5 MG Tabs Take 1 tablet by mouth at bedtime.   pantoprazole 40 MG tablet Commonly known as: PROTONIX Take 40 mg by mouth daily.   QUEtiapine 25 MG tablet Commonly known as: SEROQUEL Take 1 tablet (25 mg total) by mouth at bedtime as needed (insomnia or agitation).   senna 8.6 MG Tabs tablet Commonly known as: SENOKOT Take 1 tablet by mouth daily.   simvastatin 20 MG tablet Commonly known as: ZOCOR Take 20 mg by mouth at bedtime.   tamsulosin 0.4 MG Caps capsule Commonly known as: FLOMAX Take 0.4 mg by mouth daily.  traMADol 50 MG tablet Commonly known as: ULTRAM Take 1 tablet (50 mg total) by mouth every 6 (six) hours as needed. What changed: how much to take   traZODone 50 MG tablet Commonly known as: DESYREL Take 50 mg by mouth at bedtime.   VITAMIN D (ERGOCALCIFEROL) PO Take 5,000 Units by mouth daily.        DISCHARGE INSTRUCTIONS:   Follow-up PMD 5 days Home health PT  If you experience worsening of your admission symptoms, develop shortness of breath, life threatening emergency, suicidal or homicidal thoughts you must seek medical attention immediately by calling  911 or calling your MD immediately  if symptoms less severe.  You Must read complete instructions/literature along with all the possible adverse reactions/side effects for all the Medicines you take and that have been prescribed to you. Take any new Medicines after you have completely understood and accept all the possible adverse reactions/side effects.   Please note  You were cared for by a hospitalist during your hospital stay. If you have any questions about your discharge medications or the care you received while you were in the hospital after you are discharged, you can call the unit and asked to speak with the hospitalist on call if the hospitalist that took care of you is not available. Once you are discharged, your primary care physician will handle any further medical issues. Please note that NO REFILLS for any discharge medications will be authorized once you are discharged, as it is imperative that you return to your primary care physician (or establish a relationship with a primary care physician if you do not have one) for your aftercare needs so that they can reassess your need for medications and monitor your lab values.    Today   CHIEF COMPLAINT:   Chief Complaint  Patient presents with  . Altered Mental Status    HISTORY OF PRESENT ILLNESS:  Paul Bryan  is a 85 y.o. male brought in with altered mental status   VITAL SIGNS:  Blood pressure (!) 147/68, pulse 75, temperature (!) 97.4 F (36.3 C), temperature source Oral, resp. rate 18, height  (1.727 m), weight 79.6 kg, SpO2 98 %.  I/O:    Intake/Output Summary (Last 24 hours) at 02/08/2019 0948 Last data filed at 02/07/2019 1700 Gross per 24 hour  Intake 797.7 ml  Output -  Net 797.7 ml    PHYSICAL EXAMINATION:  GENERAL:  85 y.o.-year-old patient lying in the bed with no acute distress.  EYES: Pupils equal, round, reactive to light and accommodation. No scleral icterus. Extraocular muscles intact.   HEENT: Head atraumatic, normocephalic. Oropharynx and nasopharynx clear.  NECK:  Supple, no jugular venous distention. No thyroid enlargement, no tenderness.  LUNGS: Normal breath sounds bilaterally, no wheezing, rales,rhonchi or crepitation. No use of accessory muscles of respiration.  CARDIOVASCULAR: S1, S2 normal. No murmurs, rubs, or gallops.  ABDOMEN: Soft, non-tender, non-distended. Bowel sounds present. No organomegaly or mass.  EXTREMITIES: No pedal edema, cyanosis, or clubbing.  NEUROLOGIC: Cranial nerves II through XII are intact. Muscle strength 5/5 in all extremities. Sensation intact. Gait not checked.  PSYCHIATRIC: The patient is alert and answers some questions and follows commands.  SKIN: No obvious rash, lesion, or ulcer.   DATA REVIEW:   CBC Recent Labs  Lab 02/07/19 0424  WBC 12.0*  HGB 14.1  HCT 41.9  PLT 294    Chemistries  Recent Labs  Lab 02/07/19 0424  NA 143  K 3.4*  CL 107  CO2 23  GLUCOSE 106*  BUN 18  CREATININE 1.42*  CALCIUM 9.0    Microbiology Results  Results for orders placed or performed during the hospital encounter of 02/04/19  SARS Coronavirus 2 [facility](Hospital order, Performed in [facility] hospital lab) Nasopharyngeal     Status: None   Collection Time: 02/04/19 11:40 PM   Specimen: Nasopharyngeal  Result Value Ref Range Status   SARS Coronavirus 2 NEGATIVE NEGATIVE Final    Comment: (NOTE) If result is NEGATIVE SARS-CoV-2 target nucleic acids are NOT DETECTED. The SARS-CoV-2 RNA is generally detectable in upper and lower  respiratory specimens during the acute phase of infection. The lowest  concentration of SARS-CoV-2 viral copies this assay can detect is 250  copies / mL. A negative result does not preclude SARS-CoV-2 infection  and should not be used as the sole basis for treatment or other  patient management decisions.  A negative result may occur with  improper specimen collection / handling, submission of specimen other   than nasopharyngeal swab, presence of viral mutation(s) within the  areas targeted by this assay, and inadequate number of viral copies  (<250 copies / mL). A negative result must be combined with clinical  observations, patient history, and epidemiological information. If result is POSITIVE SARS-CoV-2 target nucleic acids are DETECTED. The SARS-CoV-2 RNA is generally detectable in upper and lower  respiratory specimens dur ing the acute phase of infection.  Positive  results are indicative of active infection with SARS-CoV-2.  Clinical  correlation with patient history and other diagnostic information is  necessary to determine patient infection status.  Positive results do  not rule out bacterial infection or co-infection with other viruses. If result is PRESUMPTIVE POSTIVE SARS-CoV-2 nucleic acids MAY BE PRESENT.   A presumptive positive result was obtained on the submitted specimen  and confirmed on repeat testing.  While 2019 novel coronavirus  (SARS-CoV-2) nucleic acids may be present in the submitted sample  additional confirmatory testing may be necessary for epidemiological  and / or clinical management purposes  to differentiate between  SARS-CoV-2 and other Sarbecovirus currently known to infect humans.  If clinically indicated additional testing with an alternate test  methodology [phone] is advised. The SARS-CoV-2 RNA is generally  detectable in upper and lower respiratory sp ecimens during the acute  phase of infection. The expected result is Negative. Fact Sheet for Patients:  BoilerBrush.com.cyhttps://www.fda.gov/media/136312/download Fact Sheet for Healthcare Providers: https://pope.com/https://www.fda.gov/media/136313/download This test is not yet approved or cleared by the [place] FDA and has been authorized for detection and/or diagnosis of SARS-CoV-2 by FDA under an Emergency Use Authorization (EUA).  This EUA will remain in effect (meaning this test can be used) for the duration of  the COVID-19 declaration under Section 564(b)(1) of the Act, 21 U.S.C. section 360bbb-3(b)(1), unless the authorization is terminated or revoked sooner. Performed at [facility], [address] [place], [place] [address]   Culture, blood (routine x 2)     Status: None (Preliminary result)   Collection Time: 02/05/19 12:48 AM   Specimen: Left Antecubital; Blood  Result Value Ref Range Status   Specimen Description LEFT ANTECUBITAL  Final   Special Requests   Final    BOTTLES DRAWN AEROBIC AND ANAEROBIC Blood Culture adequate volume   Culture   Final    NO GROWTH 3 DAYS Performed at [facility], [address] [place], [place] [address]    Report Status PENDING  Incomplete  Culture, blood (  routine x 2)     Status: None (Preliminary result)   Collection Time: 02/05/19  1:00 AM   Specimen: BLOOD RIGHT HAND  Result Value Ref Range Status   Specimen Description BLOOD RIGHT HAND  Final   Special Requests   Final    BOTTLES DRAWN AEROBIC AND ANAEROBIC Blood Culture results may not be optimal due to an excessive volume of blood received in culture bottles   Culture   Final    NO GROWTH 3 DAYS Performed at [facility], [address] [place], [place] [address]    Report Status PENDING  Incomplete  SARS CORONAVIRUS 2 (TAT 6-24 HRS) Nasopharyngeal Nasopharyngeal Swab     Status: None   Collection Time: 02/07/19 12:46 PM   Specimen: Nasopharyngeal Swab  Result Value Ref Range Status   SARS Coronavirus 2 NEGATIVE NEGATIVE Final    Comment: (NOTE) SARS-CoV-2 target nucleic acids are NOT DETECTED. The SARS-CoV-2 RNA is generally detectable in upper and lower respiratory specimens during the acute phase of infection. Negative results do not preclude SARS-CoV-2 infection, do not rule out co-infections with other pathogens, and should not be used as the sole basis for treatment or other patient management decisions. Negative results must be combined with  clinical observations, patient history, and epidemiological information. The expected result is Negative. Fact Sheet for Patients: HairSlick.no Fact Sheet for Healthcare Providers: quierodirigir.com This test is not yet approved or cleared by the [place] FDA and  has been authorized for detection and/or diagnosis of SARS-CoV-2 by FDA under an Emergency Use Authorization (EUA). This EUA will remain  in effect (meaning this test can be used) for the duration of the COVID-19 declaration under Section 56 4(b)(1) of the Act, 21 U.S.C. section 360bbb-3(b)(1), unless the authorization is terminated or revoked sooner. Performed at [facility], [address], [place], [place] [address]       Management plans discussed with the patient, family and they are in agreement.  CODE STATUS:     Code Status Orders  (From admission, onward)         Start     Ordered   02/05/19 0221  Full code  Continuous     02/05/19 0222        Code Status History    Date Active Date Inactive Code Status Order ID Comments User Context   10/09/2017 0359 10/10/2017 2010 Full Code [id number]  Arnaldo Natal, MD Inpatient   Advance Care Planning Activity    Advance Directive Documentation     Most Recent Value  Type of Advance Directive  Healthcare Power of Attorney  Pre-existing out of facility DNR order (yellow form or pink MOST form)  -  "MOST" Form in Place?  -      TOTAL TIME TAKING CARE OF THIS PATIENT: 35 minutes.    Alford Highland M.D on 02/08/2019 at 9:48 AM  Between 7am to 6pm - Pager - [phone]  After 6pm go to [email or web address] - password [email or web address]  [employer] Office  [phone]  CC: Primary care physician; Kandyce Rud, MD

## 2019-02-08 NOTE — Care Management Important Message (Signed)
Important Message  Patient Details  Name: Paul Bryan MRN: [id number] Date of Birth: 09/11/1929   Medicare Important Message Given:  Yes     [name] 02/08/2019, 11:48 AM

## 2019-02-08 NOTE — [place] FL2 (Signed)
[place] MEDICAID FL2 LEVEL OF CARE SCREENING TOOL     IDENTIFICATION  Patient Name: Paul Bryan Birthdate: 1929-07-29 Sex: male Admission Date (Current Location): 02/04/2019  [place] and [place] Number:  Chiropodist and Address:  [facility], [address] [place], [place] [address]      Provider Number: [id number]  Attending Physician Name and Address:  Alford Highland, MD  Relative Name and Phone Number:       Current Level of Care: Hospital Recommended Level of Care: Assisted Living Facility, Memory Care(with PT ([facility]).) Prior Approval Number:    Date Approved/Denied:   PASRR Number:    Discharge Plan: Other (Comment)(ALF Memory Care Unit with PT ([facility]))    Current Diagnoses: Patient Active Problem List   Diagnosis Date Noted  . Altered mental status 02/05/2019  . GIB (gastrointestinal bleeding) 10/09/2017    Orientation RESPIRATION BLADDER Height & Weight     Self  Normal Continent Weight: 175 lb 8 oz (79.6 kg) Height:  5\' 8"  (172.7 cm)  BEHAVIORAL SYMPTOMS/MOOD NEUROLOGICAL BOWEL NUTRITION STATUS  (None) (Memory Impairment) Continent Diet(Heart healthy)  AMBULATORY STATUS COMMUNICATION OF NEEDS Skin   Limited Assist Verbally Bruising, Other (Comment)(Rash.)                       Personal Care Assistance Level of Assistance  Bathing, Feeding, Dressing Bathing Assistance: Limited assistance Feeding assistance: Limited assistance Dressing Assistance: Limited assistance     Functional Limitations Info  Sight, Hearing, Speech Sight Info: Adequate Hearing Info: Adequate Speech Info: Adequate    SPECIAL CARE FACTORS FREQUENCY  PT (By licensed PT)     PT Frequency: 3 x week              Contractures Contractures Info: Not present    Additional Factors Info  Code Status, Allergies Code Status Info: Full code Allergies Info: NKDA           Current Medications (02/08/2019):  This is the  current hospital active medication list Current Facility-Administered Medications  Medication Dose Route Frequency Provider Last Rate Last Dose  . acetaminophen (TYLENOL) tablet 500 mg  500 mg Oral TID 04/10/2019, NP   500 mg at 02/07/19 2032  . amLODipine (NORVASC) tablet 2.5 mg  2.5 mg Oral Daily 2033, MD   2.5 mg at 02/07/19 1004  . aspirin chewable tablet 81 mg  81 mg Oral Daily 02/09/19, MD   81 mg at 02/07/19 1003  . azithromycin (ZITHROMAX) tablet 500 mg  500 mg Oral Q0600 02/09/19, RPH      . cefTRIAXone (ROCEPHIN) 2 g in sodium chloride 0.9 % 100 mL IVPB  2 g Intravenous Q24H Pricilla Riffle, NP 200 mL/hr at 02/08/19 0201 2 g at 02/08/19 0201  . cholecalciferol (VITAMIN D3) tablet 5,000 Units  5,000 Units Oral Daily 04/10/19, NP   5,000 Units at 02/07/19 1006  . famotidine (PEPCID) tablet 40 mg  40 mg Oral QODAY Wieting, Richard, MD      . haloperidol lactate (HALDOL) injection 1 mg  1 mg Intravenous Q6H PRN 02/09/19, MD   1 mg at 02/07/19 0037  . heparin injection 5,000 Units  5,000 Units Subcutaneous Q8H 02/09/19, NP   5,000 Units at 02/08/19 [phone]  . influenza vaccine adjuvanted (FLUAD) injection 0.5 mL  0.5 mL Intramuscular Tomorrow-1000 Wieting, Richard, MD      . labetalol (NORMODYNE)  injection 10 mg  10 mg Intravenous Q2H PRN Lang Snow, NP   10 mg at 02/06/19 1603  . Melatonin TABS 5 mg  5 mg Oral QHS Lang Snow, NP   5 mg at 02/07/19 2033  . QUEtiapine (SEROQUEL) tablet 25 mg  25 mg Oral QHS Loletha Grayer, MD   25 mg at 02/07/19 2033  . senna (SENOKOT) tablet 8.6 mg  1 tablet Oral Daily Lang Snow, NP   8.6 mg at 02/07/19 1005  . simvastatin (ZOCOR) tablet 20 mg  20 mg Oral QHS Lang Snow, NP   20 mg at 02/07/19 2032  . tamsulosin (FLOMAX) capsule 0.4 mg  0.4 mg Oral Daily Lang Snow, NP   0.4 mg at 02/07/19 1002  . traMADol  (ULTRAM) tablet 50 mg  50 mg Oral Q12H PRN Lang Snow, NP      . traZODone (DESYREL) tablet 50 mg  50 mg Oral QHS Lang Snow, NP   50 mg at 02/07/19 2033     Discharge Medications: TAKE these medications   acetaminophen 500 MG tablet Commonly known as: TYLENOL Take 500 mg by mouth 3 (three) times daily.   amLODipine 2.5 MG tablet Commonly known as: NORVASC Take 1 tablet (2.5 mg total) by mouth daily.   aspirin EC 81 MG tablet Take 81 mg by mouth daily.   azithromycin 500 MG tablet Commonly known as: ZITHROMAX One tab po  for one more dose on 02/09/2019 Start taking on: February 09, 2019   cefdinir 300 MG capsule Commonly known as: OMNICEF Take 1 capsule (300 mg total) by mouth 2 (two) times daily.   famotidine 40 MG tablet Commonly known as: PEPCID Take 40 mg by mouth daily.   furosemide 20 MG tablet Commonly known as: LASIX Take 20 mg by mouth daily.   galantamine 8 MG tablet Commonly known as: RAZADYNE Take 8 mg by mouth daily.   Klor-Con M10 10 MEQ tablet Generic drug: potassium chloride Take 10 mEq by mouth 2 (two) times daily.   LORazepam 0.5 MG tablet Commonly known as: ATIVAN Take 1 tablet (0.5 mg total) by mouth 2 (two) times daily as needed for anxiety.   Melatonin 5 MG Tabs Take 1 tablet by mouth at bedtime.   pantoprazole 40 MG tablet Commonly known as: PROTONIX Take 40 mg by mouth daily.   QUEtiapine 25 MG tablet Commonly known as: SEROQUEL Take 1 tablet (25 mg total) by mouth at bedtime as needed (insomnia or agitation).   senna 8.6 MG Tabs tablet Commonly known as: SENOKOT Take 1 tablet by mouth daily.   simvastatin 20 MG tablet Commonly known as: ZOCOR Take 20 mg by mouth at bedtime.   tamsulosin 0.4 MG Caps capsule Commonly known as: FLOMAX Take 0.4 mg by mouth daily.   traMADol 50 MG tablet Commonly known as: ULTRAM Take 1 tablet (50 mg total) by mouth every 6 (six) hours as needed. What  changed: how much to take   traZODone 50 MG tablet Commonly known as: DESYREL Take 50 mg by mouth at bedtime.   VITAMIN D (ERGOCALCIFEROL) PO Take 5,000 Units by mouth daily.     Relevant Imaging Results:  Relevant Lab Results:   Additional Information SS#: [id number]  Candie Chroman, LCSW

## 2019-02-08 NOTE — TOC Transition Note (Signed)
Transition of Care [facility]) - CM/SW Discharge Note   Patient Details  Name: LONZELL DORRIS MRN: [id number] Date of Birth: 01/10/1930  [facility]) CM/SW Contact:  Candie Chroman, LCSW Phone Number: 02/08/2019, 12:01 PM   Clinical Narrative: Patient has orders to discharge to [place] today. Samantha at the facility has reviewed and approved all documentation. RN will call report to [phone] prior to setting up EMS transport. CSW left son a voicemail to provide update. No further concerns. CSW signing off.    Final next level of care: Assisted Living(with home health PT and RN.) Barriers to Discharge: Barriers Resolved   Patient Goals and CMS Choice        Discharge Placement                Patient to be transferred to facility by: EMS Name of family member notified: Left voicemail for son, Terique Kawabata. MD spoke with him this morning. Patient and family notified of of transfer: 02/08/19  Discharge Plan and Services In-house Referral: Clinical Social Work                        HH Arranged: Therapist, sports, PT [place] Agency: [place] Date [place]: 02/08/19   Representative spoke with at [place]: [employer] ([place]) Interventions     Readmission Risk Interventions No flowsheet data found.

## 2019-02-10 LAB — CULTURE, BLOOD (ROUTINE X 2)
Culture: NO GROWTH
Culture: NO GROWTH
Special Requests: ADEQUATE

## 2020-03-14 ENCOUNTER — Emergency Department: Payer: Medicare Other

## 2020-03-14 ENCOUNTER — Emergency Department
Admission: EM | Admit: 2020-03-14 | Discharge: 2020-03-14 | Disposition: A | Discharge status: Home / Self Care | Payer: Medicare Other | Attending: Emergency Medicine | Admitting: Emergency Medicine

## 2020-03-14 DIAGNOSIS — R600 Localized edema: Secondary | ICD-10-CM | POA: Insufficient documentation

## 2020-03-14 DIAGNOSIS — I10 Essential (primary) hypertension: Secondary | ICD-10-CM | POA: Insufficient documentation

## 2020-03-14 DIAGNOSIS — Z79899 Other long term (current) drug therapy: Secondary | ICD-10-CM | POA: Diagnosis not present

## 2020-03-14 DIAGNOSIS — F0390 Unspecified dementia without behavioral disturbance: Secondary | ICD-10-CM | POA: Diagnosis not present

## 2020-03-14 DIAGNOSIS — R609 Edema, unspecified: Secondary | ICD-10-CM | POA: Diagnosis not present

## 2020-03-14 DIAGNOSIS — Z87891 Personal history of nicotine dependence: Secondary | ICD-10-CM | POA: Diagnosis not present

## 2020-03-14 DIAGNOSIS — Z7982 Long term (current) use of aspirin: Secondary | ICD-10-CM | POA: Diagnosis not present

## 2020-03-14 DIAGNOSIS — M79604 Pain in right leg: Secondary | ICD-10-CM | POA: Diagnosis present

## 2020-03-14 LAB — BASIC METABOLIC PANEL
Anion gap: 11 (ref 5–15)
BUN: 24 mg/dL — ABNORMAL HIGH (ref 8–23)
CO2: 22 mmol/L (ref 22–32)
Calcium: 8.2 mg/dL — ABNORMAL LOW (ref 8.9–10.3)
Chloride: 106 mmol/L (ref 98–111)
Creatinine, Ser: 1.74 mg/dL — ABNORMAL HIGH (ref 0.61–1.24)
GFR, Estimated: 37 mL/min — ABNORMAL LOW (ref >60)
Glucose, Bld: 136 mg/dL — ABNORMAL HIGH (ref 70–99)
Potassium: 4.7 mmol/L (ref 3.5–5.1)
Sodium: 139 mmol/L (ref 135–145)

## 2020-03-14 LAB — CBC WITH DIFFERENTIAL/PLATELET
Abs Immature Granulocytes: 0.05 10*3/uL (ref 0.00–0.07)
Basophils Absolute: 0.1 10*3/uL (ref 0.0–0.1)
Basophils Relative: 1 %
Eosinophils Absolute: 0.3 10*3/uL (ref 0.0–0.5)
Eosinophils Relative: 4 %
HCT: 38.2 % — ABNORMAL LOW (ref 39.0–52.0)
Hemoglobin: 12.2 g/dL — ABNORMAL LOW (ref 13.0–17.0)
Immature Granulocytes: 1 %
Lymphocytes Relative: 21 %
Lymphs Abs: 1.6 10*3/uL (ref 0.7–4.0)
MCH: 27.1 pg (ref 26.0–34.0)
MCHC: 31.9 g/dL (ref 30.0–36.0)
MCV: 84.7 fL (ref 80.0–100.0)
Monocytes Absolute: 1.2 10*3/uL — ABNORMAL HIGH (ref 0.1–1.0)
Monocytes Relative: 16 %
Neutro Abs: 4.4 10*3/uL (ref 1.7–7.7)
Neutrophils Relative %: 57 %
Platelets: 269 10*3/uL (ref 150–400)
RBC: 4.51 MIL/uL (ref 4.22–5.81)
RDW: 15.3 % (ref 11.5–15.5)
WBC: 7.7 10*3/uL (ref 4.0–10.5)
nRBC: 0 % (ref 0.0–0.2)

## 2020-03-14 LAB — BRAIN NATRIURETIC PEPTIDE: B Natriuretic Peptide: 43.8 pg/mL (ref 0.0–100.0)

## 2020-03-14 LAB — TROPONIN I (HIGH SENSITIVITY): Troponin I (High Sensitivity): 7 ng/L (ref <18)

## 2020-03-14 NOTE — ED Notes (Signed)
Helped pt get dressed RN called [facility] [phone] who reports they (pt's son's wife) who reports they are out of town.  RN called [facility] [phone] spoke with Alecia Lemming who reports they don't have transportation for pt, asked to arranged transportation

## 2020-03-14 NOTE — ED Notes (Signed)
Pt assisted to bathroom at this time.

## 2020-03-14 NOTE — ED Provider Notes (Signed)
[facility] Emergency Department Provider Note  ____________________________________________  Time seen: Approximately 8:16 PM  I have reviewed the triage vital signs and the nursing notes.   HISTORY  Chief Complaint Leg Swelling (right)    Level 5 Caveat: Portions of the History and Physical including HPI and review of systems are unable to be completely obtained due to patient being a poor historian   HPI Paul Bryan is a 86 y.o. male with a history of hypertension and Alzheimer's dementia and GERD who was sent to the ED for evaluation of right leg pain and swelling that was noticed this evening.  The patient denies any recollection of this and states he has no symptoms.  Denies right leg pain, denies chest pain shortness of breath or any other complaints.  He states he feels normal.      Past Medical History:  Diagnosis Date  . Hypertension   . Restless leg syndrome      Patient Active Problem List   Diagnosis Date Noted  . Altered mental status 02/05/2019  . GIB (gastrointestinal bleeding) 10/09/2017     Past Surgical History:  Procedure Laterality Date  . ABLATION    . APPENDECTOMY    . COLONOSCOPY N/A 10/10/2017   Procedure: COLONOSCOPY;  Surgeon: Wyline Mood, MD;  Location: [facility] ENDOSCOPY;  Service: Gastroenterology;  Laterality: N/A;  . ESOPHAGOGASTRODUODENOSCOPY N/A 10/10/2017   Procedure: ESOPHAGOGASTRODUODENOSCOPY (EGD);  Surgeon: Wyline Mood, MD;  Location: [facility] ENDOSCOPY;  Service: Gastroenterology;  Laterality: N/A;  . FISSURECTOMY       Prior to Admission medications   Medication Sig Start Date End Date Taking? Authorizing Provider  acetaminophen (TYLENOL) 500 MG tablet Take 500 mg by mouth 3 (three) times daily.    [provider]  amLODipine (NORVASC) 2.5 MG tablet Take 1 tablet (2.5 mg total) by mouth daily. 02/08/19   Alford Highland, MD  aspirin EC 81 MG tablet Take 81 mg by mouth daily.    [provider]  cefdinir (OMNICEF) 300 MG capsule Take 1 capsule (300 mg total) by mouth 2 (two) times daily. 02/08/19   Alford Highland, MD  famotidine (PEPCID) 40 MG tablet Take 40 mg by mouth daily.    [provider]  furosemide (LASIX) 20 MG tablet Take 20 mg by mouth daily. 07/19/17   [provider]  galantamine (RAZADYNE) 8 MG tablet Take 8 mg by mouth daily.  09/08/17   [provider]  KLOR-CON M10 10 MEQ tablet Take 10 mEq by mouth 2 (two) times daily.  09/08/17   [provider]  LORazepam (ATIVAN) 0.5 MG tablet Take 1 tablet (0.5 mg total) by mouth 2 (two) times daily as needed for anxiety. 02/08/19   Alford Highland, MD  Melatonin 5 MG TABS Take 1 tablet by mouth at bedtime.    [provider]  pantoprazole (PROTONIX) 40 MG tablet Take 40 mg by mouth daily. 08/25/17   [provider]  QUEtiapine (SEROQUEL) 25 MG tablet Take 1 tablet (25 mg total) by mouth at bedtime as needed (insomnia or agitation). 02/08/19   Alford Highland, MD  senna (SENOKOT) 8.6 MG TABS tablet Take 1 tablet by mouth daily.    [provider]  simvastatin (ZOCOR) 20 MG tablet Take 20 mg by mouth at bedtime.  09/14/17   [provider]  tamsulosin (FLOMAX) 0.4 MG CAPS capsule Take 0.4 mg by mouth daily. 09/12/17   [provider]  traMADol (ULTRAM) 50 MG tablet  Take 1 tablet (50 mg total) by mouth every 6 (six) hours as needed. 02/08/19   Alford Highland, MD  traZODone (DESYREL) 50 MG tablet Take 50 mg by mouth at bedtime.    [provider]  VITAMIN D, ERGOCALCIFEROL, PO Take 5,000 Units by mouth daily.    [provider]     Allergies Patient has no known allergies.   No family history on file.  Social History Social History   Tobacco Use  . Smoking status: Former Games developer  . Smokeless tobacco: Never Used  Substance Use Topics  . Alcohol use: Not on file  . Drug use: Not on file    Review of Systems Level 5 Caveat:  Portions of the History and Physical including HPI and review of systems are unable to be completely obtained due to patient being a poor historian   Constitutional:   No known fever.  ENT:   No rhinorrhea. Cardiovascular:   No chest pain or syncope. Respiratory:   No dyspnea or cough. Gastrointestinal:   Negative for abdominal pain, vomiting and diarrhea.  Musculoskeletal:   Right leg pain and swelling as above ____________________________________________   PHYSICAL EXAM:  VITAL SIGNS: ED Triage Vitals  Enc Vitals Group     BP 03/14/20 1922 (!) 164/82     Pulse Rate 03/14/20 1922 65     Resp 03/14/20 1922 16     Temp 03/14/20 1922 98.8 F (37.1 C)     Temp Source 03/14/20 1922 Oral     SpO2 03/14/20 1922 97 %     Weight 03/14/20 1923 176 lb 5.9 oz (80 kg)     Height 03/14/20 1923 5\' 8"  (1.727 m)     Head Circumference --      Peak Flow --      Pain Score 03/14/20 1923 0     Pain Loc --      Pain Edu? --      Excl. in GC? --     Vital signs reviewed, nursing assessments reviewed.   Constitutional:   Alert and oriented to self. Non-toxic appearance. Eyes:   Conjunctivae are normal. EOMI. PERRL. ENT      Head:   Normocephalic and atraumatic.      Nose:   No congestion/rhinnorhea.       Mouth/Throat:   MMM, no pharyngeal erythema. No peritonsillar mass.       Neck:   No meningismus. Full ROM. Hematological/Lymphatic/Immunilogical:   No cervical lymphadenopathy. Cardiovascular:   RRR. Symmetric bilateral radial and DP pulses.  No murmurs. Cap refill less than 2 seconds. Respiratory:   Normal respiratory effort without tachypnea/retractions. Breath sounds are clear and equal bilaterally. No wheezes/rales/rhonchi. Gastrointestinal:   Soft and nontender. Non distended. There is no CVA tenderness.  No rebound, rigidity, or guarding. Musculoskeletal:   Normal range of motion in all extremities. No joint effusions.  No lower extremity tenderness.  There is bilateral pitting  edema, trace on the left and 1+ on the right.  Right-sided calf circumference is 2 cm greater than the left.  Compartments are soft, negative Homans' sign, no palpable cords. Neurologic:   Normal speech and language.  Motor grossly intact. No acute focal neurologic deficits are appreciated.  Skin:    Skin is warm, dry and intact. No rash noted.  No petechiae, purpura, or bullae.  ____________________________________________    LABS (pertinent positives/negatives) (all labs ordered are listed, but only abnormal results are displayed) Labs Reviewed  CBC WITH DIFFERENTIAL/PLATELET -  Abnormal; Notable for the following components:      Result Value   Hemoglobin 12.2 (*)    HCT 38.2 (*)    Monocytes Absolute 1.2 (*)    All other components within normal limits  BASIC METABOLIC PANEL - Abnormal; Notable for the following components:   Glucose, Bld 136 (*)    BUN 24 (*)    Creatinine, Ser 1.74 (*)    Calcium 8.2 (*)    GFR, Estimated 37 (*)    All other components within normal limits  BRAIN NATRIURETIC PEPTIDE  URINALYSIS, COMPLETE (UACMP) WITH MICROSCOPIC  TROPONIN I (HIGH SENSITIVITY)   ____________________________________________   EKG    ____________________________________________    RADIOLOGY  US Venous Img Lower Unilateral Right  Result Date: 03/14/2020 CLINICAL DATA:  Right leg swelling. EXAM: RIGHT LOWER EXTREMITY VENOUS DOPPLER ULTRASOUND TECHNIQUE: Gray-scale sonography with compression, as well as color and duplex ultrasound, were performed to evaluate the deep venous system(s) from the level of the common femoral vein through the popliteal and proximal calf veins. COMPARISON:  None. FINDINGS: VENOUS Normal compressibility of the common femoral, superficial femoral, and popliteal veins, as well as the visualized calf veins. Visualized portions of profunda femoral vein and great saphenous vein unremarkable. No filling defects to suggest DVT on grayscale or color  Doppler imaging. Doppler waveforms show normal direction of venous flow, normal respiratory plasticity and response to augmentation. Limited views of the contralateral common femoral vein are unremarkable. OTHER None. Limitations: None. IMPRESSION: Negative. Electronically Signed   By: Obie DredgeWilliam T Derry M.D.   On: 03/14/2020 20:21   DG Chest Portable 1 View  Result Date: 03/14/2020 CLINICAL DATA:  Peripheral edema.  Leg swelling. EXAM: PORTABLE CHEST 1 VIEW COMPARISON:  02/05/2019 FINDINGS: Borderline cardiomegaly normal mediastinal contours with aortic atherosclerosis. No pulmonary edema. No pleural effusion or pneumothorax. No focal airspace disease. Suspected retrocardiac hiatal hernia. No acute osseous abnormalities are seen. IMPRESSION: 1. Borderline cardiomegaly without pulmonary edema. 2. Suspected retrocardiac hiatal hernia. Electronically Signed   By: Narda RutherfordMelanie  Sanford M.D.   On: 03/14/2020 20:04    ____________________________________________   PROCEDURES Procedures  ____________________________________________  DIFFERENTIAL DIAGNOSIS   DVT, non-STEMI, new onset heart failure, electrolyte abnormality, renal failure  CLINICAL IMPRESSION / ASSESSMENT AND PLAN / ED COURSE  Medications ordered in the ED: Medications - No data to display  Pertinent labs & imaging results that were available during my care of the patient were reviewed by me and considered in my medical decision making (see chart for details).   Paul Bryan was evaluated in Emergency Department on 03/14/2020 for the symptoms described in the history of present illness. He was evaluated in the context of the global COVID-19 pandemic, which necessitated consideration that the patient might be at risk for infection with the SARS-CoV-2 virus that causes COVID-19. Institutional protocols and algorithms that pertain to the evaluation of patients at risk for COVID-19 are in a state of rapid change based on information released  by regulatory bodies including the CDC and federal and state organizations. These policies and algorithms were followed during the patient's care in the ED.   Patient with dementia sent to the ED for reported right leg pain earlier today.  Patient denies any symptoms whatsoever at this point.  However, on review of electronic medical record, it is notable for recent hospitalization for pneumonia, during which time multiple exams describes the complete absence of lower extremity edema.  With new onset of edema and right leg pain,  will obtain ultrasound to evaluate for DVT, check labs and chest x-ray for signs of CHF or underlying cardiac or renal pathology.   ----------------------------------------- 8:42 PM on 03/14/2020 -----------------------------------------  Ultrasound negative for DVT, labs unremarkable, stable CKD, troponin and BNP normal.  Chest x-ray image reviewed by me and is unremarkable without pulmonary edema or pleural effusion.  Radiology report agrees that it is unremarkable.  Stable for discharge home to follow-up with PCP.     ____________________________________________   FINAL CLINICAL IMPRESSION(S) / ED DIAGNOSES    Final diagnoses:  Peripheral edema  Chronic dementia without behavioral disturbance [facility])     ED Discharge Orders    None      Portions of this note were generated with dragon dictation software. Dictation errors may occur despite best attempts at proofreading.   Sharman Cheek, MD 03/14/20 2042

## 2020-03-14 NOTE — ED Notes (Signed)
Called [facility] for transport to [facility]  [phone]

## 2020-03-14 NOTE — Discharge Instructions (Addendum)
Your ultrasound of the right leg did not show any blood clots.  Lab tests and a chest xray were all okay as well and did not show any cause for your leg swelling.  Please follow up with your primary care doctor this week for further evaluation of these symptoms.

## 2020-04-03 ENCOUNTER — Other Ambulatory Visit: Payer: Self-pay

## 2020-04-03 ENCOUNTER — Inpatient Hospital Stay
Admission: EM | Admit: 2020-04-03 | Discharge: 2020-04-11 | DRG: 177 | Disposition: A | Discharge status: Skilled Nursing Facility | Payer: Medicare Other | Source: Skilled Nursing Facility | Attending: Internal Medicine | Admitting: Internal Medicine

## 2020-04-03 ENCOUNTER — Encounter: Payer: Self-pay | Admitting: Internal Medicine

## 2020-04-03 ENCOUNTER — Emergency Department: Payer: Medicare Other

## 2020-04-03 DIAGNOSIS — G2581 Restless legs syndrome: Secondary | ICD-10-CM | POA: Diagnosis present

## 2020-04-03 DIAGNOSIS — Z79899 Other long term (current) drug therapy: Secondary | ICD-10-CM

## 2020-04-03 DIAGNOSIS — G9341 Metabolic encephalopathy: Secondary | ICD-10-CM | POA: Diagnosis not present

## 2020-04-03 DIAGNOSIS — J9600 Acute respiratory failure, unspecified whether with hypoxia or hypercapnia: Secondary | ICD-10-CM | POA: Diagnosis present

## 2020-04-03 DIAGNOSIS — Z66 Do not resuscitate: Secondary | ICD-10-CM | POA: Diagnosis present

## 2020-04-03 DIAGNOSIS — G309 Alzheimer's disease, unspecified: Secondary | ICD-10-CM | POA: Diagnosis present

## 2020-04-03 DIAGNOSIS — I10 Essential (primary) hypertension: Secondary | ICD-10-CM | POA: Diagnosis present

## 2020-04-03 DIAGNOSIS — N179 Acute kidney failure, unspecified: Secondary | ICD-10-CM | POA: Diagnosis present

## 2020-04-03 DIAGNOSIS — E871 Hypo-osmolality and hyponatremia: Secondary | ICD-10-CM | POA: Diagnosis not present

## 2020-04-03 DIAGNOSIS — N400 Enlarged prostate without lower urinary tract symptoms: Secondary | ICD-10-CM | POA: Diagnosis present

## 2020-04-03 DIAGNOSIS — J1282 Pneumonia due to coronavirus disease 2019: Secondary | ICD-10-CM | POA: Diagnosis present

## 2020-04-03 DIAGNOSIS — Z7982 Long term (current) use of aspirin: Secondary | ICD-10-CM

## 2020-04-03 DIAGNOSIS — U071 COVID-19: Principal | ICD-10-CM

## 2020-04-03 DIAGNOSIS — Z87891 Personal history of nicotine dependence: Secondary | ICD-10-CM

## 2020-04-03 DIAGNOSIS — E860 Dehydration: Secondary | ICD-10-CM | POA: Diagnosis present

## 2020-04-03 DIAGNOSIS — R059 Cough, unspecified: Secondary | ICD-10-CM

## 2020-04-03 DIAGNOSIS — I129 Hypertensive chronic kidney disease with stage 1 through stage 4 chronic kidney disease, or unspecified chronic kidney disease: Secondary | ICD-10-CM | POA: Diagnosis present

## 2020-04-03 DIAGNOSIS — Z9049 Acquired absence of other specified parts of digestive tract: Secondary | ICD-10-CM

## 2020-04-03 DIAGNOSIS — N183 Chronic kidney disease, stage 3 unspecified: Secondary | ICD-10-CM | POA: Diagnosis present

## 2020-04-03 DIAGNOSIS — J9601 Acute respiratory failure with hypoxia: Secondary | ICD-10-CM | POA: Diagnosis present

## 2020-04-03 DIAGNOSIS — N1831 Chronic kidney disease, stage 3a: Secondary | ICD-10-CM | POA: Diagnosis present

## 2020-04-03 LAB — COMPREHENSIVE METABOLIC PANEL
ALT: 25 U/L (ref 0–44)
AST: 36 U/L (ref 15–41)
Albumin: 3.3 g/dL — ABNORMAL LOW (ref 3.5–5.0)
Alkaline Phosphatase: 48 U/L (ref 38–126)
Anion gap: 12 (ref 5–15)
BUN: 27 mg/dL — ABNORMAL HIGH (ref 8–23)
CO2: 24 mmol/L (ref 22–32)
Calcium: 8.4 mg/dL — ABNORMAL LOW (ref 8.9–10.3)
Chloride: 99 mmol/L (ref 98–111)
Creatinine, Ser: 2.21 mg/dL — ABNORMAL HIGH (ref 0.61–1.24)
GFR, Estimated: 28 mL/min — ABNORMAL LOW (ref >60)
Glucose, Bld: 107 mg/dL — ABNORMAL HIGH (ref 70–99)
Potassium: 3.7 mmol/L (ref 3.5–5.1)
Sodium: 135 mmol/L (ref 135–145)
Total Bilirubin: 0.6 mg/dL (ref 0.3–1.2)
Total Protein: 6.9 g/dL (ref 6.5–8.1)

## 2020-04-03 LAB — CBC WITH DIFFERENTIAL/PLATELET
Abs Immature Granulocytes: 0.05 10*3/uL (ref 0.00–0.07)
Basophils Absolute: 0 10*3/uL (ref 0.0–0.1)
Basophils Relative: 1 %
Eosinophils Absolute: 0 10*3/uL (ref 0.0–0.5)
Eosinophils Relative: 0 %
HCT: 38.1 % — ABNORMAL LOW (ref 39.0–52.0)
Hemoglobin: 12.4 g/dL — ABNORMAL LOW (ref 13.0–17.0)
Immature Granulocytes: 1 %
Lymphocytes Relative: 15 %
Lymphs Abs: 0.9 10*3/uL (ref 0.7–4.0)
MCH: 26.9 pg (ref 26.0–34.0)
MCHC: 32.5 g/dL (ref 30.0–36.0)
MCV: 82.6 fL (ref 80.0–100.0)
Monocytes Absolute: 1.2 10*3/uL — ABNORMAL HIGH (ref 0.1–1.0)
Monocytes Relative: 19 %
Neutro Abs: 4.1 10*3/uL (ref 1.7–7.7)
Neutrophils Relative %: 64 %
Platelets: 249 10*3/uL (ref 150–400)
RBC: 4.61 MIL/uL (ref 4.22–5.81)
RDW: 15.5 % (ref 11.5–15.5)
WBC: 6.4 10*3/uL (ref 4.0–10.5)
nRBC: 0 % (ref 0.0–0.2)

## 2020-04-03 LAB — TROPONIN I (HIGH SENSITIVITY)
Troponin I (High Sensitivity): 18 ng/L — ABNORMAL HIGH (ref <18)
Troponin I (High Sensitivity): 18 ng/L — ABNORMAL HIGH (ref <18)

## 2020-04-03 LAB — LACTIC ACID, PLASMA: Lactic Acid, Venous: 1.1 mmol/L (ref 0.5–1.9)

## 2020-04-03 LAB — C-REACTIVE PROTEIN: CRP: 11.9 mg/dL — ABNORMAL HIGH (ref <1.0)

## 2020-04-03 LAB — BRAIN NATRIURETIC PEPTIDE: B Natriuretic Peptide: 105.5 pg/mL — ABNORMAL HIGH (ref 0.0–100.0)

## 2020-04-03 LAB — PROCALCITONIN: Procalcitonin: <0.1 ng/mL

## 2020-04-03 LAB — FIBRIN DERIVATIVES D-DIMER (ARMC ONLY): Fibrin derivatives D-dimer (ARMC): 1103.62 ng/mL (FEU) — ABNORMAL HIGH (ref 0.00–499.00)

## 2020-04-03 MED ORDER — ASPIRIN EC 81 MG PO TBEC
81.0000 mg | DELAYED_RELEASE_TABLET | Freq: Every day | ORAL | Status: DC
  Administered 2020-04-03 – 2020-04-11 (×8): 81 mg via ORAL
  Filled 2020-04-03 (×8): qty 1

## 2020-04-03 MED ORDER — SODIUM CHLORIDE 0.9% FLUSH
3.0000 mL | Freq: Two times a day (BID) | INTRAVENOUS | Status: DC
  Administered 2020-04-03 – 2020-04-06 (×5): 3 mL via INTRAVENOUS

## 2020-04-03 MED ORDER — GUAIFENESIN-DM 100-10 MG/5ML PO SYRP
10.0000 mL | ORAL_SOLUTION | ORAL | Status: DC | PRN
  Administered 2020-04-03 – 2020-04-08 (×6): 10 mL via ORAL
  Filled 2020-04-03 (×6): qty 10

## 2020-04-03 MED ORDER — SODIUM CHLORIDE 0.9 % IV SOLN: 250.0000 mL | INTRAVENOUS | Status: DC | PRN

## 2020-04-03 MED ORDER — QUETIAPINE FUMARATE 25 MG PO TABS
25.0000 mg | ORAL_TABLET | Freq: Every evening | ORAL | Status: DC | PRN
  Administered 2020-04-10: 25 mg via ORAL
  Filled 2020-04-03: qty 1

## 2020-04-03 MED ORDER — DEXAMETHASONE SODIUM PHOSPHATE 10 MG/ML IJ SOLN
6.0000 mg | INTRAMUSCULAR | Status: DC
  Administered 2020-04-03 – 2020-04-08 (×4): 6 mg via INTRAVENOUS
  Filled 2020-04-03 (×4): qty 1

## 2020-04-03 MED ORDER — ENOXAPARIN SODIUM 30 MG/0.3ML ~~LOC~~ SOLN
30.0000 mg | SUBCUTANEOUS | Status: DC
  Administered 2020-04-03 – 2020-04-07 (×4): 30 mg via SUBCUTANEOUS
  Filled 2020-04-03 (×5): qty 0.3

## 2020-04-03 MED ORDER — TAMSULOSIN HCL 0.4 MG PO CAPS
0.4000 mg | ORAL_CAPSULE | Freq: Every day | ORAL | Status: DC
  Administered 2020-04-03 – 2020-04-11 (×8): 0.4 mg via ORAL
  Filled 2020-04-03 (×8): qty 1

## 2020-04-03 MED ORDER — SIMVASTATIN 20 MG PO TABS
20.0000 mg | ORAL_TABLET | Freq: Every day | ORAL | Status: DC
  Administered 2020-04-03 – 2020-04-10 (×7): 20 mg via ORAL
  Filled 2020-04-03 (×6): qty 1

## 2020-04-03 MED ORDER — LORAZEPAM 0.5 MG PO TABS
0.5000 mg | ORAL_TABLET | Freq: Two times a day (BID) | ORAL | Status: DC | PRN
  Administered 2020-04-04: 0.5 mg via ORAL
  Filled 2020-04-03: qty 1

## 2020-04-03 MED ORDER — ACETAMINOPHEN 325 MG PO TABS: 650.0000 mg | ORAL_TABLET | Freq: Four times a day (QID) | ORAL | Status: DC | PRN

## 2020-04-03 MED ORDER — AMLODIPINE BESYLATE 5 MG PO TABS
2.5000 mg | ORAL_TABLET | Freq: Every day | ORAL | Status: DC
  Administered 2020-04-03 – 2020-04-11 (×8): 2.5 mg via ORAL
  Filled 2020-04-03 (×8): qty 1

## 2020-04-03 MED ORDER — ASCORBIC ACID 500 MG PO TABS
500.0000 mg | ORAL_TABLET | Freq: Every day | ORAL | Status: DC
  Administered 2020-04-03 – 2020-04-11 (×8): 500 mg via ORAL
  Filled 2020-04-03 (×8): qty 1

## 2020-04-03 MED ORDER — METHYLPREDNISOLONE SODIUM SUCC 125 MG IJ SOLR
80.0000 mg | Freq: Once | INTRAMUSCULAR | Status: AC
  Administered 2020-04-03: 80 mg via INTRAVENOUS
  Filled 2020-04-03: qty 2

## 2020-04-03 MED ORDER — GALANTAMINE HYDROBROMIDE 4 MG PO TABS
8.0000 mg | ORAL_TABLET | Freq: Every day | ORAL | Status: DC
  Administered 2020-04-03 – 2020-04-11 (×8): 8 mg via ORAL
  Filled 2020-04-03 (×9): qty 2

## 2020-04-03 MED ORDER — SODIUM CHLORIDE 0.9 % IV SOLN
200.0000 mg | Freq: Once | INTRAVENOUS | Status: AC
  Administered 2020-04-03: 200 mg via INTRAVENOUS
  Filled 2020-04-03: qty 200

## 2020-04-03 MED ORDER — POTASSIUM CHLORIDE CRYS ER 20 MEQ PO TBCR: 10.0000 meq | EXTENDED_RELEASE_TABLET | Freq: Two times a day (BID) | ORAL | Status: DC

## 2020-04-03 MED ORDER — ALBUTEROL SULFATE HFA 108 (90 BASE) MCG/ACT IN AERS
2.0000 | INHALATION_SPRAY | Freq: Four times a day (QID) | RESPIRATORY_TRACT | Status: DC
  Administered 2020-04-03 – 2020-04-11 (×13): 2 via RESPIRATORY_TRACT
  Filled 2020-04-03 (×3): qty 6.7

## 2020-04-03 MED ORDER — VITAMIN D (ERGOCALCIFEROL) 1.25 MG (50000 UNIT) PO CAPS
50000.0000 [IU] | ORAL_CAPSULE | Freq: Every day | ORAL | Status: DC
  Administered 2020-04-03 – 2020-04-11 (×5): 50000 [IU] via ORAL
  Filled 2020-04-03 (×9): qty 1

## 2020-04-03 MED ORDER — ONDANSETRON HCL 4 MG/2ML IJ SOLN: 4.0000 mg | Freq: Four times a day (QID) | INTRAMUSCULAR | Status: DC | PRN

## 2020-04-03 MED ORDER — SENNA 8.6 MG PO TABS
1.0000 | ORAL_TABLET | Freq: Every day | ORAL | Status: DC
  Administered 2020-04-05 – 2020-04-10 (×6): 8.6 mg via ORAL
  Filled 2020-04-03 (×6): qty 1

## 2020-04-03 MED ORDER — ONDANSETRON HCL 4 MG PO TABS: 4.0000 mg | ORAL_TABLET | Freq: Four times a day (QID) | ORAL | Status: DC | PRN

## 2020-04-03 MED ORDER — FUROSEMIDE 40 MG PO TABS: 20.0000 mg | ORAL_TABLET | Freq: Every day | ORAL | Status: DC

## 2020-04-03 MED ORDER — ZINC SULFATE 220 (50 ZN) MG PO CAPS
220.0000 mg | ORAL_CAPSULE | Freq: Every day | ORAL | Status: DC
  Administered 2020-04-03 – 2020-04-11 (×8): 220 mg via ORAL
  Filled 2020-04-03 (×8): qty 1

## 2020-04-03 MED ORDER — SODIUM CHLORIDE 0.9 % IV SOLN: 100.0000 mg | Freq: Every day | INTRAVENOUS | Status: DC

## 2020-04-03 MED ORDER — SODIUM CHLORIDE 0.9 % IV SOLN
200.0000 mg | Freq: Once | INTRAVENOUS | Status: DC
  Filled 2020-04-03: qty 40

## 2020-04-03 MED ORDER — MELATONIN 5 MG PO TABS
5.0000 mg | ORAL_TABLET | Freq: Every day | ORAL | Status: DC
  Administered 2020-04-03 – 2020-04-10 (×7): 5 mg via ORAL
  Filled 2020-04-03 (×8): qty 1

## 2020-04-03 MED ORDER — TRAZODONE HCL 50 MG PO TABS
50.0000 mg | ORAL_TABLET | Freq: Every day | ORAL | Status: DC
  Administered 2020-04-03 – 2020-04-10 (×7): 50 mg via ORAL
  Filled 2020-04-03 (×6): qty 1

## 2020-04-03 MED ORDER — FAMOTIDINE 20 MG PO TABS
20.0000 mg | ORAL_TABLET | Freq: Every day | ORAL | Status: DC
  Administered 2020-04-05 – 2020-04-11 (×7): 20 mg via ORAL
  Filled 2020-04-03 (×7): qty 1

## 2020-04-03 MED ORDER — DEXAMETHASONE SODIUM PHOSPHATE 10 MG/ML IJ SOLN: 6.0000 mg | INTRAMUSCULAR | Status: DC

## 2020-04-03 MED ORDER — SODIUM CHLORIDE 0.9% FLUSH: 3.0000 mL | INTRAVENOUS | Status: DC | PRN

## 2020-04-03 MED ORDER — ENOXAPARIN SODIUM 40 MG/0.4ML ~~LOC~~ SOLN: 40.0000 mg | SUBCUTANEOUS | Status: DC

## 2020-04-03 MED ORDER — FAMOTIDINE 20 MG PO TABS
40.0000 mg | ORAL_TABLET | Freq: Every day | ORAL | Status: DC
  Administered 2020-04-03: 40 mg via ORAL
  Filled 2020-04-03: qty 2

## 2020-04-03 MED ORDER — SODIUM CHLORIDE 0.9 % IV SOLN
100.0000 mg | Freq: Every day | INTRAVENOUS | Status: AC
  Administered 2020-04-05 – 2020-04-07 (×3): 100 mg via INTRAVENOUS
  Filled 2020-04-03: qty 100
  Filled 2020-04-03 (×4): qty 20

## 2020-04-03 NOTE — ED Notes (Signed)
Pt given meal tray.

## 2020-04-03 NOTE — ED Notes (Signed)
Patient set up with breakfast tray 

## 2020-04-03 NOTE — ED Provider Notes (Addendum)
[facility] Emergency Department Provider Note  ____________________________________________   First MD Initiated Contact with Patient 04/03/20 [phone]     (approximate)  I have reviewed the triage vital signs and the nursing notes.   HISTORY  Chief Complaint Weakness and Covid Positive    HPI Paul Bryan is a 86 y.o. male with history of hypertension, mild dementia, here with confusion and weakness.  The patient was reportedly diagnosed with Covid on Tuesday.  He does not recall how long he has been sick but states he believes he was sick for several days prior to this.  He states that he has felt generally weak, and short of breath.  He has had a cough, nonproductive.  He states that he feels somewhat confused, and otherwise is unable to provide additional history.  He reportedly has been more weak this morning in particular, so wife called EMS.  With EMS, he was initially satting 88-89 on room air, which improved with 2 L nasal cannula.  He denies history of lung disease or prior oxygen requirement.  He does not recall if he has been vaccinated for Covid, but does not believe he was.  No other complaints.  Medical history limited to mild confusion/dementia.        Past Medical History:  Diagnosis Date  . Hypertension   . Restless leg syndrome     Patient Active Problem List   Diagnosis Date Noted  . Pneumonia due to COVID-19 virus 04/03/2020  . Hypertension   . CKD (chronic kidney disease), stage III (HCC)   . Dehydration   . BPH (benign prostatic hyperplasia)   . Acute respiratory failure (HCC)   . Altered mental status 02/05/2019  . GIB (gastrointestinal bleeding) 10/09/2017    Past Surgical History:  Procedure Laterality Date  . ABLATION    . APPENDECTOMY    . COLONOSCOPY N/A 10/10/2017   Procedure: COLONOSCOPY;  Surgeon: Wyline Mood, MD;  Location: [facility] ENDOSCOPY;  Service: Gastroenterology;  Laterality: N/A;  . ESOPHAGOGASTRODUODENOSCOPY  N/A 10/10/2017   Procedure: ESOPHAGOGASTRODUODENOSCOPY (EGD);  Surgeon: Wyline Mood, MD;  Location: [facility] ENDOSCOPY;  Service: Gastroenterology;  Laterality: N/A;  . FISSURECTOMY      Prior to Admission medications   Medication Sig Start Date End Date Taking? Authorizing Provider  amLODipine (NORVASC) 5 MG tablet Take 5 mg by mouth daily. 07/26/19  Yes [provider]  aspirin EC 81 MG tablet Take 81 mg by mouth daily.   Yes [provider]  divalproex (DEPAKOTE ER) 250 MG 24 hr tablet Take 250 mg by mouth daily. 12/17/19  Yes [provider]  famotidine (PEPCID) 40 MG tablet Take 40 mg by mouth daily.   Yes [provider]  galantamine (RAZADYNE) 8 MG tablet Take 8 mg by mouth daily.  09/08/17  Yes [provider]  KLOR-CON M10 10 MEQ tablet Take 10 mEq by mouth 2 (two) times daily.  09/08/17  Yes [provider]  Melatonin 5 MG TABS Take 1 tablet by mouth at bedtime.   Yes [provider]  pantoprazole (PROTONIX) 40 MG tablet Take 40 mg by mouth daily. 08/25/17  Yes [provider]  senna (SENOKOT) 8.6 MG TABS tablet Take 1 tablet by mouth daily.   Yes [provider]  simvastatin (ZOCOR) 20 MG tablet Take 20 mg by mouth at bedtime.  09/14/17  Yes [provider]  tamsulosin (FLOMAX) 0.4 MG CAPS capsule Take 0.4 mg by mouth daily. 09/12/17  Yes [provider]  traZODone (DESYREL) 50 MG tablet Take 50 mg by mouth at bedtime.   Yes [provider]  VITAMIN D, ERGOCALCIFEROL, PO Take 5,000 Units by mouth daily.   Yes [provider]  acetaminophen (TYLENOL) 500 MG tablet Take 500 mg by mouth 3 (three) times daily.    [provider]  traMADol (ULTRAM) 50 MG tablet Take 1 tablet (50 mg total) by mouth every 6 (six) hours as needed. Patient not taking: Reported on 04/03/2020 02/08/19   Alford Highland, MD    Allergies Patient has no known allergies.  Family History  Family history  unknown: Yes    Social History Social History   Tobacco Use  . Smoking status: Former Games developer  . Smokeless tobacco: Never Used  Substance Use Topics  . Alcohol use: Not on file  . Drug use: Not on file    Review of Systems  Review of Systems  Constitutional: Positive for chills and fatigue. Negative for fever.  HENT: Negative for sore throat.   Respiratory: Positive for shortness of breath.   Cardiovascular: Negative for chest pain.  Gastrointestinal: Negative for abdominal pain.  Genitourinary: Negative for flank pain.  Musculoskeletal: Negative for neck pain.  Skin: Negative for rash and wound.  Allergic/Immunologic: Negative for immunocompromised state.  Neurological: Positive for weakness. Negative for numbness.  Hematological: Does not bruise/bleed easily.  All other systems reviewed and are negative.    ____________________________________________  PHYSICAL EXAM:      VITAL SIGNS: ED Triage Vitals  Enc Vitals Group     BP      Pulse      Resp      Temp      Temp src      SpO2      Weight      Height      Head Circumference      Peak Flow      Pain Score      Pain Loc      Pain Edu?      Excl. in GC?      Physical Exam Vitals and nursing note reviewed.  Constitutional:      General: He is not in acute distress.    Appearance: He is well-developed.  HENT:     Head: Normocephalic and atraumatic.  Eyes:     Conjunctiva/sclera: Conjunctivae normal.  Cardiovascular:     Rate and Rhythm: Regular rhythm. Tachycardia present.     Heart sounds: Normal heart sounds. No murmur heard.  No friction rub.  Pulmonary:     Effort: Pulmonary effort is normal. No respiratory distress.     Breath sounds: Rales (bibasilar) present. No wheezing.  Abdominal:     General: There is no distension.     Palpations: Abdomen is soft.     Tenderness: There is no abdominal tenderness.  Musculoskeletal:     Cervical back: Neck supple.  Skin:    General: Skin is warm.       Capillary Refill: Capillary refill takes less than 2 seconds.  Neurological:     Mental Status: He is alert. He is disoriented.     Motor: No abnormal muscle tone.     Comments: Oriented to person, place, but not date. MAE. CN intact.       ____________________________________________   LABS (all labs ordered are listed, but only abnormal results are displayed)  Labs Reviewed  CBC WITH DIFFERENTIAL/PLATELET - Abnormal; Notable for the following components:  Result Value   Hemoglobin 12.4 (*)    HCT 38.1 (*)    Monocytes Absolute 1.2 (*)    All other components within normal limits  COMPREHENSIVE METABOLIC PANEL - Abnormal; Notable for the following components:   Glucose, Bld 107 (*)    BUN 27 (*)    Creatinine, Ser 2.21 (*)    Calcium 8.4 (*)    Albumin 3.3 (*)    GFR, Estimated 28 (*)    All other components within normal limits  FIBRIN DERIVATIVES D-DIMER ([facility] ONLY) - Abnormal; Notable for the following components:   Fibrin derivatives D-dimer ([facility]) 1,103.62 (*)    All other components within normal limits  C-REACTIVE PROTEIN - Abnormal; Notable for the following components:   CRP 11.9 (*)    All other components within normal limits  BRAIN NATRIURETIC PEPTIDE - Abnormal; Notable for the following components:   B Natriuretic Peptide 105.5 (*)    All other components within normal limits  TROPONIN I (HIGH SENSITIVITY) - Abnormal; Notable for the following components:   Troponin I (High Sensitivity) 18 (*)    All other components within normal limits  TROPONIN I (HIGH SENSITIVITY) - Abnormal; Notable for the following components:   Troponin I (High Sensitivity) 18 (*)    All other components within normal limits  CULTURE, BLOOD (SINGLE)  LACTIC ACID, PLASMA  PROCALCITONIN    ____________________________________________  EKG: Normal sinus rhythm, ventricular rate 63.  QRS 97, QTc 4 4.  No acute ST elevations or  depressions.  ________________________________________  RADIOLOGY All imaging, including plain films, CT scans, and ultrasounds, independently reviewed by me, and interpretations confirmed via formal radiology reads.  ED MD interpretation:   CXR: Multifocal PNA  Official radiology report(s): DG Chest Portable 1 View  Result Date: 04/03/2020 CLINICAL DATA:  COVID-19, hypoxia. EXAM: PORTABLE CHEST 1 VIEW COMPARISON:  03/14/2020 chest radiograph and prior. FINDINGS: No pneumothorax or pleural effusion. Hypoinflated lungs with patchy bibasilar opacities. Prominence of the cardiac silhouette, unchanged. No acute osseous abnormality. IMPRESSION: Patchy bibasilar opacities, atelectasis versus infiltrate. Electronically Signed   By: Stana Buntinghikanele  Emekauwa M.D.   On: 04/03/2020 08:07    ____________________________________________  PROCEDURES   Procedure(s) performed (including Critical Care):  .Critical Care Performed by: Shaune PollackIsaacs, Abriel Geesey, MD Authorized by: Shaune PollackIsaacs, Berlinda Farve, MD   Critical care provider statement:    Critical care time (minutes):  35   Critical care time was exclusive of:  Separately billable procedures and treating other patients and teaching time   Critical care was necessary to treat or prevent imminent or life-threatening deterioration of the following conditions:  Circulatory failure, respiratory failure and cardiac failure   Critical care was time spent personally by me on the following activities:  Development of treatment plan with patient or surrogate, discussions with consultants, evaluation of patient's response to treatment, examination of patient, obtaining history from patient or surrogate, ordering and performing treatments and interventions, ordering and review of laboratory studies, ordering and review of radiographic studies, pulse oximetry, re-evaluation of patient's condition and review of old charts   I assumed direction of critical care for this patient from  another provider in my specialty: no    .1-3 Lead EKG Interpretation Performed by: Shaune PollackIsaacs, Tonny Isensee, MD Authorized by: Shaune PollackIsaacs, Machell Wirthlin, MD     Interpretation: normal     ECG rate assessment: normal     Rhythm: sinus rhythm     Ectopy: none     Conduction: normal   Comments:  Indication: SOB    ____________________________________________  INITIAL IMPRESSION / MDM / ASSESSMENT AND PLAN / ED COURSE  As part of my medical decision making, I reviewed the following data within the electronic MEDICAL RECORD NUMBER Nursing notes reviewed and incorporated, Old chart reviewed, Notes from prior ED visits, and [place] Controlled Substance Database       *Paul Bryan was evaluated in Emergency Department on 04/03/2020 for the symptoms described in the history of present illness. He was evaluated in the context of the global COVID-19 pandemic, which necessitated consideration that the patient might be at risk for infection with the SARS-CoV-2 virus that causes COVID-19. Institutional protocols and algorithms that pertain to the evaluation of patients at risk for COVID-19 are in a state of rapid change based on information released by regulatory bodies including the CDC and federal and state organizations. These policies and algorithms were followed during the patient's care in the ED.  Some ED evaluations and interventions may be delayed as a result of limited staffing during the pandemic.*     Medical Decision Making:  86 yo M here with acute hypoxic resp failure 2/2 COVID-19. CXR shows multifocal PNA. Sats 87-88% on RA, improved on 2L [place]. Labs show likely mild AoCKD with BUN 27, CR 2.21. D-Dimer, inflammatory markers pending. CBC without significant leukocytosis. LA 1.1, which is reassuring. Low concern for bacterial superinfection clinically but procal is pending. Will give IV steroids, remdesevir, admit. Hospitalist aware. Pt maintained on tele, which shows NSR w/o ectopy or arrhythmia. EKG is  nonischemic.  Patient improving on supplemental O2.  Admitted for acute respiratory failure secondary to COVID-19.  ____________________________________________  FINAL CLINICAL IMPRESSION(S) / ED DIAGNOSES  Final diagnoses:  COVID-19  Acute respiratory failure with hypoxia (HCC)     MEDICATIONS GIVEN DURING THIS VISIT:  Medications  remdesivir 200 mg in sodium chloride 0.9% 250 mL IVPB (0 mg Intravenous Stopped 04/03/20 0918)    Followed by  remdesivir 100 mg in sodium chloride 0.9 % 100 mL IVPB (has no administration in time range)  aspirin EC tablet 81 mg (81 mg Oral Given 04/03/20 1006)  amLODipine (NORVASC) tablet 2.5 mg (2.5 mg Oral Given 04/03/20 1006)  simvastatin (ZOCOR) tablet 20 mg (has no administration in time range)  galantamine (RAZADYNE) tablet 8 mg (8 mg Oral Given 04/03/20 1005)  LORazepam (ATIVAN) tablet 0.5 mg (has no administration in time range)  QUEtiapine (SEROQUEL) tablet 25 mg (has no administration in time range)  traZODone (DESYREL) tablet 50 mg (has no administration in time range)  senna (SENOKOT) tablet 8.6 mg (has no administration in time range)  tamsulosin (FLOMAX) capsule 0.4 mg (0.4 mg Oral Given 04/03/20 1006)  melatonin tablet 5 mg (has no administration in time range)  Vitamin D (Ergocalciferol) (DRISDOL) capsule 50,000 Units (50,000 Units Oral Given 04/03/20 1004)  sodium chloride flush (NS) 0.9 % injection 3 mL (3 mLs Intravenous Given 04/03/20 1007)  sodium chloride flush (NS) 0.9 % injection 3 mL (has no administration in time range)  0.9 %  sodium chloride infusion (has no administration in time range)  albuterol (VENTOLIN HFA) 108 (90 Base) MCG/ACT inhaler 2 puff (2 puffs Inhalation Given 04/03/20 0918)  guaiFENesin-dextromethorphan (ROBITUSSIN DM) 100-10 MG/5ML syrup 10 mL (has no administration in time range)  ascorbic acid (VITAMIN C) tablet 500 mg (500 mg Oral Given 04/03/20 1003)  zinc sulfate capsule 220 mg (220 mg Oral Given  04/03/20 1003)  acetaminophen (TYLENOL) tablet 650 mg (has no administration in  time range)  ondansetron (ZOFRAN) tablet 4 mg (has no administration in time range)    Or  ondansetron (ZOFRAN) injection 4 mg (has no administration in time range)  dexamethasone (DECADRON) injection 6 mg (has no administration in time range)  enoxaparin (LOVENOX) injection 30 mg (has no administration in time range)  famotidine (PEPCID) tablet 20 mg (has no administration in time range)  methylPREDNISolone sodium succinate (SOLU-MEDROL) 125 mg/2 mL injection 80 mg (80 mg Intravenous Given 04/03/20 0800)     ED Discharge Orders    None       Note:  This document was prepared using Dragon voice recognition software and may include unintentional dictation errors.   Shaune Pollack, MD 04/03/20 1401    Shaune Pollack, MD 04/03/20 [phone]

## 2020-04-03 NOTE — Consult Note (Signed)
Remdesivir - Pharmacy Brief Note    O:  CXR: pending SpO2: 95% on 2L [place]   A/P:  Remdesivir 200 mg IVPB once followed by 100 mg IVPB daily x 4 days.   Raiford Noble, PharmD Pharmacy Resident  04/03/2020 7:30 AM

## 2020-04-03 NOTE — ED Notes (Signed)
Pt given meal tray and assisted with meal.

## 2020-04-03 NOTE — ED Notes (Signed)
Pt assisted to bathroom with one person standby assist, steady gait noted.

## 2020-04-03 NOTE — H&P (Addendum)
History and Physical    Paul Bryan ATF:[id number] DOB: Nov 25, 1929 DOA: 04/03/2020  PCP: Kandyce Rud, MD   Patient coming from: [facility]  I have personally briefly reviewed patient's old medical records in [facility]  Chief Complaint: Weakness Most of the history was obtained from patient's son over the phone  HPI: Paul Bryan is a 86 y.o. male with medical history significant for dementia and hypertension who was brought into the emergency room from the skilled nursing facility where he resides for evaluation of weakness.  Patient tested positive for the COVID-19 virus on 11/23.  He is unvaccinated.  He has a nonproductive cough and per the nursing home staff has had shortness of breath.  On the day of admission he was found on the floor by the nursing home staff and was too weak to get up.  EMS was called and when they arrived he was noted to have room air pulse oximetry of 88 to 89% and this improved to the high 90s on 2 L. I am unable to do a review of systems on this patient due to his underlying dementia. Labs show sodium 135, potassium 3.7, chloride 99, bicarb 24, glucose 107, BUN 27, creatinine 2.21 above the baseline of 1.74, calcium 8.4, alkaline phosphatase 48, albumin 3.3, AST 36, ALT 25, total protein 6.9, troponin XVIII, lactic acid 1.1, white count 6.4, hemoglobin 12.4, hematocrit 38.1, MCV 82.6, RDW 15.5, platelet count 249 Chest x-ray reviewed by me shows patchy bibasilar opacities.   ED Course: Patient is an unvaccinated 86 year old male who resides in a skilled nursing facility and was brought into the emergency room for evaluation of weakness.  Patient tested positive for the COVID-19 virus on 11/23.  Chest x-ray shows bibasilar opacities.  Patient was hypoxic in the field with room air pulse oximetry of 88 to 89% which improved following oxygen supplementation at 2 L.  He received remdesivir and Solu-Medrol in the ER and will be admitted to the hospital  for further evaluation.  Review of Systems: As per HPI otherwise 10 point review of systems negative.    Past Medical History:  Diagnosis Date  . Hypertension   . Restless leg syndrome     Past Surgical History:  Procedure Laterality Date  . ABLATION    . APPENDECTOMY    . COLONOSCOPY N/A 10/10/2017   Procedure: COLONOSCOPY;  Surgeon: Wyline Mood, MD;  Location: [facility] ENDOSCOPY;  Service: Gastroenterology;  Laterality: N/A;  . ESOPHAGOGASTRODUODENOSCOPY N/A 10/10/2017   Procedure: ESOPHAGOGASTRODUODENOSCOPY (EGD);  Surgeon: Wyline Mood, MD;  Location: [facility] ENDOSCOPY;  Service: Gastroenterology;  Laterality: N/A;  . FISSURECTOMY       reports that he has quit smoking. He has never used smokeless tobacco. No history on file for alcohol use and drug use.  No Known Allergies  Family History  Family history unknown: Yes     Prior to Admission medications   Medication Sig Start Date End Date Taking? Authorizing Provider  acetaminophen (TYLENOL) 500 MG tablet Take 500 mg by mouth 3 (three) times daily.    [provider]  amLODipine (NORVASC) 2.5 MG tablet Take 1 tablet (2.5 mg total) by mouth daily. 02/08/19   Alford Highland, MD  aspirin EC 81 MG tablet Take 81 mg by mouth daily.    [provider]  cefdinir (OMNICEF) 300 MG capsule Take 1 capsule (300 mg total) by mouth 2 (two) times daily. 02/08/19   Alford Highland, MD  famotidine (PEPCID) 40 MG  tablet Take 40 mg by mouth daily.    [provider]  furosemide (LASIX) 20 MG tablet Take 20 mg by mouth daily. 07/19/17   [provider]  galantamine (RAZADYNE) 8 MG tablet Take 8 mg by mouth daily.  09/08/17   [provider]  KLOR-CON M10 10 MEQ tablet Take 10 mEq by mouth 2 (two) times daily.  09/08/17   [provider]  LORazepam (ATIVAN) 0.5 MG tablet Take 1 tablet (0.5 mg total) by mouth 2 (two) times daily as needed for anxiety. 02/08/19   Alford Highland, MD  Melatonin 5 MG TABS  Take 1 tablet by mouth at bedtime.    [provider]  pantoprazole (PROTONIX) 40 MG tablet Take 40 mg by mouth daily. 08/25/17   [provider]  QUEtiapine (SEROQUEL) 25 MG tablet Take 1 tablet (25 mg total) by mouth at bedtime as needed (insomnia or agitation). 02/08/19   Alford Highland, MD  senna (SENOKOT) 8.6 MG TABS tablet Take 1 tablet by mouth daily.    [provider]  simvastatin (ZOCOR) 20 MG tablet Take 20 mg by mouth at bedtime.  09/14/17   [provider]  tamsulosin (FLOMAX) 0.4 MG CAPS capsule Take 0.4 mg by mouth daily. 09/12/17   [provider]  traMADol (ULTRAM) 50 MG tablet Take 1 tablet (50 mg total) by mouth every 6 (six) hours as needed. 02/08/19   Alford Highland, MD  traZODone (DESYREL) 50 MG tablet Take 50 mg by mouth at bedtime.    [provider]  VITAMIN D, ERGOCALCIFEROL, PO Take 5,000 Units by mouth daily.    [provider]    Physical Exam: Vitals:   04/03/20 0714 04/03/20 0722 04/03/20 0730 04/03/20 0745  BP: 138/69  135/68   Pulse: 69  82 77  Resp: (!) 3  (!) 21 (!) 24  Temp: 99.6 F (37.6 C)     TempSrc: Oral     SpO2: 90% 95% 98% 95%  Weight:  72.6 kg    Height:  5\' 8"  (1.727 m)       Vitals:   04/03/20 0714 04/03/20 0722 04/03/20 0730 04/03/20 0745  BP: 138/69  135/68   Pulse: 69  82 77  Resp: (!) 3  (!) 21 (!) 24  Temp: 99.6 F (37.6 C)     TempSrc: Oral     SpO2: 90% 95% 98% 95%  Weight:  72.6 kg    Height:  5\' 8"  (1.727 m)      Constitutional: NAD, alert and oriented to person Eyes: PERRL, lids and conjunctivae normal ENMT: Mucous membranes are moist.  Neck: normal, supple, no masses, no thyromegaly Respiratory: Bilateral air entry, no wheezing, no crackles. Normal respiratory effort. No accessory muscle use.  Cardiovascular: Regular rate and rhythm, no murmurs / rubs / gallops. No extremity edema. 2+ pedal pulses. No carotid bruits.  Abdomen: no tenderness, no masses  palpated. No hepatosplenomegaly. Bowel sounds positive.  Musculoskeletal: no clubbing / cyanosis. No joint deformity upper and lower extremities.  Skin: no rashes, lesions, ulcers.  Neurologic: No gross focal neurologic deficit.  Generalized weakness Psychiatric: Normal mood and affect.   Labs on Admission: I have personally reviewed following labs and imaging studies  CBC: Recent Labs  Lab 04/03/20 0726  WBC 6.4  NEUTROABS 4.1  HGB 12.4*  HCT 38.1*  MCV 82.6  PLT 249   Basic Metabolic Panel: Recent Labs  Lab 04/03/20 0726  NA 135  K 3.7  CL 99  CO2 24  GLUCOSE 107*  BUN 27*  CREATININE 2.21*  CALCIUM 8.4*   GFR: Estimated Creatinine Clearance: 22.4 mL/min (A) (by C-G formula based on SCr of 2.21 mg/dL (H)). Liver Function Tests: Recent Labs  Lab 04/03/20 0726  AST 36  ALT 25  ALKPHOS 48  BILITOT 0.6  PROT 6.9  ALBUMIN 3.3*   No results for input(s): LIPASE, AMYLASE in the last 168 hours. No results for input(s): AMMONIA in the last 168 hours. Coagulation Profile: No results for input(s): INR, PROTIME in the last 168 hours. Cardiac Enzymes: No results for input(s): CKTOTAL, CKMB, CKMBINDEX, TROPONINI in the last 168 hours. BNP (last 3 results) No results for input(s): PROBNP in the last 8760 hours. HbA1C: No results for input(s): HGBA1C in the last 72 hours. CBG: No results for input(s): GLUCAP in the last 168 hours. Lipid Profile: No results for input(s): CHOL, HDL, LDLCALC, TRIG, CHOLHDL, LDLDIRECT in the last 72 hours. Thyroid Function Tests: No results for input(s): TSH, T4TOTAL, FREET4, T3FREE, THYROIDAB in the last 72 hours. Anemia Panel: No results for input(s): VITAMINB12, FOLATE, FERRITIN, TIBC, IRON, RETICCTPCT in the last 72 hours. Urine analysis:    Component Value Date/Time   COLORURINE YELLOW (A) 02/04/2019 2340   APPEARANCEUR CLEAR (A) 02/04/2019 2340   LABSPEC 1.012 02/04/2019 2340   PHURINE 5.0 02/04/2019 2340   GLUCOSEU NEGATIVE  02/04/2019 2340   HGBUR NEGATIVE 02/04/2019 2340   BILIRUBINUR NEGATIVE 02/04/2019 2340   KETONESUR NEGATIVE 02/04/2019 2340   PROTEINUR 100 (A) 02/04/2019 2340   NITRITE NEGATIVE 02/04/2019 2340   LEUKOCYTESUR NEGATIVE 02/04/2019 2340    Radiological Exams on Admission: DG Chest Portable 1 View  Result Date: 04/03/2020 CLINICAL DATA:  COVID-19, hypoxia. EXAM: PORTABLE CHEST 1 VIEW COMPARISON:  03/14/2020 chest radiograph and prior. FINDINGS: No pneumothorax or pleural effusion. Hypoinflated lungs with patchy bibasilar opacities. Prominence of the cardiac silhouette, unchanged. No acute osseous abnormality. IMPRESSION: Patchy bibasilar opacities, atelectasis versus infiltrate. Electronically Signed   By: Stana Bunting M.D.   On: 04/03/2020 08:07    EKG: Independently reviewed.   Assessment/Plan Principal Problem:   Pneumonia due to COVID-19 virus Active Problems:   Hypertension   CKD (chronic kidney disease), stage III (HCC)   Dehydration   BPH (benign prostatic hyperplasia)   Acute respiratory failure (HCC)     Pneumonia due to COVID-19 virus with acute Respiratory Failure  Patient is unvaccinated and resides in a skilled facility He tested positive for the COVID-19 virus on 11/23 On admission patient was noted to be hypoxic with room air pulse oximetry of 88% that improved following oxygen supplementation at 2 L to the upper 90s Chest x-ray shows bibasilar opacities Place patient on contact and droplet isolation Continue remdesivir per protocol Place patient on Decadron 6 mg IV daily Supportive care with antitussives and bronchodilator therapy   Dementia Continue galantamine, as needed Ativan, trazodone and Seroquel   Hypertension Continue amlodipine   Acute worsening of stage III chronic kidney disease At baseline patient has a serum creatinine of 1.74 and today on admission it is 2.21.  Worsening renal function appears to be secondary to dehydration and  concomitant diuretic use Hold furosemide for now Monitor renal function closely Avoid nephrotoxic agents   BPH Continue Flomax       DVT prophylaxis: Lovenox Code Status: Full code Family Communication: Greater than 50% of time was spent discussing patient's condition and plan of care with his son Paul Bryan over  the phone.  All questions and concerns have been addressed.  He verbalizes understanding and agrees with the plan.  CODE STATUS was discussed and patient's son after reviewing his living will states he is a DO NOT RESUSCITATE Disposition Plan: Back to previous home environment Consults called: None    Brighten Orndoff MD [facility]     04/03/2020, 8:36 AM

## 2020-04-03 NOTE — ED Triage Notes (Signed)
Pt to ED via [facility] from [facility] for weakness, was unable to get OOB this morning. Had positive covid test 2d ago on 11/23. CBG per EMS was 125, HR varied and they said pt may be in a fib. Pt was 91-92% on RA and came up to 96% on 2L oxygen. T was 99.7 oral. Pt has hx dementia. Pt currently alert and oriented tp self and place. EDP in room talking with pt.

## 2020-04-03 NOTE — ED Notes (Signed)
Patient found in room out of bed and in bathroom. Patient had taken all his cords off. Patient directed back to bed and hooked back up to monitor. Bed alarm in place. Patient disoriented to time, place, and situation

## 2020-04-04 DIAGNOSIS — N400 Enlarged prostate without lower urinary tract symptoms: Secondary | ICD-10-CM | POA: Diagnosis not present

## 2020-04-04 DIAGNOSIS — J9600 Acute respiratory failure, unspecified whether with hypoxia or hypercapnia: Secondary | ICD-10-CM | POA: Diagnosis not present

## 2020-04-04 DIAGNOSIS — E860 Dehydration: Secondary | ICD-10-CM

## 2020-04-04 DIAGNOSIS — N183 Chronic kidney disease, stage 3 unspecified: Secondary | ICD-10-CM | POA: Diagnosis not present

## 2020-04-04 DIAGNOSIS — U071 COVID-19: Secondary | ICD-10-CM | POA: Diagnosis not present

## 2020-04-04 DIAGNOSIS — I10 Essential (primary) hypertension: Secondary | ICD-10-CM

## 2020-04-04 LAB — CBC WITH DIFFERENTIAL/PLATELET
Abs Immature Granulocytes: 0.06 10*3/uL (ref 0.00–0.07)
Basophils Absolute: 0 10*3/uL (ref 0.0–0.1)
Basophils Relative: 0 %
Eosinophils Absolute: 0 10*3/uL (ref 0.0–0.5)
Eosinophils Relative: 0 %
HCT: 37.5 % — ABNORMAL LOW (ref 39.0–52.0)
Hemoglobin: 12.8 g/dL — ABNORMAL LOW (ref 13.0–17.0)
Immature Granulocytes: 1 %
Lymphocytes Relative: 13 %
Lymphs Abs: 0.9 10*3/uL (ref 0.7–4.0)
MCH: 27.2 pg (ref 26.0–34.0)
MCHC: 34.1 g/dL (ref 30.0–36.0)
MCV: 79.6 fL — ABNORMAL LOW (ref 80.0–100.0)
Monocytes Absolute: 0.8 10*3/uL (ref 0.1–1.0)
Monocytes Relative: 12 %
Neutro Abs: 5.2 10*3/uL (ref 1.7–7.7)
Neutrophils Relative %: 74 %
Platelets: 274 10*3/uL (ref 150–400)
RBC: 4.71 MIL/uL (ref 4.22–5.81)
RDW: 15.2 % (ref 11.5–15.5)
WBC: 6.9 10*3/uL (ref 4.0–10.5)
nRBC: 0 % (ref 0.0–0.2)

## 2020-04-04 LAB — COMPREHENSIVE METABOLIC PANEL
ALT: 31 U/L (ref 0–44)
AST: 56 U/L — ABNORMAL HIGH (ref 15–41)
Albumin: 3.1 g/dL — ABNORMAL LOW (ref 3.5–5.0)
Alkaline Phosphatase: 49 U/L (ref 38–126)
Anion gap: 15 (ref 5–15)
BUN: 31 mg/dL — ABNORMAL HIGH (ref 8–23)
CO2: 20 mmol/L — ABNORMAL LOW (ref 22–32)
Calcium: 8.3 mg/dL — ABNORMAL LOW (ref 8.9–10.3)
Chloride: 103 mmol/L (ref 98–111)
Creatinine, Ser: 1.73 mg/dL — ABNORMAL HIGH (ref 0.61–1.24)
GFR, Estimated: 38 mL/min — ABNORMAL LOW (ref >60)
Glucose, Bld: 123 mg/dL — ABNORMAL HIGH (ref 70–99)
Potassium: 3.6 mmol/L (ref 3.5–5.1)
Sodium: 138 mmol/L (ref 135–145)
Total Bilirubin: 0.3 mg/dL (ref 0.3–1.2)
Total Protein: 6.7 g/dL (ref 6.5–8.1)

## 2020-04-04 LAB — FIBRIN DERIVATIVES D-DIMER (ARMC ONLY): Fibrin derivatives D-dimer (ARMC): 1176.1 ng/mL (FEU) — ABNORMAL HIGH (ref 0.00–499.00)

## 2020-04-04 LAB — PHOSPHORUS: Phosphorus: 4 mg/dL (ref 2.5–4.6)

## 2020-04-04 LAB — C-REACTIVE PROTEIN: CRP: 12.3 mg/dL — ABNORMAL HIGH (ref <1.0)

## 2020-04-04 LAB — MAGNESIUM: Magnesium: 2.1 mg/dL (ref 1.7–2.4)

## 2020-04-04 LAB — FERRITIN: Ferritin: 86 ng/mL (ref 24–336)

## 2020-04-04 MED ORDER — HALOPERIDOL LACTATE 5 MG/ML IJ SOLN
5.0000 mg | Freq: Once | INTRAMUSCULAR | Status: AC
  Administered 2020-04-04: 5 mg via INTRAMUSCULAR
  Filled 2020-04-04: qty 1

## 2020-04-04 NOTE — ED Notes (Signed)
This RN at bedside. Pt at end of bed. This RN trying to redirect pt. Pt refusing to get back in bed at this time. Pt remains at end of bed. Pt wondering in room despite redirection from this RN. Pt stating, "I cant walk to the damn kitchen." This RN redirecting pt that he is in the ER and this RN will bring him food if he is hungry.This RN stressing the importance that is pt continues to try and get out of bed he may fall which will extend his hospital stay and can result in further injuries. Pt stating,"I'm not going to fall I can walk just fine." Tele sitter activated in room.

## 2020-04-04 NOTE — ED Notes (Signed)
This RN at bedside. Pt bed alarm and telesitter alarm going off. Pt out of bed walking around room holding a pen. This RN trying to redirect and instruct pt back to bed. Pt stating, "I need to check my mail." This RN reoriented pt to situation. Pt walking to the bathroom. This RN unable to get pt back in bed, pt in bathroom on toilet.

## 2020-04-04 NOTE — ED Notes (Signed)
This RN at bedside. Pt provided meal tray at this time. Pt remains in bed with bed alarm and telesitter on.

## 2020-04-04 NOTE — Progress Notes (Addendum)
PROGRESS NOTE  Paul Bryan OFB:[id number] DOB: [birth year] DOA: 04/03/2020 PCP: Kandyce Rud, MD   LOS: 1 day   Brief narrative: As per HPI,  Paul Bryan is a 86 y.o. male  with past medical history of dementia and hypertension was brought into the hospital from skilled nursing facility for evaluation of weakness.  Of note, patient tested positive for the COVID-19 virus on 11/23.  He is unvaccinated.    He was reported to have nonproductive cough with shortness of breath was found on the EMS was called in when he was noted to have a pulse ox of 8889% on room air.  Patient was given supplemental oxygen with improvement to the high 90s on 2 L.  Initial laboratory data was notable for creatinine of 2.2 above baseline of 1.7.  Lactate was within normal limits.  WBC within normal limits.  Chest x-ray showed bibasilar opacities.  In the ED, patient received remdesivir and Solu-Medrol and was considered for admission to the hospital.  Assessment/Plan:  Principal Problem:   Pneumonia due to COVID-19 virus Active Problems:   Hypertension   CKD (chronic kidney disease), stage III (HCC)   Dehydration   BPH (benign prostatic hyperplasia)   Acute respiratory failure (HCC)   Pneumonia due to COVID-19 virus with acute hypoxic respiratory Failure  Patient tested positive for Covid virus on 11/23.  Unvaccinated at the skilled nursing facility.  Continue supplemental oxygen.  Continue remdesivir dexamethasone supportive care with antitussives and bronchodilators.  Currently on 2 L of oxygen by nasal cannula.  Monitor inflammatory markers.  COVID-19 Labs  Recent Labs    04/03/20 0726 04/04/20 0544  FERRITIN  --  86  CRP 11.9*  --     Lab Results  Component Value Date   SARSCOV2NAA NEGATIVE 02/07/2019   SARSCOV2NAA NEGATIVE 02/04/2019    Dementia Continue galantamine, as needed Ativan, trazodone and Seroquel.  Patient with episodes of agitation and confusion in the ED.  Will give 1  dose of IM Haldol for sedation since he is at a very high risk of falls and injury.  Will benefit from one-to-one sitter at bedside but is not available in the ED yet.  Hypertension Continue amlodipine, improved.  Acute kidney injury on stage IIIa chronic kidney disease Likely secondary to volume depletion and diuretics.  Creatinine today is 1.7.  Hold Lasix.  At baseline patient has a serum creatinine of 1.74 and  on admission it was 2.21.   BPH Continue Flomax p.o.   DVT prophylaxis: enoxaparin (LOVENOX) injection 30 mg Start: 04/03/20 2200   Code Status:  DNR  Family Communication:  I spoke with the patient's son Mr. Edwyna Ready on the phone and updated him about the clinical condition of the patient.  Informed about agitation confusion and possible need for sedation in the hospital.  He has acknowledged this.  Status is: Inpatient  Remains inpatient appropriate because:IV treatments appropriate due to intensity of illness or inability to take PO, Inpatient level of care appropriate due to severity of illness and Covid pneumonia with hypoxic respiratory failure   Dispo: The patient is from: SNF              Anticipated d/c is to: SNF              Anticipated d/c date is: 2 days              Patient currently is not medically stable to d/c.  Consultants:  None  Procedures:  None  Antibiotics:  . Remdesivir 11/25>  Subjective: Today, patient was seen and examined at bedside.  Nursing staff reported that the patient has been very confused and agitated trying to get out of the bed all the time.  Geographical information systems officer in place . Objective: Vitals:   04/04/20 0630 04/04/20 0800  BP: (!) 153/72 139/81  Pulse: 72 88  Resp: (!) 22 (!) 21  Temp:    SpO2: 97% 96%    Intake/Output Summary (Last 24 hours) at 04/04/2020 0847 Last data filed at 04/04/2020 0755 Gross per 24 hour  Intake --  Output 500 ml  Net -500 ml   Filed Weights   04/03/20 0722  Weight: 72.6 kg    Body mass index is 24.33 kg/m.   Physical Exam:  GENERAL: Patient is alert awake and communicative but confused disoriented and agitated at times.  On 2 L of oxygen by nasal cannula. HENT: No scleral pallor or icterus. Pupils equally reactive to light. Oral mucosa is moist NECK: is supple, no gross swelling noted. CHEST: Decreased breath sounds bilaterally. CVS: S1 and S2 heard, no murmur. Regular rate and rhythm.  ABDOMEN: Soft, non-tender, bowel sounds are present. EXTREMITIES: No edema. CNS: Cranial nerves are intact.  Moving all extremities.  Confused disoriented at times, SKIN: warm and dry without rashes.  Data Review: I have personally reviewed the following laboratory data and studies,  CBC: Recent Labs  Lab 04/03/20 0726 04/04/20 0544  WBC 6.4 6.9  NEUTROABS 4.1 5.2  HGB 12.4* 12.8*  HCT 38.1* 37.5*  MCV 82.6 79.6*  PLT 249 274   Basic Metabolic Panel: Recent Labs  Lab 04/03/20 0726 04/04/20 0544  NA 135 138  K 3.7 3.6  CL 99 103  CO2 24 20*  GLUCOSE 107* 123*  BUN 27* 31*  CREATININE 2.21* 1.73*  CALCIUM 8.4* 8.3*  MG  --  2.1  PHOS  --  4.0   Liver Function Tests: Recent Labs  Lab 04/03/20 0726 04/04/20 0544  AST 36 56*  ALT 25 31  ALKPHOS 48 49  BILITOT 0.6 0.3  PROT 6.9 6.7  ALBUMIN 3.3* 3.1*   No results for input(s): LIPASE, AMYLASE in the last 168 hours. No results for input(s): AMMONIA in the last 168 hours. Cardiac Enzymes: No results for input(s): CKTOTAL, CKMB, CKMBINDEX, TROPONINI in the last 168 hours. BNP (last 3 results) Recent Labs    03/14/20 1929 04/03/20 0726  BNP 43.8 105.5*    ProBNP (last 3 results) No results for input(s): PROBNP in the last 8760 hours.  CBG: No results for input(s): GLUCAP in the last 168 hours. Recent Results (from the past 240 hour(s))  Blood culture (single)     Status: None (Preliminary result)   Collection Time: 04/03/20  7:27 AM   Specimen: BLOOD  Result Value Ref Range Status    Specimen Description BLOOD RIGHT [facility]  Final   Special Requests   Final    BOTTLES DRAWN AEROBIC AND ANAEROBIC Blood Culture results may not be optimal due to an excessive volume of blood received in culture bottles   Culture   Final    NO GROWTH < 24 HOURS Performed at [facility], [address] [place], [place] [address]    Report Status PENDING  Incomplete     Studies: DG Chest Portable 1 View  Result Date: 04/03/2020 CLINICAL DATA:  COVID-19, hypoxia. EXAM: PORTABLE CHEST 1 VIEW COMPARISON:  03/14/2020 chest radiograph and prior. FINDINGS:  No pneumothorax or pleural effusion. Hypoinflated lungs with patchy bibasilar opacities. Prominence of the cardiac silhouette, unchanged. No acute osseous abnormality. IMPRESSION: Patchy bibasilar opacities, atelectasis versus infiltrate. Electronically Signed   By: Stana Bunting M.D.   On: 04/03/2020 08:07      Joycelyn Das, MD  [facility] 04/04/2020

## 2020-04-04 NOTE — ED Notes (Signed)
This RN at bedside. Pt bed alarm going off and telesitter alarm going off. Pt at end of bed. Pt refusing to get back in bed at this time. Pt wondering around room, washing his hands and picking up items off the floor. Pt requesting personal mail. This RN trying to redirect pt back to bed and orient him to being in the hospital. Pt eventually cooperative and assisted back in bed. Pt pulled out IV, IV laying on bedside table. Pt refusing to put on yellow non-skid socks. Monitoring cords remain off of pt. Bed alarm activated and telesitter present.

## 2020-04-04 NOTE — ED Notes (Signed)
1:1 sitter at bedside now

## 2020-04-04 NOTE — ED Notes (Signed)
Pt assisted back in bed by this RN. Bed placed back in trendelenburg position, low and locked with both side rails up. Bed alarm in place. Tele sitter remains at end of bed.

## 2020-04-04 NOTE — ED Notes (Signed)
This RN at bedside. Pt bed alarm going off. Pt at end of bed trying to stand up. This RN reorienting pt that he is in the hospital and needs to remain in bed for safety. This RN explaining that if pt needs something to hit the call bell so he can be assisted. Pt stating, "I can get out of bed, I can walk." This RN explaining the fall risk of pt doing so and stressing the importance of pt remaining on the monitors. Pt initially refusing to get back in bed but now cooperative and assisted back in bed. Monitoring cords are off at this time. Bed placed back in trendelenburg position, low and locked with both side rails up. Bed alarm in place. Order for tele sitter placed

## 2020-04-04 NOTE — ED Notes (Addendum)
This RN at bedside. Pt bed alarm going off. Pt attempting to exit over left side rail. This RN assisted pt back in center of bed. Monitors remains in place. Bedplaced backin trendelenburg position, low and locked with both side rails up. Bed alarm remains in place. Lights dimmed to promote rest.

## 2020-04-04 NOTE — ED Notes (Addendum)
This RN at bedside. Pt bed alarm going off. Pt at end of bed trying to ambulate to bathroom. This RN redirected pt to use the urinal at bedside. Pt cooperative. This RN and Emiliano Dyer assisted pt back in bed. Pt reconnected to monitors.

## 2020-04-04 NOTE — ED Notes (Addendum)
This RN at bedside. Pt bed alarm going off. Pt attempting to exit bed over left side rail. This RN reorienting pt at this time. Monitoring cords placed back on pt. Bed remains in trendelenburg position, low and locked with both side rails up. Bed alarm remains in place.

## 2020-04-04 NOTE — ED Notes (Signed)
Pt bed alarm going off and pt attempting to get out of bed. Other staff RN in room due to this RN being in another room at this time.

## 2020-04-04 NOTE — ED Notes (Addendum)
This RN at bedside. Pt bed alarm going off. Pt scooted to end of bed trying to get out. This RN assisted pt back in bed. Monitors remains in place. Bed placed back in trendelenburg position, low and locked with both side rails up. Bed alarm remains in place.

## 2020-04-04 NOTE — ED Notes (Addendum)
This RN and Marisue Ivan, RN at bedside. Pt bed alarm going off. Telesitter unable to redirect pt and telesitter alarm going off. Pt at end of bed. Pt pulled off yellow non-skid socks and stated he doesn't like them. Pt stating, "I'm trying to see my son and [name] is outside the door I can see him." This RN attempting to redirect pt. Pt assisted back in bed by both RNs. Bed placed back in trendelenburg position, low and locked with both side rails up. Bed alarm in place. Tele sitter remains at end of bed.

## 2020-04-04 NOTE — ED Notes (Signed)
This RN at bedside. Pt out of bed. Bed alarm going off. This RN redirected pt back in bed. Waiting for 1-1 sitter.

## 2020-04-04 NOTE — ED Notes (Signed)
This RN at bedside. Pt bed alarm going off. Pt redirected by this RN to remain in bed for safety. Pt monitors off.

## 2020-04-04 NOTE — ED Notes (Signed)
This RN at bedside. Pt bed alarm going off and monitors pulled off. Ptattempting to exit from end of bed. This RN assisted pt backin center of bed. Monitors replaced. Bedremainsin trendelenburg position, low and locked with both side rails up. Bed alarm remains in place.

## 2020-04-04 NOTE — ED Notes (Signed)
Patient's bed alarm was going off. RN went to room and found patient attempting to slide out the end of the bed. Patient was placed back into bed and positioned for comfort. Reverse trendelenburg initiated with the foot of the bed elevated to facilitate bending of knees. Non-skid socks applied to patient. Cardiac monitor had been removed by patient as well as oxygen. Both re-applied. Bed locked and both side rails raised. Patient instructed on use of call bell should needs arise and educated regarding importance of staying in bed, on monitor, and with oxygen in place. Patient provided with blanket as well. Bed alarm remains in use.

## 2020-04-04 NOTE — ED Notes (Signed)
Pt continuing to remove BP cuff, pulse oximetry, and cardiac monitor every time they are replaced.

## 2020-04-04 NOTE — ED Notes (Addendum)
Report attempted x1. Floor staff stating they are unable to take patient. Charge RN made aware.

## 2020-04-04 NOTE — ED Notes (Signed)
Admitting MD at bedside.

## 2020-04-04 NOTE — ED Notes (Signed)
Sitter given pt meal tray

## 2020-04-04 NOTE — ED Notes (Addendum)
This RN at bedside. Bed alarm going off. Pt at end of bed attempting to get out. This RN trying to redirect pt without success. This RN attempting numerous times to redirect, decrease stimulation and create safe environment for pt without success. Pt still refuses to wear yellow non-skid socks. Pt remains without IV access. Pt agreeable to get back in bed at this time. Will reconnect pt to monitor when pt is more cooperative and sitter is present. Still waiting for 1-1 sitter.

## 2020-04-04 NOTE — ED Notes (Addendum)
This RN at bedside. Pt's monitoring cords wrapped around side rail. This RN reorienting pt to situation. Pt calm and cooperative. Bed in trendelenburg position, low and locked with both side rails up. Bed alarm remains in place.

## 2020-04-04 NOTE — Progress Notes (Signed)
Pt is unable to answer coherently to give admission criteria information. Pt has removed his IV, telemetry monitors and oxygen. Pt is not easily reoriented.

## 2020-04-04 NOTE — ED Notes (Addendum)
Paul Doom, RN at bedside due to this RN transporting pt. Pt attempting to get out of bed. Pt redirected back to bed at this time. Yellow fall band and yellow socks placed back on pt at this time. Monitors remain off of pt.

## 2020-04-04 NOTE — ED Notes (Signed)
This RN received called from telesitter that they are no longer monitoring pt due to him not being redirectable. Charge RN aware.

## 2020-04-04 NOTE — ED Notes (Signed)
Patient's bed alarm ringing. RN went to pt room and assisted pt back into bed. Cardiac monitor in place and call bell in reach. Bed low and locked with both side rails raised. Reverse trendelenburg remains in place as well as bed alarm. Patient provided with TV remote per request.

## 2020-04-04 NOTE — ED Notes (Addendum)
Pt out of bed. Secretary at bedside to assist pt back in bed due to this RN being in another room.

## 2020-04-04 NOTE — ED Notes (Signed)
This RN at bedside. Pt refusing PO medications at this time. Pt stating, "I don't need to be here."

## 2020-04-04 NOTE — ED Notes (Addendum)
This RN at bedside to provide pt apple juice and graham crackers until breakfast gets here. Pt remains on monitor at this time. Head of the bed raised to allow pt to eat and drink without risk of aspiration.

## 2020-04-05 DIAGNOSIS — G9341 Metabolic encephalopathy: Secondary | ICD-10-CM

## 2020-04-05 DIAGNOSIS — U071 COVID-19: Secondary | ICD-10-CM | POA: Diagnosis not present

## 2020-04-05 DIAGNOSIS — J9600 Acute respiratory failure, unspecified whether with hypoxia or hypercapnia: Secondary | ICD-10-CM | POA: Diagnosis not present

## 2020-04-05 DIAGNOSIS — N183 Chronic kidney disease, stage 3 unspecified: Secondary | ICD-10-CM | POA: Diagnosis not present

## 2020-04-05 DIAGNOSIS — N400 Enlarged prostate without lower urinary tract symptoms: Secondary | ICD-10-CM | POA: Diagnosis not present

## 2020-04-05 LAB — FERRITIN: Ferritin: 93 ng/mL (ref 24–336)

## 2020-04-05 LAB — COMPREHENSIVE METABOLIC PANEL
ALT: 37 U/L (ref 0–44)
AST: 73 U/L — ABNORMAL HIGH (ref 15–41)
Albumin: 3.2 g/dL — ABNORMAL LOW (ref 3.5–5.0)
Alkaline Phosphatase: 52 U/L (ref 38–126)
Anion gap: 12 (ref 5–15)
BUN: 36 mg/dL — ABNORMAL HIGH (ref 8–23)
CO2: 23 mmol/L (ref 22–32)
Calcium: 8.8 mg/dL — ABNORMAL LOW (ref 8.9–10.3)
Chloride: 104 mmol/L (ref 98–111)
Creatinine, Ser: 1.7 mg/dL — ABNORMAL HIGH (ref 0.61–1.24)
GFR, Estimated: 38 mL/min — ABNORMAL LOW (ref >60)
Glucose, Bld: 84 mg/dL (ref 70–99)
Potassium: 3.8 mmol/L (ref 3.5–5.1)
Sodium: 139 mmol/L (ref 135–145)
Total Bilirubin: 0.6 mg/dL (ref 0.3–1.2)
Total Protein: 6.6 g/dL (ref 6.5–8.1)

## 2020-04-05 LAB — CBC WITH DIFFERENTIAL/PLATELET
Abs Immature Granulocytes: 0.11 10*3/uL — ABNORMAL HIGH (ref 0.00–0.07)
Basophils Absolute: 0 10*3/uL (ref 0.0–0.1)
Basophils Relative: 0 %
Eosinophils Absolute: 0 10*3/uL (ref 0.0–0.5)
Eosinophils Relative: 0 %
HCT: 38.5 % — ABNORMAL LOW (ref 39.0–52.0)
Hemoglobin: 12.8 g/dL — ABNORMAL LOW (ref 13.0–17.0)
Immature Granulocytes: 1 %
Lymphocytes Relative: 15 %
Lymphs Abs: 1.6 10*3/uL (ref 0.7–4.0)
MCH: 26.7 pg (ref 26.0–34.0)
MCHC: 33.2 g/dL (ref 30.0–36.0)
MCV: 80.2 fL (ref 80.0–100.0)
Monocytes Absolute: 1.6 10*3/uL — ABNORMAL HIGH (ref 0.1–1.0)
Monocytes Relative: 14 %
Neutro Abs: 7.4 10*3/uL (ref 1.7–7.7)
Neutrophils Relative %: 70 %
Platelets: 324 10*3/uL (ref 150–400)
RBC: 4.8 MIL/uL (ref 4.22–5.81)
RDW: 15.4 % (ref 11.5–15.5)
WBC: 10.7 10*3/uL — ABNORMAL HIGH (ref 4.0–10.5)
nRBC: 0 % (ref 0.0–0.2)

## 2020-04-05 LAB — FIBRIN DERIVATIVES D-DIMER (ARMC ONLY): Fibrin derivatives D-dimer (ARMC): 1023.18 ng/mL (FEU) — ABNORMAL HIGH (ref 0.00–499.00)

## 2020-04-05 LAB — C-REACTIVE PROTEIN: CRP: 7.2 mg/dL — ABNORMAL HIGH (ref <1.0)

## 2020-04-05 LAB — MAGNESIUM: Magnesium: 1.8 mg/dL (ref 1.7–2.4)

## 2020-04-05 LAB — RESP PANEL BY RT-PCR (FLU A&B, COVID) ARPGX2
Influenza A by PCR: NEGATIVE
Influenza B by PCR: NEGATIVE
SARS Coronavirus 2 by RT PCR: POSITIVE — AB

## 2020-04-05 LAB — PHOSPHORUS: Phosphorus: 2.9 mg/dL (ref 2.5–4.6)

## 2020-04-05 NOTE — Progress Notes (Signed)
PROGRESS NOTE  Paul Bryan MBT:[id number] DOB: 14-Aug-1929 DOA: 04/03/2020 PCP: Paul Rud, MD   LOS: 2 days   Brief narrative: As per HPI,  Paul Bryan is a 86 y.o. male  with past medical history of dementia and hypertension was brought into the hospital from skilled nursing facility for evaluation of weakness.  Of note, patient tested positive for the COVID-19 virus on 11/23.  He is unvaccinated.    He was reported to have nonproductive cough with shortness of breath was found on the EMS was called in when he was noted to have a pulse ox of 8889% on room air.  Patient was given supplemental oxygen with improvement to the high 90s on 2 L.  Initial laboratory data was notable for creatinine of 2.2 above baseline of 1.7.  Lactate was within normal limits.  WBC within normal limits.  Chest x-ray showed bibasilar opacities.  In the ED, patient received remdesivir and Solu-Medrol and was considered for admission to the hospital.  Assessment/Plan:  Principal Problem:   Pneumonia due to COVID-19 virus Active Problems:   Hypertension   CKD (chronic kidney disease), stage III (HCC)   Dehydration   BPH (benign prostatic hyperplasia)   Acute respiratory failure (HCC)   Pneumonia due to COVID-19 virus with acute hypoxic respiratory Failure  Patient tested positive for Covid virus on 11/23 as per the hospital documentation by admitting provider.  Unvaccinated at the skilled nursing facility.  Continue remdesivir, IV dexamethasone supportive care with antitussives and bronchodilators.  Currently on 2 L of oxygen by nasal cannula.  Monitor inflammatory markers.  COVID-19 Labs  Recent Labs    04/03/20 0726 04/04/20 0544 04/05/20 0538  FERRITIN  --  86 93  CRP 11.9* 12.3*  --     Lab Results  Component Value Date   SARSCOV2NAA NEGATIVE 02/07/2019   SARSCOV2NAA NEGATIVE 02/04/2019   D-dimer elevated at 1023.  Ferritin 93  Dementia Continue galantamine, as needed Ativan,  trazodone and Seroquel.  Received 1 dose of IM Haldol yesterday.  Spoke with the patient's son about the agitation.  Secondary school teacher at bedside.  Will need frequent reorientation and guidance.  Hypertension Continue amlodipine, improved.  Acute kidney injury on stage IIIa chronic kidney disease Likely secondary to volume depletion and diuretics.  Creatinine today is 1.7.  We will continue to hold Lasix for now..  At baseline patient has a serum creatinine of 1.74 and  on admission it was 2.21.   BPH Continue Flomax p.o.   DVT prophylaxis: enoxaparin (LOVENOX) injection 30 mg Start: 04/03/20 2200   Code Status:  DNR  Family Communication: None today.  Spoke with the patient's son on the phone yesterday.  Status is: Inpatient  Remains inpatient appropriate because:IV treatments appropriate due to intensity of illness or inability to take PO, Inpatient level of care appropriate due to severity of illness and Covid pneumonia with hypoxic respiratory failure   Dispo: The patient is from: SNF              Anticipated d/c is to: SNF              Anticipated d/c date is: 2 days              Patient currently is not medically stable to d/c.  Consultants:  None  Procedures:  None  Antibiotics:  . Remdesivir 11/25>  Subjective:  Today, patient was seen and examined at bedside.  Nursing staff reported that he was  little agitated yesterday but was communicative with me today.  Denies any pain, nausea, vomiting, shortness of breath or fever.  Secondary school teacher at bedside. . Objective: Vitals:   04/04/20 2347 04/05/20 0500  BP: 135/70 132/72  Pulse: 69 70  Resp: 20 18  Temp: 98.5 F (36.9 C) 98.4 F (36.9 C)  SpO2: 94% 93%    Intake/Output Summary (Last 24 hours) at 04/05/2020 0754 Last data filed at 04/05/2020 0600 Gross per 24 hour  Intake --  Output 1400 ml  Net -1400 ml   Filed Weights   04/03/20 0722  Weight: 72.6 kg   Body mass index is 24.33 kg/m.    Physical Exam:  GENERAL: Patient is alert awake and communicative but disoriented and confused at times.  On 2 L of nasal cannula oxygen.   HENT: No scleral pallor or icterus. Pupils equally reactive to light. Oral mucosa is moist NECK: is supple, no gross swelling noted. CHEST: Decreased breath sounds bilaterally.  No crackles or wheezes noted. CVS: S1 and S2 heard, no murmur. Regular rate and rhythm.  ABDOMEN: Soft, non-tender, bowel sounds are present. EXTREMITIES: No edema. CNS: Cranial nerves are intact.  Moving all extremities.  Confused disoriented  SKIN: warm and dry without rashes.  Data Review: I have personally reviewed the following laboratory data and studies,  CBC: Recent Labs  Lab 04/03/20 0726 04/04/20 0544 04/05/20 0538  WBC 6.4 6.9 10.7*  NEUTROABS 4.1 5.2 7.4  HGB 12.4* 12.8* 12.8*  HCT 38.1* 37.5* 38.5*  MCV 82.6 79.6* 80.2  PLT 249 274 324   Basic Metabolic Panel: Recent Labs  Lab 04/03/20 0726 04/04/20 0544 04/05/20 0538  NA 135 138 139  K 3.7 3.6 3.8  CL 99 103 104  CO2 24 20* 23  GLUCOSE 107* 123* 84  BUN 27* 31* 36*  CREATININE 2.21* 1.73* 1.70*  CALCIUM 8.4* 8.3* 8.8*  MG  --  2.1 1.8  PHOS  --  4.0 2.9   Liver Function Tests: Recent Labs  Lab 04/03/20 0726 04/04/20 0544 04/05/20 0538  AST 36 56* 73*  ALT 25 31 37  ALKPHOS 48 49 52  BILITOT 0.6 0.3 0.6  PROT 6.9 6.7 6.6  ALBUMIN 3.3* 3.1* 3.2*   No results for input(s): LIPASE, AMYLASE in the last 168 hours. No results for input(s): AMMONIA in the last 168 hours. Cardiac Enzymes: No results for input(s): CKTOTAL, CKMB, CKMBINDEX, TROPONINI in the last 168 hours. BNP (last 3 results) Recent Labs    03/14/20 1929 04/03/20 0726  BNP 43.8 105.5*    ProBNP (last 3 results) No results for input(s): PROBNP in the last 8760 hours.  CBG: No results for input(s): GLUCAP in the last 168 hours. Recent Results (from the past 240 hour(s))  Blood culture (single)     Status:  None (Preliminary result)   Collection Time: 04/03/20  7:27 AM   Specimen: BLOOD  Result Value Ref Range Status   Specimen Description BLOOD RIGHT [facility]  Final   Special Requests   Final    BOTTLES DRAWN AEROBIC AND ANAEROBIC Blood Culture results may not be optimal due to an excessive volume of blood received in culture bottles   Culture   Final    NO GROWTH 2 DAYS Performed at [facility], [address] [place], [place] [address]    Report Status PENDING  Incomplete     Studies: No results found.    Joycelyn Das, MD  [facility] 04/05/2020

## 2020-04-05 NOTE — Plan of Care (Signed)
  Problem: Education: Goal: Knowledge of risk factors and measures for prevention of condition will improve Outcome: Progressing   Problem: Coping: Goal: Psychosocial and spiritual needs will be supported Outcome: Progressing   Problem: Respiratory: Goal: Complications related to the disease process, condition or treatment will be avoided or minimized Outcome: Progressing   Problem: Health Behavior/Discharge Planning: Goal: Ability to manage health-related needs will improve Outcome: Progressing   Problem: Clinical Measurements: Goal: Ability to maintain clinical measurements within normal limits will improve Outcome: Progressing Goal: Will remain free from infection Outcome: Progressing Goal: Diagnostic test results will improve Outcome: Progressing Goal: Respiratory complications will improve Outcome: Progressing Goal: Cardiovascular complication will be avoided Outcome: Progressing

## 2020-04-05 NOTE — Progress Notes (Signed)
Patient has been relaxed today. Has not fought or been upset however he has been pulling at his IV that was placed in his left AC. He is incontinent of bladder and bowel. Placed condom catheter on patient. Has attempted several times to touch it and remove it but has been redirected by the sitter in the room. His bed is in low position and call bell in reach with sitter at bedside.

## 2020-04-06 DIAGNOSIS — N183 Chronic kidney disease, stage 3 unspecified: Secondary | ICD-10-CM | POA: Diagnosis not present

## 2020-04-06 DIAGNOSIS — J9600 Acute respiratory failure, unspecified whether with hypoxia or hypercapnia: Secondary | ICD-10-CM | POA: Diagnosis not present

## 2020-04-06 DIAGNOSIS — U071 COVID-19: Secondary | ICD-10-CM | POA: Diagnosis not present

## 2020-04-06 DIAGNOSIS — N400 Enlarged prostate without lower urinary tract symptoms: Secondary | ICD-10-CM | POA: Diagnosis not present

## 2020-04-06 LAB — CBC WITH DIFFERENTIAL/PLATELET
Abs Immature Granulocytes: 0.08 10*3/uL — ABNORMAL HIGH (ref 0.00–0.07)
Basophils Absolute: 0 10*3/uL (ref 0.0–0.1)
Basophils Relative: 0 %
Eosinophils Absolute: 0 10*3/uL (ref 0.0–0.5)
Eosinophils Relative: 0 %
HCT: 38.6 % — ABNORMAL LOW (ref 39.0–52.0)
Hemoglobin: 13.1 g/dL (ref 13.0–17.0)
Immature Granulocytes: 1 %
Lymphocytes Relative: 9 %
Lymphs Abs: 0.7 10*3/uL (ref 0.7–4.0)
MCH: 27 pg (ref 26.0–34.0)
MCHC: 33.9 g/dL (ref 30.0–36.0)
MCV: 79.6 fL — ABNORMAL LOW (ref 80.0–100.0)
Monocytes Absolute: 0.8 10*3/uL (ref 0.1–1.0)
Monocytes Relative: 10 %
Neutro Abs: 6.3 10*3/uL (ref 1.7–7.7)
Neutrophils Relative %: 80 %
Platelets: 341 10*3/uL (ref 150–400)
RBC: 4.85 MIL/uL (ref 4.22–5.81)
RDW: 15.4 % (ref 11.5–15.5)
WBC: 7.8 10*3/uL (ref 4.0–10.5)
nRBC: 0 % (ref 0.0–0.2)

## 2020-04-06 LAB — COMPREHENSIVE METABOLIC PANEL
ALT: 54 U/L — ABNORMAL HIGH (ref 0–44)
AST: 81 U/L — ABNORMAL HIGH (ref 15–41)
Albumin: 3.1 g/dL — ABNORMAL LOW (ref 3.5–5.0)
Alkaline Phosphatase: 54 U/L (ref 38–126)
Anion gap: 15 (ref 5–15)
BUN: 46 mg/dL — ABNORMAL HIGH (ref 8–23)
CO2: 21 mmol/L — ABNORMAL LOW (ref 22–32)
Calcium: 8.8 mg/dL — ABNORMAL LOW (ref 8.9–10.3)
Chloride: 103 mmol/L (ref 98–111)
Creatinine, Ser: 1.94 mg/dL — ABNORMAL HIGH (ref 0.61–1.24)
GFR, Estimated: 33 mL/min — ABNORMAL LOW (ref >60)
Glucose, Bld: 130 mg/dL — ABNORMAL HIGH (ref 70–99)
Potassium: 4.3 mmol/L (ref 3.5–5.1)
Sodium: 139 mmol/L (ref 135–145)
Total Bilirubin: 0.5 mg/dL (ref 0.3–1.2)
Total Protein: 6.8 g/dL (ref 6.5–8.1)

## 2020-04-06 LAB — FIBRIN DERIVATIVES D-DIMER (ARMC ONLY): Fibrin derivatives D-dimer (ARMC): 937.51 ng/mL (FEU) — ABNORMAL HIGH (ref 0.00–499.00)

## 2020-04-06 LAB — FERRITIN: Ferritin: 92 ng/mL (ref 24–336)

## 2020-04-06 LAB — C-REACTIVE PROTEIN: CRP: 15.5 mg/dL — ABNORMAL HIGH (ref <1.0)

## 2020-04-06 LAB — GLUCOSE, CAPILLARY: Glucose-Capillary: 120 mg/dL — ABNORMAL HIGH (ref 70–99)

## 2020-04-06 LAB — PHOSPHORUS: Phosphorus: 5.1 mg/dL — ABNORMAL HIGH (ref 2.5–4.6)

## 2020-04-06 LAB — MAGNESIUM: Magnesium: 1.9 mg/dL (ref 1.7–2.4)

## 2020-04-06 NOTE — Evaluation (Signed)
Physical Therapy Evaluation Patient Details Name: Paul Bryan MRN: [id number] DOB: 08/05/1929 Today's Date: 04/06/2020   History of Present Illness  Pt is an 86 y/o M admitted from [facility] on 04/03/20 with c/c of weakness after being found on floor & too weak to get up. Pt tested positive for Covid 19 on 04/01/20. Pt admitted for tx of pneumonia due to covid 19. PMH: dementia, HTN, restless leg syndrome  Clinical Impression  Pt agreeable to tx but does present with confusion & impaired orientation. Prior to admission pt was at [facility] - unsure of mobility level & pt is a poor historian. Pt currently requires mod assist for bed mobility, transfers, and min assist (2nd person to manage lines/IV pole) for gait with RW. Pt requires heavy cuing for sequencing all movements (standing to turn from [facility] to face RW). Pt with incontinent BM during session requiring total assist for peri hygiene. Pt would benefit from continued skilled PT treatment to focus on transfers & gait with LRAD, as well as for strength & endurance training.     Follow Up Recommendations SNF;Supervision/Assistance - 24 hour    Equipment Recommendations  Rolling walker with 5" wheels    Recommendations for Other Services       Precautions / Restrictions Precautions Precautions: Fall;Other (comment) Precaution Comments: 1:1 sitter Restrictions Weight Bearing Restrictions: No      Mobility  Bed Mobility Overal bed mobility: Needs Assistance Bed Mobility: Supine to Sit     Supine to sit: HOB elevated;Mod assist     General bed mobility comments: assist for sequencing t/f and trunk control     Transfers Overall transfer level: Needs assistance   Transfers: Sit to/from Stand;Stand Pivot Transfers Sit to Stand: Mod assist Stand pivot transfers: Mod assist       General transfer comment: MOD A for lift off and sequencing standing. Anticipate improved c RW use  Ambulation/Gait Ambulation/Gait  assistance: Min assist;+2 physical assistance (+2 assist to manage lines/IV) Gait Distance (Feet): 12 Feet Assistive device: Rolling walker (2 wheeled) Gait Pattern/deviations: Decreased stride length;Decreased step length - left;Decreased step length - right Gait velocity: decreased      Stairs            Wheelchair Mobility    Modified Rankin (Stroke Patients Only)       Balance Overall balance assessment: Needs assistance Sitting-balance support: Bilateral upper extremity supported;Feet supported Sitting balance-Leahy Scale: Poor Sitting balance - Comments: MOD-MIN A for sitting balance Postural control: Posterior lean Standing balance support: Bilateral upper extremity supported Standing balance-Leahy Scale: Poor Standing balance comment: initial posterior lean with cuing for anterior shift                             Pertinent Vitals/Pain Pain Assessment: Faces Faces Pain Scale: No hurt    Home Living Family/patient expects to be discharged to:: Skilled nursing facility                 Additional Comments: Pt from [facility] per [facility], pt reports he does not live alone but unable to state who    Prior Function Level of Independence: Needs assistance         Comments: Pt unreliable historian however reports no AD for mobility     Hand Dominance   Dominant Hand: Right    Extremity/Trunk Assessment   Upper Extremity Assessment Upper Extremity Assessment: Generalized weakness    Lower  Extremity Assessment Lower Extremity Assessment: Generalized weakness    Cervical / Trunk Assessment Cervical / Trunk Assessment: Kyphotic (posterior pelvic tilt)  Communication   Communication: HOH  Cognition Arousal/Alertness: Awake/alert Behavior During Therapy: WFL for tasks assessed/performed Overall Cognitive Status: No family/caregiver present to determine baseline cognitive functioning                                  General Comments: Pt has hx of dementia. Oriented to self only - appropriately answers place as hospital when given categorical options      General Comments General comments (skin integrity, edema, etc.): SpO2 96% on room air throughout session    Exercises    Assessment/Plan    PT Assessment Patient needs continued PT services  PT Problem List Decreased strength;Decreased mobility;Decreased safety awareness;Decreased activity tolerance;Decreased cognition;Cardiopulmonary status limiting activity;Decreased balance;Decreased knowledge of use of DME       PT Treatment Interventions DME instruction;Therapeutic exercise;Gait training;Balance training;Neuromuscular re-education;Functional mobility training;Cognitive remediation;Therapeutic activities;Patient/family education    PT Goals (Current goals can be found in the Care Plan section)  Acute Rehab PT Goals Patient Stated Goal: To return home PT Goal Formulation: With patient Time For Goal Achievement: 04/20/20 Potential to Achieve Goals: Good    Frequency Min 2X/week   Barriers to discharge        Co-evaluation   Reason for Co-Treatment: Necessary to address cognition/behavior during functional activity;For patient/therapist safety PT goals addressed during session: Mobility/safety with mobility;Balance;Proper use of DME;Strengthening/ROM OT goals addressed during session: ADL's and self-care;Proper use of Adaptive equipment and DME       AM-PAC PT "6 Clicks" Mobility  Outcome Measure Help needed turning from your back to your side while in a flat bed without using bedrails?: A Lot Help needed moving from lying on your back to sitting on the side of a flat bed without using bedrails?: A Lot Help needed moving to and from a bed to a chair (including a wheelchair)?: A Little Help needed standing up from a chair using your arms (e.g., wheelchair or bedside chair)?: A Lot Help needed to walk in hospital room?: A  Little Help needed climbing 3-5 steps with a railing? : A Lot 6 Click Score: 14    End of Session Equipment Utilized During Treatment: Gait belt Activity Tolerance: Patient tolerated treatment well Patient left: in chair;with call bell/phone within reach;with nursing/sitter in room Nurse Communication: Mobility status PT Visit Diagnosis: Unsteadiness on feet (R26.81);Difficulty in walking, not elsewhere classified (R26.2);Muscle weakness (generalized) (M62.81)    Time: 1020-1045 PT Time Calculation (min) (ACUTE ONLY): 25 min   Charges:   PT Evaluation $PT Eval Low Complexity: 1 Low PT Treatments $Therapeutic Activity: 8-22 mins        Aleda Grana, PT, DPT 04/06/20, 12:24 PM   Sandi Mariscal 04/06/2020, 12:22 PM

## 2020-04-06 NOTE — Evaluation (Signed)
Occupational Therapy Evaluation Patient Details Name: Paul Bryan MRN: [id number] DOB: 23-Jan-1930 Today's Date: 04/06/2020    History of Present Illness Paul Bryan is a 86 y.o. male  with past medical history of dementia and hypertension was brought into the hospital from skilled nursing facility for evaluation of weakness.  Of note, patient tested positive for the COVID-19 virus on 11/23.    Clinical Impression   Paul Bryan was seen for OT/PT co-evaluation this date. Pt is unreliable historian, A&O self only (knows hospital given choices); reports living with someone but unsure who and states no AD used. Per [facility] pt came from [facility]. Pt presents to acute OT demonstrating impaired ADL performance and functional mobility 2/2 poor insight into deficits, decreased activity tolerance, and functional strength/balance deficits.   Upon arrival pt reclined in bed sleeping, rouses easily to light touch and agreeable to session. MOD A sup>sit - assist for sequencing task and trunk elevation. MIN A self-drinking seated EOB - assist for sitting balance 2/2 posterior lean. Pt reports need for BM, incontinent of bowels during SPT to [facility]. MOD A for SPT bed>BSC (assist for sequencing) improving to CGA + RW for simulated toilet t/f ~10 ft. MAX A perihygiene in standing. SpO2 96% on RA t/o session. Left up in chair c sitter in room. Pt would benefit from skilled OT to address noted impairments and functional limitations (see below for any additional details) in order to maximize safety and independence while minimizing falls risk and caregiver burden. Upon hospital discharge, recommend STR to maximize pt safety and return to PLOF.     Follow Up Recommendations  SNF    Equipment Recommendations  Other (comment) (TBD at next venue of care)    Recommendations for Other Services       Precautions / Restrictions Precautions Precautions: Fall;Other (comment) (delirium pcns, 1:1  sitter) Restrictions Weight Bearing Restrictions: No      Mobility Bed Mobility Overal bed mobility: Needs Assistance Bed Mobility: Supine to Sit     Supine to sit: HOB elevated;Mod assist     General bed mobility comments: assist for sequencing t/f and trunk control     Transfers Overall transfer level: Needs assistance   Transfers: Sit to/from Stand;Stand Pivot Transfers Sit to Stand: Mod assist Stand pivot transfers: Mod assist       General transfer comment: MOD A for lift off and sequencing standing. Anticipate improved c RW use    Balance Overall balance assessment: Needs assistance Sitting-balance support: Bilateral upper extremity supported;Feet supported Sitting balance-Leahy Scale: Poor Sitting balance - Comments: MOD-MIN A for sitting balance Postural control: Posterior lean Standing balance support: Bilateral upper extremity supported Standing balance-Leahy Scale: Poor        ADL either performed or assessed with clinical judgement   ADL Overall ADL's : Needs assistance/impaired      General ADL Comments: MOD A for SPT bed>BSC (assist for sequencing) improving to CGA + RW for simulated toilet t/f ~10 ft. MAX A perihygiene in standing - assist for standing balance and perihygiene. MIN A self-drinking seated EOB - assist for sitting balance 2/2 posterior lean             Pertinent Vitals/Pain Pain Assessment: No/denies pain     Hand Dominance Right   Extremity/Trunk Assessment Upper Extremity Assessment Upper Extremity Assessment: Generalized weakness   Lower Extremity Assessment Lower Extremity Assessment: Generalized weakness       Communication Communication Communication: HOH   Cognition Arousal/Alertness:  Awake/alert Behavior During Therapy: WFL for tasks assessed/performed Overall Cognitive Status: No family/caregiver present to determine baseline cognitive functioning      General Comments: Pt has hx of dementia. Oriented to  self only - appropriately answers place as hospital when given categorical options   General Comments  SpO2 96% on RA t/o     Exercises Exercises: Other exercises Other Exercises Other Exercises: Pt educated re: OT role, DME recs, d/c recs, fallls prevention, ECS, HEP Other Exercises: UBD, toileting, sup>sit, sit<>stand, sitting/standing balance/toelrance, ~10 ft mobility   Shoulder Instructions      Home Living Family/patient expects to be discharged to:: Skilled nursing facility      Additional Comments: Pt from [facility] per [facility], pt reports he does not live alone but unable to state who      Prior Functioning/Environment Level of Independence: Needs assistance        Comments: Pt unreliable historian however reports no AD for mobility        OT Problem List: Decreased strength;Decreased activity tolerance;Decreased range of motion;Impaired balance (sitting and/or standing);Decreased cognition;Decreased safety awareness      OT Treatment/Interventions: Self-care/ADL training;Therapeutic exercise;Energy conservation;DME and/or AE instruction;Therapeutic activities;Patient/family education;Balance training    OT Goals(Current goals can be found in the care plan section) Acute Rehab OT Goals Patient Stated Goal: To return home OT Goal Formulation: With patient Time For Goal Achievement: 04/20/20 Potential to Achieve Goals: Good ADL Goals Pt Will Perform Grooming: with min guard assist;standing (c LRAD PRN) Pt Will Perform Lower Body Dressing: sit to/from stand;with min assist (c LRAD PRN) Pt Will Transfer to Toilet: with modified independence;ambulating;regular height toilet (c LRAD PRN)  OT Frequency: Min 1X/week           Co-evaluation PT/OT/SLP Co-Evaluation/Treatment: Yes Reason for Co-Treatment: Necessary to address cognition/behavior during functional activity;For patient/therapist safety PT goals addressed during session: Mobility/safety with  mobility;Proper use of DME OT goals addressed during session: ADL's and self-care;Proper use of Adaptive equipment and DME      AM-PAC OT "6 Clicks" Daily Activity     Outcome Measure Help from another person eating meals?: A Little Help from another person taking care of personal grooming?: A Little Help from another person toileting, which includes using toliet, bedpan, or urinal?: A Lot Help from another person bathing (including washing, rinsing, drying)?: A Lot Help from another person to put on and taking off regular upper body clothing?: A Little Help from another person to put on and taking off regular lower body clothing?: A Lot 6 Click Score: 15   End of Session Equipment Utilized During Treatment: Gait belt;Rolling walker Nurse Communication: Mobility status  Activity Tolerance: Patient tolerated treatment well Patient left: in chair;with call bell/phone within reach;with nursing/sitter in room  OT Visit Diagnosis: Other abnormalities of gait and mobility (R26.89);Muscle weakness (generalized) (M62.81)                Time: 7408-1448 OT Time Calculation (min): 29 min Charges:  OT General Charges $OT Visit: 1 Visit OT Evaluation $OT Eval Moderate Complexity: 1 Mod OT Treatments $Self Care/Home Management : 8-22 mins  Kathie Dike, M.S. OTR/L  04/06/20, 11:22 AM  ascom [phone]

## 2020-04-06 NOTE — Progress Notes (Signed)
Spoke with patients son and gave update on patients condition.

## 2020-04-06 NOTE — Progress Notes (Signed)
   04/06/20 1623  Assess: MEWS Score  Temp 97.6 F (36.4 C)  BP 123/69  Pulse Rate (!) 127  Resp 18  Level of Consciousness Alert  SpO2 95 %  O2 Device Room Air  Patient Activity (if Appropriate) In bed  Assess: MEWS Score  MEWS Temp 0  MEWS Systolic 0  MEWS Pulse 2  MEWS RR 0  MEWS LOC 0  MEWS Score 2  MEWS Score Color Yellow  Assess: if the MEWS score is Yellow or Red  Were vital signs taken at a resting state? Yes  Focused Assessment Change from prior assessment (see assessment flowsheet)  Early Detection of Sepsis Score *See Row Information* Low  MEWS guidelines implemented *See Row Information* Yes  Treat  MEWS Interventions Other (Comment) (assessed)  Pain Scale 0-10  Pain Score 0  Take Vital Signs  Increase Vital Sign Frequency  Yellow: Q 2hr X 2 then Q 4hr X 2, if remains yellow, continue Q 4hrs  Escalate  MEWS: Escalate Yellow: discuss with charge nurse/RN and consider discussing with provider and RRT  Notify: Charge Nurse/RN  Name of Charge Nurse/RN Notified Grier Mitts, RN  Date Charge Nurse/RN Notified 03/30/20  Time Charge Nurse/RN Notified 1630  Notify: Provider  Provider Name/Title Dr. Tyson Babinski  Date Provider Notified 04/06/20  Time Provider Notified 1640  Notification Type  (private messaged)  Notification Reason Change in status  Response No new orders  Date of Provider Response 04/06/20  Time of Provider Response 1642  Document  Patient Outcome Other (Comment) ( HR came back down to normal, will continue to monitor )  Progress note created (see row info) Yes

## 2020-04-06 NOTE — Progress Notes (Signed)
PROGRESS NOTE  Paul Bryan SKA:[id number] DOB: [birth year] DOA: 04/03/2020 PCP: Kandyce Rud, MD   LOS: 3 days   Brief narrative: As per HPI,  Paul Bryan is a 86 y.o. male  with past medical history of dementia and hypertension was brought into the hospital from skilled nursing facility for evaluation of weakness.  Of note, patient tested positive for the COVID-19 virus on 11/23.  He is unvaccinated.    He was reported to have nonproductive cough with shortness of breath was found on the EMS was called in when he was noted to have a pulse ox of 88-89% on room air.  Patient was given supplemental oxygen with improvement to the high 90s on 2 L.  Initial laboratory data was notable for creatinine of 2.2 above baseline of 1.7.  Lactate was within normal limits.  WBC within normal limits.  Chest x-ray showed bibasilar opacities.  In the ED, patient received remdesivir and Solu-Medrol and was considered for admission to the hospital.  Assessment/Plan:  Principal Problem:   Pneumonia due to COVID-19 virus Active Problems:   Hypertension   CKD (chronic kidney disease), stage III (HCC)   Dehydration   BPH (benign prostatic hyperplasia)   Acute respiratory failure (HCC)   Pneumonia due to COVID-19 virus with acute hypoxic respiratory Failure  Patient tested positive for Covid virus on 04/01/20 and positive on repeat 04/05/20.  Continue remdesivir, IV dexamethasone, supportive care with antitussives and bronchodilators.  Currently on room air .  Continue to monitor inflammatory markers.  Patient should be stable for discharge to nursing home with oral prednisone plus minus supplemental oxygen  COVID-19 Labs  Recent Labs    04/04/20 0544 04/05/20 0538 04/06/20 0447  FERRITIN 86 93 92  CRP 12.3* 7.2* 15.5*    Lab Results  Component Value Date   SARSCOV2NAA POSITIVE (A) 04/05/2020   SARSCOV2NAA NEGATIVE 02/07/2019   SARSCOV2NAA NEGATIVE 02/04/2019   D-dimer elevated at  1023>937.  Ferritin 93>92  Dementia Continue galantamine, as needed Ativan, trazodone and Seroquel. Secondary school teacher at bedside.  Will need frequent reorientation and guidance.  Change to telemetry sitter today.  Hypertension Continue amlodipine, improved.  Acute kidney injury on stage IIIa chronic kidney disease Likely secondary to volume depletion and diuretics.  Creatinine today is 1.9.,  Baseline serum creatinine of 1.74 and  on admission it was 2.21.   Medically stable at this time.  BPH Continue Flomax p.o.   DVT prophylaxis: enoxaparin (LOVENOX) injection 30 mg Start: 04/03/20 2200   Code Status:  DNR  Family Communication: None today.   Status is: Inpatient  Remains inpatient appropriate because:IV treatments appropriate due to intensity of illness or inability to take PO, Inpatient level of care appropriate due to severity of illness and Covid pneumonia with hypoxic respiratory failure   Dispo: The patient is from: SNF              Anticipated d/c is to: SNF              Anticipated d/c date is: 1 to 2 days when bed is available              Patient currently is  medically stable to d/c.  Consultants:  None  Procedures:  None  Antibiotics:  . Remdesivir 11/25>  Subjective:  Today, patient was seen and examined at bedside.  Denies any dyspnea shortness of breath but has mild cough.  Has baseline confusion.  Working with physical therapy, occupational therapy  at bedside. . Objective: Vitals:   04/06/20 0557 04/06/20 0809  BP: (!) 146/73 (!) 141/63  Pulse: 67 87  Resp: 20 20  Temp: 98.8 F (37.1 C) 98 F (36.7 C)  SpO2: 96% 91%    Intake/Output Summary (Last 24 hours) at 04/06/2020 1054 Last data filed at 04/05/2020 1800 Gross per 24 hour  Intake 100 ml  Output --  Net 100 ml   Filed Weights   04/03/20 0722  Weight: 72.6 kg   Body mass index is 24.33 kg/m.   Physical Exam:  General:  Average built, not in obvious distress, on  room air HENT:   No scleral pallor or icterus noted. Oral mucosa is moist.  Chest: Coarse breath sounds noted CVS: S1 &S2 heard. No murmur.  Regular rate and rhythm. Abdomen: Soft, nontender, nondistended.  Bowel sounds are heard.   Extremities: No cyanosis, clubbing or edema.  Peripheral pulses are palpable. Psych: Alert, awake and disoriented, calmer today. CNS:  No cranial nerve deficits.  Power equal in all extremities.   Skin: Warm and dry.  No rashes noted.  Data Review: I have personally reviewed the following laboratory data and studies,  CBC: Recent Labs  Lab 04/03/20 0726 04/04/20 0544 04/05/20 0538 04/06/20 0447  WBC 6.4 6.9 10.7* 7.8  NEUTROABS 4.1 5.2 7.4 6.3  HGB 12.4* 12.8* 12.8* 13.1  HCT 38.1* 37.5* 38.5* 38.6*  MCV 82.6 79.6* 80.2 79.6*  PLT 249 274 324 341   Basic Metabolic Panel: Recent Labs  Lab 04/03/20 0726 04/04/20 0544 04/05/20 0538 04/06/20 0447  NA 135 138 139 139  K 3.7 3.6 3.8 4.3  CL 99 103 104 103  CO2 24 20* 23 21*  GLUCOSE 107* 123* 84 130*  BUN 27* 31* 36* 46*  CREATININE 2.21* 1.73* 1.70* 1.94*  CALCIUM 8.4* 8.3* 8.8* 8.8*  MG  --  2.1 1.8 1.9  PHOS  --  4.0 2.9 5.1*   Liver Function Tests: Recent Labs  Lab 04/03/20 0726 04/04/20 0544 04/05/20 0538 04/06/20 0447  AST 36 56* 73* 81*  ALT 25 31 37 54*  ALKPHOS 48 49 52 54  BILITOT 0.6 0.3 0.6 0.5  PROT 6.9 6.7 6.6 6.8  ALBUMIN 3.3* 3.1* 3.2* 3.1*   No results for input(s): LIPASE, AMYLASE in the last 168 hours. No results for input(s): AMMONIA in the last 168 hours. Cardiac Enzymes: No results for input(s): CKTOTAL, CKMB, CKMBINDEX, TROPONINI in the last 168 hours. BNP (last 3 results) Recent Labs    03/14/20 1929 04/03/20 0726  BNP 43.8 105.5*    ProBNP (last 3 results) No results for input(s): PROBNP in the last 8760 hours.  CBG: Recent Labs  Lab 04/06/20 0808  GLUCAP 120*   Recent Results (from the past 240 hour(s))  Blood culture (single)     Status:  None (Preliminary result)   Collection Time: 04/03/20  7:27 AM   Specimen: BLOOD  Result Value Ref Range Status   Specimen Description BLOOD RIGHT [facility]  Final   Special Requests   Final    BOTTLES DRAWN AEROBIC AND ANAEROBIC Blood Culture results may not be optimal due to an excessive volume of blood received in culture bottles   Culture   Final    NO GROWTH 3 DAYS Performed at [facility], [address] [place], [place] [address]    Report Status PENDING  Incomplete  Resp Panel by RT-PCR (Flu A&B, Covid) Nasopharyngeal Swab     Status: Abnormal  Collection Time: 04/05/20  9:08 AM   Specimen: Nasopharyngeal Swab; Nasopharyngeal(NP) swabs in vial transport medium  Result Value Ref Range Status   SARS Coronavirus 2 by RT PCR POSITIVE (A) NEGATIVE Final    Comment: RESULT CALLED TO, READ BACK BY AND VERIFIED WITH: EMILY RUIZ 04/05/20 AT 1202 BY ACR (NOTE) SARS-CoV-2 target nucleic acids are DETECTED.  The SARS-CoV-2 RNA is generally detectable in upper respiratory specimens during the acute phase of infection. Positive results are indicative of the presence of the identified virus, but do not rule out bacterial infection or co-infection with other pathogens not detected by the test. Clinical correlation with patient history and other diagnostic information is necessary to determine patient infection status. The expected result is Negative.  Fact Sheet for Patients: BloggerCourse.com  Fact Sheet for Healthcare Providers: SeriousBroker.it  This test is not yet approved or cleared by the [place] FDA and  has been authorized for detection and/or diagnosis of SARS-CoV-2 by FDA under an Emergency Use Authorization (EUA).  This EUA will remain in effect (meaning this test can b e used) for the duration of  the COVID-19 declaration under Section 564(b)(1) of the Act, 21 U.S.C. section 360bbb-3(b)(1), unless the  authorization is terminated or revoked sooner.     Influenza A by PCR NEGATIVE NEGATIVE Final   Influenza B by PCR NEGATIVE NEGATIVE Final    Comment: (NOTE) The Xpert Xpress SARS-CoV-2/FLU/RSV plus assay is intended as an aid in the diagnosis of influenza from Nasopharyngeal swab specimens and should not be used as a sole basis for treatment. Nasal washings and aspirates are unacceptable for Xpert Xpress SARS-CoV-2/FLU/RSV testing.  Fact Sheet for Patients: BloggerCourse.com  Fact Sheet for Healthcare Providers: SeriousBroker.it  This test is not yet approved or cleared by the [place] FDA and has been authorized for detection and/or diagnosis of SARS-CoV-2 by FDA under an Emergency Use Authorization (EUA). This EUA will remain in effect (meaning this test can be used) for the duration of the COVID-19 declaration under Section 564(b)(1) of the Act, 21 U.S.C. section 360bbb-3(b)(1), unless the authorization is terminated or revoked.  Performed at [facility], [address] [place], [place] [address]      Studies: No results found.    Joycelyn Das, MD  [facility] 04/06/2020

## 2020-04-07 DIAGNOSIS — N400 Enlarged prostate without lower urinary tract symptoms: Secondary | ICD-10-CM | POA: Diagnosis not present

## 2020-04-07 DIAGNOSIS — N183 Chronic kidney disease, stage 3 unspecified: Secondary | ICD-10-CM | POA: Diagnosis not present

## 2020-04-07 DIAGNOSIS — J9600 Acute respiratory failure, unspecified whether with hypoxia or hypercapnia: Secondary | ICD-10-CM | POA: Diagnosis not present

## 2020-04-07 DIAGNOSIS — U071 COVID-19: Secondary | ICD-10-CM | POA: Diagnosis not present

## 2020-04-07 LAB — COMPREHENSIVE METABOLIC PANEL
ALT: 43 U/L (ref 0–44)
AST: 60 U/L — ABNORMAL HIGH (ref 15–41)
Albumin: 2.9 g/dL — ABNORMAL LOW (ref 3.5–5.0)
Alkaline Phosphatase: 50 U/L (ref 38–126)
Anion gap: 11 (ref 5–15)
BUN: 52 mg/dL — ABNORMAL HIGH (ref 8–23)
CO2: 22 mmol/L (ref 22–32)
Calcium: 8.3 mg/dL — ABNORMAL LOW (ref 8.9–10.3)
Chloride: 102 mmol/L (ref 98–111)
Creatinine, Ser: 1.83 mg/dL — ABNORMAL HIGH (ref 0.61–1.24)
GFR, Estimated: 35 mL/min — ABNORMAL LOW (ref >60)
Glucose, Bld: 102 mg/dL — ABNORMAL HIGH (ref 70–99)
Potassium: 3.9 mmol/L (ref 3.5–5.1)
Sodium: 135 mmol/L (ref 135–145)
Total Bilirubin: 0.6 mg/dL (ref 0.3–1.2)
Total Protein: 6.4 g/dL — ABNORMAL LOW (ref 6.5–8.1)

## 2020-04-07 LAB — CBC WITH DIFFERENTIAL/PLATELET
Abs Immature Granulocytes: 0.17 10*3/uL — ABNORMAL HIGH (ref 0.00–0.07)
Basophils Absolute: 0 10*3/uL (ref 0.0–0.1)
Basophils Relative: 0 %
Eosinophils Absolute: 0 10*3/uL (ref 0.0–0.5)
Eosinophils Relative: 0 %
HCT: 38.7 % — ABNORMAL LOW (ref 39.0–52.0)
Hemoglobin: 12.2 g/dL — ABNORMAL LOW (ref 13.0–17.0)
Immature Granulocytes: 1 %
Lymphocytes Relative: 9 %
Lymphs Abs: 1.2 10*3/uL (ref 0.7–4.0)
MCH: 26.7 pg (ref 26.0–34.0)
MCHC: 31.5 g/dL (ref 30.0–36.0)
MCV: 84.7 fL (ref 80.0–100.0)
Monocytes Absolute: 1.5 10*3/uL — ABNORMAL HIGH (ref 0.1–1.0)
Monocytes Relative: 12 %
Neutro Abs: 10 10*3/uL — ABNORMAL HIGH (ref 1.7–7.7)
Neutrophils Relative %: 78 %
Platelets: 344 10*3/uL (ref 150–400)
RBC: 4.57 MIL/uL (ref 4.22–5.81)
RDW: 15.8 % — ABNORMAL HIGH (ref 11.5–15.5)
WBC: 12.9 10*3/uL — ABNORMAL HIGH (ref 4.0–10.5)
nRBC: 0 % (ref 0.0–0.2)

## 2020-04-07 LAB — D-DIMER, QUANTITATIVE: D-Dimer, Quant: 0.56 ug/mL-FEU — ABNORMAL HIGH (ref 0.00–0.50)

## 2020-04-07 LAB — C-REACTIVE PROTEIN: CRP: 11.2 mg/dL — ABNORMAL HIGH (ref <1.0)

## 2020-04-07 LAB — PHOSPHORUS: Phosphorus: 3.4 mg/dL (ref 2.5–4.6)

## 2020-04-07 LAB — FERRITIN: Ferritin: 83 ng/mL (ref 24–336)

## 2020-04-07 LAB — MAGNESIUM: Magnesium: 1.9 mg/dL (ref 1.7–2.4)

## 2020-04-07 NOTE — NC FL2 (Signed)
[place] MEDICAID FL2 LEVEL OF CARE SCREENING TOOL     IDENTIFICATION  Patient Name: Paul Bryan Birthdate: [birth year] Sex: male Admission Date (Current Location): 04/03/2020  [place] Number:  Chiropodist and Address:  [facility], [address] [place], [place] [address]      Provider Number: [id number]  Attending Physician Name and Address:  Joycelyn Das, MD  Relative Name and Phone Number:  Adyen, Bifulco" (Son) [phone] [facility])    Current Level of Care: Hospital Recommended Level of Care: Skilled Nursing Facility Prior Approval Number:    Date Approved/Denied:   PASRR Number:    Discharge Plan: SNF    Current Diagnoses: Patient Active Problem List   Diagnosis Date Noted  . Pneumonia due to COVID-19 virus 04/03/2020  . Hypertension   . CKD (chronic kidney disease), stage III (HCC)   . Dehydration   . BPH (benign prostatic hyperplasia)   . Acute respiratory failure (HCC)   . Altered mental status 02/05/2019  . GIB (gastrointestinal bleeding) 10/09/2017    Orientation RESPIRATION BLADDER Height & Weight     Self  Normal Continent Weight: 160 lb (72.6 kg) Height:  5\' 8"  (172.7 cm)  BEHAVIORAL SYMPTOMS/MOOD NEUROLOGICAL BOWEL NUTRITION STATUS     (Alzheimer's) Continent Diet (2 gram sodium; thin liquids)  AMBULATORY STATUS COMMUNICATION OF NEEDS Skin   Limited Assist Verbally Bruising (bruising - buttocks)                       Personal Care Assistance Level of Assistance  Bathing, Feeding, Dressing Bathing Assistance: Maximum assistance Feeding assistance: Limited assistance Dressing Assistance: Maximum assistance     Functional Limitations Info             SPECIAL CARE FACTORS FREQUENCY  PT (By licensed PT), OT (By licensed OT)     PT Frequency: 5 x/week OT Frequency: 5 x/week            Contractures      Additional Factors Info  Code Status, Allergies Code Status  Info: DNR Allergies Info: NKA           Current Medications (04/07/2020):  This is the current hospital active medication list Current Facility-Administered Medications  Medication Dose Route Frequency Provider Last Rate Last Admin  . 0.9 %  sodium chloride infusion  250 mL Intravenous PRN Agbata, Tochukwu, MD      . acetaminophen (TYLENOL) tablet 650 mg  650 mg Oral Q6H PRN Agbata, Tochukwu, MD      . albuterol (VENTOLIN HFA) 108 (90 Base) MCG/ACT inhaler 2 puff  2 puff Inhalation Q6H Agbata, Tochukwu, MD   2 puff at 04/06/20 1200  . amLODipine (NORVASC) tablet 2.5 mg  2.5 mg Oral Daily Agbata, Tochukwu, MD   2.5 mg at 04/06/20 0837  . ascorbic acid (VITAMIN C) tablet 500 mg  500 mg Oral Daily Agbata, Tochukwu, MD   500 mg at 04/06/20 0839  . aspirin EC tablet 81 mg  81 mg Oral Daily Agbata, Tochukwu, MD   81 mg at 04/06/20 0835  . dexamethasone (DECADRON) injection 6 mg  6 mg Intravenous Q24H Agbata, Tochukwu, MD   6 mg at 04/05/20 1645  . enoxaparin (LOVENOX) injection 30 mg  30 mg Subcutaneous Q24H Agbata, Tochukwu, MD   30 mg at 04/06/20 2029  . famotidine (PEPCID) tablet 20 mg  20 mg Oral Daily Agbata, Tochukwu, MD   20 mg at  04/06/20 0837  . galantamine (RAZADYNE) tablet 8 mg  8 mg Oral Daily Agbata, Tochukwu, MD   8 mg at 04/06/20 0836  . guaiFENesin-dextromethorphan (ROBITUSSIN DM) 100-10 MG/5ML syrup 10 mL  10 mL Oral Q4H PRN Agbata, Tochukwu, MD   10 mL at 04/07/20 0222  . LORazepam (ATIVAN) tablet 0.5 mg  0.5 mg Oral BID PRN Agbata, Tochukwu, MD   0.5 mg at 04/04/20 1253  . melatonin tablet 5 mg  5 mg Oral QHS Agbata, Tochukwu, MD   5 mg at 04/06/20 2029  . ondansetron (ZOFRAN) tablet 4 mg  4 mg Oral Q6H PRN Agbata, Tochukwu, MD       Or  . ondansetron (ZOFRAN) injection 4 mg  4 mg Intravenous Q6H PRN Agbata, Tochukwu, MD      . QUEtiapine (SEROQUEL) tablet 25 mg  25 mg Oral QHS PRN Agbata, Tochukwu, MD      . remdesivir 100 mg in sodium chloride 0.9 % 100 mL IVPB  100 mg  Intravenous Daily Rauer, Robyne Peers, RPH   Stopped at 04/06/20 0924  . senna (SENOKOT) tablet 8.6 mg  1 tablet Oral Daily Agbata, Tochukwu, MD   8.6 mg at 04/06/20 2029  . simvastatin (ZOCOR) tablet 20 mg  20 mg Oral QHS Agbata, Tochukwu, MD   20 mg at 04/06/20 2029  . sodium chloride flush (NS) 0.9 % injection 3 mL  3 mL Intravenous Q12H Agbata, Tochukwu, MD   3 mL at 04/06/20 2030  . sodium chloride flush (NS) 0.9 % injection 3 mL  3 mL Intravenous PRN Agbata, Tochukwu, MD      . tamsulosin (FLOMAX) capsule 0.4 mg  0.4 mg Oral Daily Agbata, Tochukwu, MD   0.4 mg at 04/06/20 0836  . traZODone (DESYREL) tablet 50 mg  50 mg Oral QHS Agbata, Tochukwu, MD   50 mg at 04/06/20 2029  . Vitamin D (Ergocalciferol) (DRISDOL) capsule 50,000 Units  50,000 Units Oral Daily Agbata, Tochukwu, MD   50,000 Units at 04/06/20 0837  . zinc sulfate capsule 220 mg  220 mg Oral Daily Agbata, Tochukwu, MD   220 mg at 04/06/20 0737     Discharge Medications: Please see discharge summary for a list of discharge medications.  Relevant Imaging Results:  Relevant Lab Results:   Additional Information SS#: [id number]  Tyeasha Ebbs E Yuepheng Schaller, LCSW

## 2020-04-07 NOTE — Progress Notes (Addendum)
PROGRESS NOTE  Paul Bryan CBJ:[id number] DOB: 1929-10-21 DOA: 04/03/2020 PCP: Kandyce Rud, MD   LOS: 4 days   Brief narrative: As per HPI,  Paul Bryan is a 86 y.o. male  with past medical history of dementia and hypertension was brought into the hospital from skilled nursing facility for evaluation of weakness.  Of note, patient tested positive for the COVID-19 virus on 11/23.  He is unvaccinated.    He was reported to have nonproductive cough with shortness of breath was found on the EMS was called in when he was noted to have a pulse ox of 88-89% on room air.  Patient was given supplemental oxygen with improvement to the high 90s on 2 L.  Initial laboratory data was notable for creatinine of 2.2 above baseline of 1.7.  Lactate was within normal limits.  WBC within normal limits.  Chest x-ray showed bibasilar opacities.  In the ED, patient received remdesivir and Solu-Medrol and was considered for admission to the hospital.  Assessment/Plan:  Principal Problem:   Pneumonia due to COVID-19 virus Active Problems:   Hypertension   CKD (chronic kidney disease), stage III (HCC)   Dehydration   BPH (benign prostatic hyperplasia)   Acute respiratory failure (HCC)  Acute hypoxic respiratory failure secondary to COVID-19 pneumonia  Patient tested positive for Covid virus on 04/01/20 and on 04/05/20.  Continue remdesivir, IV dexamethasone, supportive care with antitussives and bronchodilators.  Currently on room air .  Patient should be stable for discharge to skilled nursing facility with oral prednisone on discharge.  COVID-19 Labs  Recent Labs    04/05/20 0538 04/06/20 0447 04/07/20 0556 04/07/20 0557  DDIMER  --   --   --  0.56*  FERRITIN 93 92  --  83  CRP 7.2* 15.5* 11.2*  --     Lab Results  Component Value Date   SARSCOV2NAA POSITIVE (A) 04/05/2020   SARSCOV2NAA NEGATIVE 02/07/2019   SARSCOV2NAA NEGATIVE 02/04/2019   D-dimer elevated at 1023>937.  Ferritin  93>92  Dementia Continue galantamine, as needed Ativan, trazodone and Seroquel. Secondary school teacher at bedside.  Will need frequent, reorientation and guidance.    Hypertension Continue amlodipine, improved.  Acute kidney injury on stage IIIa chronic kidney disease Likely secondary to volume depletion and diuretics.  Creatinine today is 1.8.,  Baseline serum creatinine of 1.74 and  on admission it was 2.21.   Medically stable at this time.  BPH Continue Flomax p.o.  DVT prophylaxis: enoxaparin (LOVENOX) injection 30 mg Start: 04/03/20 2200   Code Status:  DNR  Family Communication: Spoke with the patient's son Mr. [name] on the phone and updated him about the clinical condition of the patient.  Status is: Inpatient  Remains inpatient appropriate because:IV treatments appropriate due to intensity of illness or inability to take PO, Inpatient level of care appropriate due to severity of illness and Covid pneumonia ,needs SNF placement  Dispo: The patient is from: Assisted living facility              Anticipated d/c is to: SNF as per physical therapy recommendation.              Anticipated d/c date is: Whenever a bed is available.  At this time it looks like patient might not have a bed available soon because of Covid positive status and need for a skilled nursing facility placement.  Spoke with transition of care regarding disposition.  Patient currently is  medically stable to d/c.  Consultants:  None  Procedures:  None  Antibiotics:  . Remdesivir 11/25>  Subjective:  Today, patient was seen and examined at bedside.  Patient denies any nausea vomiting shortness of breath.  Sitter at bedside reported some cough.  Does not appear to be agitated but is confused at times. . Objective: Vitals:   04/07/20 0719 04/07/20 1244  BP: (!) 142/76 138/70  Pulse: 73 74  Resp: 20 19  Temp: 98.1 F (36.7 C) (!) 97.4 F (36.3 C)  SpO2: 91% 92%    Intake/Output  Summary (Last 24 hours) at 04/07/2020 1339 Last data filed at 04/07/2020 1241 Gross per 24 hour  Intake 300 ml  Output 1025 ml  Net -725 ml   Filed Weights   04/03/20 0722  Weight: 72.6 kg   Body mass index is 24.33 kg/m.   Physical Exam: General:  Average built, not in obvious distress, on room air, communicative, HENT:   No scleral pallor or icterus noted. Oral mucosa is moist.  Chest: Coarse breath sounds noted, no wheezes noted CVS: S1 &S2 heard. No murmur.  Regular rate and rhythm. Abdomen: Soft, nontender, nondistended.  Bowel sounds are heard.   Extremities: No cyanosis, clubbing or edema.  Peripheral pulses are palpable. Psych: Alert, awake and disoriented, communicative, sitter at bedside CNS:    Moving all extremities, alert awake communicative Skin: Warm and dry.  No rashes noted.  Data Review: I have personally reviewed the following laboratory data and studies,  CBC: Recent Labs  Lab 04/03/20 0726 04/04/20 0544 04/05/20 0538 04/06/20 0447 04/07/20 0557  WBC 6.4 6.9 10.7* 7.8 12.9*  NEUTROABS 4.1 5.2 7.4 6.3 10.0*  HGB 12.4* 12.8* 12.8* 13.1 12.2*  HCT 38.1* 37.5* 38.5* 38.6* 38.7*  MCV 82.6 79.6* 80.2 79.6* 84.7  PLT 249 274 324 341 344   Basic Metabolic Panel: Recent Labs  Lab 04/03/20 0726 04/04/20 0544 04/05/20 0538 04/06/20 0447 04/07/20 0557  NA 135 138 139 139 135  K 3.7 3.6 3.8 4.3 3.9  CL 99 103 104 103 102  CO2 24 20* 23 21* 22  GLUCOSE 107* 123* 84 130* 102*  BUN 27* 31* 36* 46* 52*  CREATININE 2.21* 1.73* 1.70* 1.94* 1.83*  CALCIUM 8.4* 8.3* 8.8* 8.8* 8.3*  MG  --  2.1 1.8 1.9 1.9  PHOS  --  4.0 2.9 5.1* 3.4   Liver Function Tests: Recent Labs  Lab 04/03/20 0726 04/04/20 0544 04/05/20 0538 04/06/20 0447 04/07/20 0557  AST 36 56* 73* 81* 60*  ALT 25 31 37 54* 43  ALKPHOS 48 49 52 54 50  BILITOT 0.6 0.3 0.6 0.5 0.6  PROT 6.9 6.7 6.6 6.8 6.4*  ALBUMIN 3.3* 3.1* 3.2* 3.1* 2.9*   No results for input(s): LIPASE, AMYLASE in  the last 168 hours. No results for input(s): AMMONIA in the last 168 hours. Cardiac Enzymes: No results for input(s): CKTOTAL, CKMB, CKMBINDEX, TROPONINI in the last 168 hours. BNP (last 3 results) Recent Labs    03/14/20 1929 04/03/20 0726  BNP 43.8 105.5*    ProBNP (last 3 results) No results for input(s): PROBNP in the last 8760 hours.  CBG: Recent Labs  Lab 04/06/20 0808  GLUCAP 120*   Recent Results (from the past 240 hour(s))  Blood culture (single)     Status: None (Preliminary result)   Collection Time: 04/03/20  7:27 AM   Specimen: BLOOD  Result Value Ref Range Status  Specimen Description BLOOD RIGHT [facility]  Final   Special Requests   Final    BOTTLES DRAWN AEROBIC AND ANAEROBIC Blood Culture results may not be optimal due to an excessive volume of blood received in culture bottles   Culture   Final    NO GROWTH 4 DAYS Performed at [facility], [address], [place], [place] [address]    Report Status PENDING  Incomplete  Resp Panel by RT-PCR (Flu A&B, Covid) Nasopharyngeal Swab     Status: Abnormal   Collection Time: 04/05/20  9:08 AM   Specimen: Nasopharyngeal Swab; Nasopharyngeal(NP) swabs in vial transport medium  Result Value Ref Range Status   SARS Coronavirus 2 by RT PCR POSITIVE (A) NEGATIVE Final    Comment: RESULT CALLED TO, READ BACK BY AND VERIFIED WITH: EMILY RUIZ 04/05/20 AT 1202 BY ACR (NOTE) SARS-CoV-2 target nucleic acids are DETECTED.  The SARS-CoV-2 RNA is generally detectable in upper respiratory specimens during the acute phase of infection. Positive results are indicative of the presence of the identified virus, but do not rule out bacterial infection or co-infection with other pathogens not detected by the test. Clinical correlation with patient history and other diagnostic information is necessary to determine patient infection status. The expected result is Negative.  Fact Sheet for  Patients: BloggerCourse.com  Fact Sheet for Healthcare Providers: SeriousBroker.it  This test is not yet approved or cleared by the [place] FDA and  has been authorized for detection and/or diagnosis of SARS-CoV-2 by FDA under an Emergency Use Authorization (EUA).  This EUA will remain in effect (meaning this test can b e used) for the duration of  the COVID-19 declaration under Section 564(b)(1) of the Act, 21 U.S.C. section 360bbb-3(b)(1), unless the authorization is terminated or revoked sooner.     Influenza A by PCR NEGATIVE NEGATIVE Final   Influenza B by PCR NEGATIVE NEGATIVE Final    Comment: (NOTE) The Xpert Xpress SARS-CoV-2/FLU/RSV plus assay is intended as an aid in the diagnosis of influenza from Nasopharyngeal swab specimens and should not be used as a sole basis for treatment. Nasal washings and aspirates are unacceptable for Xpert Xpress SARS-CoV-2/FLU/RSV testing.  Fact Sheet for Patients: BloggerCourse.com  Fact Sheet for Healthcare Providers: SeriousBroker.it  This test is not yet approved or cleared by the [place] FDA and has been authorized for detection and/or diagnosis of SARS-CoV-2 by FDA under an Emergency Use Authorization (EUA). This EUA will remain in effect (meaning this test can be used) for the duration of the COVID-19 declaration under Section 564(b)(1) of the Act, 21 U.S.C. section 360bbb-3(b)(1), unless the authorization is terminated or revoked.  Performed at [facility], [address] [place], [place] [address]      Studies: No results found.    Joycelyn Das, MD  [facility] 04/07/2020

## 2020-04-07 NOTE — Progress Notes (Signed)
Paged MD to please renew sitter order,  Pt extremely restless, pulled out IV, unable to follow direction.  Bed alarm is on, bed in lowest position.

## 2020-04-07 NOTE — Progress Notes (Signed)
Patient disoriented. Attempted call to son and daughter in law's phones for Important Message from Medicare. Left a voicemail.  Alfonso Ramus, [place] [phone]

## 2020-04-07 NOTE — TOC Initial Note (Addendum)
Transition of Care [facility]) - Initial/Assessment Note    Patient Details  Name: Paul Bryan MRN: [id number] Date of Birth: October 31, 1929  Transition of [facility]) CM/SW Contact:    Liliana Cline, LCSW Phone Number: 04/07/2020, 10:21 AM  Clinical Narrative:     Per chart review, patient is disoriented. CSW spoke with patient's son Paul Bryan via phone for Readmission Screen to obtain information. Paul Bryan reported patient is from [facility] where he's a resident in their Memory Care Unit. PCP is Dr. Larwance Sachs. ALF or family provide transportation and ALF provides medicines. No DME or SNF history. Patient has had Home Health OT at [facility]. Paul Bryan asked CSW to add his 2 children and wife as back up contacts for patients, family added to chart. CSW informed Paul Bryan of PT/OT recommendation for SNF rehab at discharge. Family is agreeable to SNF rehab, no SNF preference. CSW will start SNF work up then follow up with bed offers. Paul Bryan reported patient does not have his COVID vaccines.  10:50- Call to daughter in law Paul Bryan for address for [facility], also informed her of it being likely that patient would have to stay at hospital until 10 days from positive COVID test. She verbalized understanding. She reported patient does not have any disruptive or combative behaviors and is very kind and helpful at his ALF. She reported patient does wander at times.   Expected Discharge Plan: Skilled Nursing Facility Barriers to Discharge: Continued Medical Work up   Patient Goals and CMS Choice Patient states their goals for this hospitalization and ongoing recovery are:: SNF rehab CMS Medicare.gov Compare Post Acute Care list provided to:: Patient Represenative (must comment) Choice offered to / list presented to : Adult Children  Expected Discharge Plan and Services Expected Discharge Plan: Skilled Nursing Facility       Living arrangements for the past 2 months: Assisted Living Facility                                       Prior Living Arrangements/Services Living arrangements for the past 2 months: Assisted Living Facility Lives with:: Facility Resident Patient language and need for interpreter reviewed:: Yes Do you feel safe going back to the place where you live?: Yes      Need for Family Participation in Patient Care: Yes (Comment) Care giver support system in place?: Yes (comment) Current home services: Home OT Criminal Activity/Legal Involvement Pertinent to Current Situation/Hospitalization: No - Comment as needed  Activities of Daily Living Home Assistive Devices/Equipment:  (unknown ) ADL Screening (condition at time of admission) Patient's cognitive ability adequate to safely complete daily activities?: No Is the patient deaf or have difficulty hearing?: No Does the patient have difficulty seeing, even when wearing glasses/contacts?: No Does the patient have difficulty concentrating, remembering, or making decisions?: No Patient able to express need for assistance with ADLs?: Yes Does the patient have difficulty dressing or bathing?: Yes Independently performs ADLs?: No Communication: Independent Dressing (OT): Needs assistance Is this a change from baseline?: Change from baseline, expected to last >3 days Grooming: Independent Feeding: Independent Bathing: Needs assistance Is this a change from baseline?: Change from baseline, expected to last >3 days Toileting: Needs assistance Is this a change from baseline?: Change from baseline, expected to last >3days In/Out Bed: Needs assistance Is this a change from baseline?: Change from baseline, expected to last >3 days Walks in Home: Needs  assistance Is this a change from baseline?: Change from baseline, expected to last >3 days Does the patient have difficulty walking or climbing stairs?: Yes Weakness of Legs: None Weakness of Arms/Hands: None  Permission Sought/Granted Permission sought to share information with  : Oceanographer granted to share information with : Yes, Verbal Permission Granted (by son)     Permission granted to share info w AGENCY: SNFs, ALF [facility]        Emotional Assessment   Attitude/Demeanor/Rapport: Unable to Assess   Orientation: : Fluctuating Orientation (Suspected and/or reported Sundowners) Alcohol / Substance Use: Not Applicable Psych Involvement: No (comment)  Admission diagnosis:  Acute respiratory failure with hypoxia (HCC) [J96.01] Pneumonia due to COVID-19 virus [U07.1, J12.82] COVID-19 [U07.1] Patient Active Problem List   Diagnosis Date Noted  . Pneumonia due to COVID-19 virus 04/03/2020  . Hypertension   . CKD (chronic kidney disease), stage III (HCC)   . Dehydration   . BPH (benign prostatic hyperplasia)   . Acute respiratory failure (HCC)   . Altered mental status 02/05/2019  . GIB (gastrointestinal bleeding) 10/09/2017   PCP:  Kandyce Rud, MD Pharmacy:   [facility] [phone] [address], [place] - [address] [place] [address] [place] [address] Phone: [phone] Fax: [phone]     Social Determinants of Health (SDOH) Interventions    Readmission Risk Interventions Readmission Risk Prevention Plan 04/07/2020  Transportation Screening Complete  PCP or Specialist Appt within 3-5 Days Complete  HRI or Home Care Consult Complete  Social Work Consult for Recovery Care Planning/Counseling Complete  Palliative Care Screening Not Applicable  Medication Review Oceanographer) Complete  Some recent data might be hidden

## 2020-04-08 DIAGNOSIS — J9600 Acute respiratory failure, unspecified whether with hypoxia or hypercapnia: Secondary | ICD-10-CM | POA: Diagnosis not present

## 2020-04-08 DIAGNOSIS — U071 COVID-19: Secondary | ICD-10-CM | POA: Diagnosis not present

## 2020-04-08 DIAGNOSIS — N183 Chronic kidney disease, stage 3 unspecified: Secondary | ICD-10-CM | POA: Diagnosis not present

## 2020-04-08 DIAGNOSIS — N400 Enlarged prostate without lower urinary tract symptoms: Secondary | ICD-10-CM | POA: Diagnosis not present

## 2020-04-08 LAB — CBC WITH DIFFERENTIAL/PLATELET
Abs Immature Granulocytes: 0.27 10*3/uL — ABNORMAL HIGH (ref 0.00–0.07)
Basophils Absolute: 0 10*3/uL (ref 0.0–0.1)
Basophils Relative: 0 %
Eosinophils Absolute: 0 10*3/uL (ref 0.0–0.5)
Eosinophils Relative: 0 %
HCT: 34.1 % — ABNORMAL LOW (ref 39.0–52.0)
Hemoglobin: 11.7 g/dL — ABNORMAL LOW (ref 13.0–17.0)
Immature Granulocytes: 2 %
Lymphocytes Relative: 8 %
Lymphs Abs: 0.9 10*3/uL (ref 0.7–4.0)
MCH: 27.1 pg (ref 26.0–34.0)
MCHC: 34.3 g/dL (ref 30.0–36.0)
MCV: 78.9 fL — ABNORMAL LOW (ref 80.0–100.0)
Monocytes Absolute: 1.5 10*3/uL — ABNORMAL HIGH (ref 0.1–1.0)
Monocytes Relative: 13 %
Neutro Abs: 9 10*3/uL — ABNORMAL HIGH (ref 1.7–7.7)
Neutrophils Relative %: 77 %
Platelets: 390 10*3/uL (ref 150–400)
RBC: 4.32 MIL/uL (ref 4.22–5.81)
RDW: 14.9 % (ref 11.5–15.5)
WBC: 11.8 10*3/uL — ABNORMAL HIGH (ref 4.0–10.5)
nRBC: 0.3 % — ABNORMAL HIGH (ref 0.0–0.2)

## 2020-04-08 LAB — CULTURE, BLOOD (SINGLE): Culture: NO GROWTH

## 2020-04-08 LAB — COMPREHENSIVE METABOLIC PANEL
ALT: 41 U/L (ref 0–44)
AST: 52 U/L — ABNORMAL HIGH (ref 15–41)
Albumin: 2.8 g/dL — ABNORMAL LOW (ref 3.5–5.0)
Alkaline Phosphatase: 50 U/L (ref 38–126)
Anion gap: 12 (ref 5–15)
BUN: 42 mg/dL — ABNORMAL HIGH (ref 8–23)
CO2: 21 mmol/L — ABNORMAL LOW (ref 22–32)
Calcium: 8.3 mg/dL — ABNORMAL LOW (ref 8.9–10.3)
Chloride: 97 mmol/L — ABNORMAL LOW (ref 98–111)
Creatinine, Ser: 1.46 mg/dL — ABNORMAL HIGH (ref 0.61–1.24)
GFR, Estimated: 46 mL/min — ABNORMAL LOW (ref >60)
Glucose, Bld: 111 mg/dL — ABNORMAL HIGH (ref 70–99)
Potassium: 3.9 mmol/L (ref 3.5–5.1)
Sodium: 130 mmol/L — ABNORMAL LOW (ref 135–145)
Total Bilirubin: 0.8 mg/dL (ref 0.3–1.2)
Total Protein: 6.1 g/dL — ABNORMAL LOW (ref 6.5–8.1)

## 2020-04-08 LAB — FERRITIN: Ferritin: 86 ng/mL (ref 24–336)

## 2020-04-08 LAB — C-REACTIVE PROTEIN: CRP: 14.2 mg/dL — ABNORMAL HIGH (ref <1.0)

## 2020-04-08 LAB — D-DIMER, QUANTITATIVE: D-Dimer, Quant: 0.84 ug/mL-FEU — ABNORMAL HIGH (ref 0.00–0.50)

## 2020-04-08 LAB — PHOSPHORUS: Phosphorus: 3.6 mg/dL (ref 2.5–4.6)

## 2020-04-08 LAB — MAGNESIUM: Magnesium: 2.2 mg/dL (ref 1.7–2.4)

## 2020-04-08 MED ORDER — DEXTROMETHORPHAN POLISTIREX ER 30 MG/5ML PO SUER
60.0000 mg | Freq: Every evening | ORAL | Status: DC | PRN
  Administered 2020-04-08: 60 mg via ORAL
  Filled 2020-04-08 (×2): qty 10

## 2020-04-08 MED ORDER — ENOXAPARIN SODIUM 40 MG/0.4ML ~~LOC~~ SOLN
40.0000 mg | SUBCUTANEOUS | Status: DC
  Administered 2020-04-08 – 2020-04-10 (×3): 40 mg via SUBCUTANEOUS
  Filled 2020-04-08 (×3): qty 0.4

## 2020-04-08 NOTE — Progress Notes (Addendum)
PROGRESS NOTE  Paul Bryan JOI:[id number] DOB: 07/20/1929 DOA: 04/03/2020 PCP: Kandyce Rud, MD   LOS: 5 days   Brief narrative: Paul Bryan is a 86 y.o. male  with past medical history of dementia and hypertension was brought into the hospital from skilled nursing facility for evaluation of weakness.  Of note, patient tested positive for the COVID-19 virus on 11/23.  He is unvaccinated.    He was reported to have nonproductive cough with shortness of breath and  EMS was called in when he was noted to hypoxic with  pulse ox of 88-89% on room air.  Patient was given supplemental oxygen with improvement to the high 90s on 2 L.  Initial laboratory data was notable for creatinine of 2.2 above baseline of 1.7.  Lactate was within normal limits.  WBC was within normal limits.  Chest x-ray showed bibasilar opacities.  In the ED, patient received remdesivir and Solu-Medrol and was considered for admission to the hospital.  Assessment/Plan:  Principal Problem:   Pneumonia due to COVID-19 virus Active Problems:   Hypertension   CKD (chronic kidney disease), stage III (HCC)   Dehydration   BPH (benign prostatic hyperplasia)   Acute respiratory failure (HCC)  Acute hypoxic respiratory failure secondary to COVID-19 pneumonia Patient tested positive for Covid virus on 04/01/20 and on 04/05/20.  Completed course of remdesivir.  On steroids , continue supportive  care with antitussives and bronchodilators.  Currently on room air .  Patient should be stable for discharge to skilled nursing facility with oral prednisone on discharge if a bed is available.  COVID-19 Labs   Recent Labs    04/06/20 0447 04/07/20 0556 04/07/20 0557 04/08/20 0720  DDIMER  --   --  0.56*  --   FERRITIN 92  --  83 86  CRP 15.5* 11.2*  --   --     Lab Results  Component Value Date   SARSCOV2NAA POSITIVE (A) 04/05/2020   SARSCOV2NAA NEGATIVE 02/07/2019   SARSCOV2NAA NEGATIVE 02/04/2019   D-dimer elevated at  1023>937.  Ferritin 93>92>86  Dementia Continue galantamine, as needed Ativan, trazodone and Seroquel. Secondary school teacher at bedside.  Will need frequent reorientation and guidance..  Patient has baseline agitation.  Hypertension  Continue amlodipine, improved.  Acute kidney injury on stage IIIa chronic kidney disease Likely secondary to volume depletion and diuretics.  Creatinine today is 1.4.,  Baseline serum creatinine of 1.7 and  on admission it was 2.21.   Medically stable at this time.  BPH Continue Flomax p.o.  Mild hyponatremia noted today.  Continue to monitor closely.  DVT prophylaxis: enoxaparin (LOVENOX) injection 30 mg Start: 04/03/20 2200   Code Status:  DNR  Family Communication: None today. Spoke with the patient's son Mr. [name] on the phone and updated him about the clinical condition of the patient yesterday.  Status is: Inpatient  Remains inpatient appropriate because: Treatment for Covid pneumonia ,needs SNF placement  Dispo: The patient is from: Assisted living facility              Anticipated d/c is to: SNF as per physical therapy recommendation.               Anticipated d/c date is: Whenever a bed is available.  At this time it looks like patient might not have a bed available soon because of Covid positive status and need for a skilled nursing facility placement.  Spoke at the interdisciplinary meeting  Patient currently is  medically stable to d/c.  Consultants:  None  Procedures:  None  Antibiotics:  . Remdesivir 11/25> completed course  Subjective:  Today, patient was seen and examined at bedside.  Patient stable as per the sitter at bedside.  He denies any chest pain, shortness of breath or fever but has some cough as per the sitter.  Appears to be intermittently confused.    Objective: Vitals:   04/08/20 0357 04/08/20 0747  BP: 114/68 138/69  Pulse: 64 63  Resp: 16 20  Temp: 98 F (36.7 C) 98 F (36.7 C)  SpO2:  94% 92%    Intake/Output Summary (Last 24 hours) at 04/08/2020 0904 Last data filed at 04/08/2020 0800 Gross per 24 hour  Intake --  Output 1500 ml  Net -1500 ml   Filed Weights   04/03/20 0722  Weight: 72.6 kg   Body mass index is 24.33 kg/m.   Physical Exam: General:  Average built, not in obvious distress, alert awake and communicative, HENT:   No scleral pallor or icterus noted. Oral mucosa is moist.  Chest: Coarse breath sounds noted, breath sounds heard bilaterally..  CVS: S1 &S2 heard. No murmur.  Regular rate and rhythm. Abdomen: Soft, nontender, nondistended.  Bowel sounds are heard.   Extremities: No cyanosis, clubbing Peripheral pulses are palpable. Psych: Alert, awake and disoriented, communicative, CNS:  No cranial nerve deficits.  Power equal in all extremities.   Skin: Warm and dry.  No rashes noted.  Data Review: I have personally reviewed the following laboratory data and studies,  CBC: Recent Labs  Lab 04/04/20 0544 04/05/20 0538 04/06/20 0447 04/07/20 0557 04/08/20 0720  WBC 6.9 10.7* 7.8 12.9* 11.8*  NEUTROABS 5.2 7.4 6.3 10.0* 9.0*  HGB 12.8* 12.8* 13.1 12.2* 11.7*  HCT 37.5* 38.5* 38.6* 38.7* 34.1*  MCV 79.6* 80.2 79.6* 84.7 78.9*  PLT 274 324 341 344 390   Basic Metabolic Panel: Recent Labs  Lab 04/04/20 0544 04/05/20 0538 04/06/20 0447 04/07/20 0557 04/08/20 0720  NA 138 139 139 135 130*  K 3.6 3.8 4.3 3.9 3.9  CL 103 104 103 102 97*  CO2 20* 23 21* 22 21*  GLUCOSE 123* 84 130* 102* 111*  BUN 31* 36* 46* 52* 42*  CREATININE 1.73* 1.70* 1.94* 1.83* 1.46*  CALCIUM 8.3* 8.8* 8.8* 8.3* 8.3*  MG 2.1 1.8 1.9 1.9 2.2  PHOS 4.0 2.9 5.1* 3.4 3.6   Liver Function Tests: Recent Labs  Lab 04/04/20 0544 04/05/20 0538 04/06/20 0447 04/07/20 0557 04/08/20 0720  AST 56* 73* 81* 60* 52*  ALT 31 37 54* 43 41  ALKPHOS 49 52 54 50 50  BILITOT 0.3 0.6 0.5 0.6 0.8  PROT 6.7 6.6 6.8 6.4* 6.1*  ALBUMIN 3.1* 3.2* 3.1* 2.9* 2.8*   No  results for input(s): LIPASE, AMYLASE in the last 168 hours. No results for input(s): AMMONIA in the last 168 hours. Cardiac Enzymes: No results for input(s): CKTOTAL, CKMB, CKMBINDEX, TROPONINI in the last 168 hours. BNP (last 3 results) Recent Labs    03/14/20 1929 04/03/20 0726  BNP 43.8 105.5*    ProBNP (last 3 results) No results for input(s): PROBNP in the last 8760 hours.  CBG: Recent Labs  Lab 04/06/20 0808  GLUCAP 120*   Recent Results (from the past 240 hour(s))  Blood culture (single)     Status: None   Collection Time: 04/03/20  7:27 AM   Specimen: BLOOD  Result Value Ref Range Status  Specimen Description BLOOD RIGHT [facility]  Final   Special Requests   Final    BOTTLES DRAWN AEROBIC AND ANAEROBIC Blood Culture results may not be optimal due to an excessive volume of blood received in culture bottles   Culture   Final    NO GROWTH 5 DAYS Performed at [facility], [address] [place], [place] [address]    Report Status 04/08/2020 FINAL  Final  Resp Panel by RT-PCR (Flu A&B, Covid) Nasopharyngeal Swab     Status: Abnormal   Collection Time: 04/05/20  9:08 AM   Specimen: Nasopharyngeal Swab; Nasopharyngeal(NP) swabs in vial transport medium  Result Value Ref Range Status   SARS Coronavirus 2 by RT PCR POSITIVE (A) NEGATIVE Final    Comment: RESULT CALLED TO, READ BACK BY AND VERIFIED WITH: EMILY RUIZ 04/05/20 AT 1202 BY ACR (NOTE) SARS-CoV-2 target nucleic acids are DETECTED.  The SARS-CoV-2 RNA is generally detectable in upper respiratory specimens during the acute phase of infection. Positive results are indicative of the presence of the identified virus, but do not rule out bacterial infection or co-infection with other pathogens not detected by the test. Clinical correlation with patient history and other diagnostic information is necessary to determine patient infection status. The expected result is Negative.  Fact Sheet for  Patients: BloggerCourse.com  Fact Sheet for Healthcare Providers: SeriousBroker.it  This test is not yet approved or cleared by the [place] FDA and  has been authorized for detection and/or diagnosis of SARS-CoV-2 by FDA under an Emergency Use Authorization (EUA).  This EUA will remain in effect (meaning this test can b e used) for the duration of  the COVID-19 declaration under Section 564(b)(1) of the Act, 21 U.S.C. section 360bbb-3(b)(1), unless the authorization is terminated or revoked sooner.     Influenza A by PCR NEGATIVE NEGATIVE Final   Influenza B by PCR NEGATIVE NEGATIVE Final    Comment: (NOTE) The Xpert Xpress SARS-CoV-2/FLU/RSV plus assay is intended as an aid in the diagnosis of influenza from Nasopharyngeal swab specimens and should not be used as a sole basis for treatment. Nasal washings and aspirates are unacceptable for Xpert Xpress SARS-CoV-2/FLU/RSV testing.  Fact Sheet for Patients: BloggerCourse.com  Fact Sheet for Healthcare Providers: SeriousBroker.it  This test is not yet approved or cleared by the [place] FDA and has been authorized for detection and/or diagnosis of SARS-CoV-2 by FDA under an Emergency Use Authorization (EUA). This EUA will remain in effect (meaning this test can be used) for the duration of the COVID-19 declaration under Section 564(b)(1) of the Act, 21 U.S.C. section 360bbb-3(b)(1), unless the authorization is terminated or revoked.  Performed at [facility], [address], [place], [place] [address]      Studies: No results found.    Joycelyn Das, MD  [facility] 04/08/2020

## 2020-04-08 NOTE — Care Management Important Message (Signed)
Important Message  Patient Details  Name: Paul Bryan MRN: [id number] Date of Birth: Mar 27, 1930   Medicare Important Message Given:  Yes  IM given to son [name] via phone due to patient's Dementia and being on Airborne Precautions. Son verbalized understanding.   Ewin Rehberg E Marshell Dilauro, LCSW 04/08/2020, 10:26 AM

## 2020-04-08 NOTE — Progress Notes (Signed)
Patient placed on low bed per protocol. 1:1 sitter remains. Call light and phone within reach.

## 2020-04-08 NOTE — TOC Progression Note (Addendum)
Transition of Care [facility]) - Progression Note    Patient Details  Name: KADIR AZUCENA MRN: [id number] Date of Birth: 1930/02/02  Transition of Care [facility]) CM/SW Contact  Liliana Cline, LCSW Phone Number: 04/08/2020, 10:27 AM  Clinical Narrative:   Spoke to patient's son. He verbalized understanding that patient would go to SNF for rehab after 10 day quarantine, positive COVID test was 11/27.    Expected Discharge Plan: Skilled Nursing Facility Barriers to Discharge: Continued Medical Work up  Expected Discharge Plan and Services Expected Discharge Plan: Skilled Nursing Facility       Living arrangements for the past 2 months: Assisted Living Facility                                       Social Determinants of Health (SDOH) Interventions    Readmission Risk Interventions Readmission Risk Prevention Plan 04/07/2020  Transportation Screening Complete  PCP or Specialist Appt within 3-5 Days Complete  HRI or Home Care Consult Complete  Social Work Consult for Recovery Care Planning/Counseling Complete  Palliative Care Screening Not Applicable  Medication Review Oceanographer) Complete  Some recent data might be hidden

## 2020-04-08 NOTE — Progress Notes (Signed)
PHARMACIST - PHYSICIAN COMMUNICATION  CONCERNING:  Enoxaparin (Lovenox) for DVT Prophylaxis    RECOMMENDATION: Patient was prescribed enoxaparin 30 mg q24 hours for VTE prophylaxis.   Filed Weights   04/03/20 0722 04/08/20 1116  Weight: 72.6 kg (160 lb) 80.1 kg (176 lb 9.4 oz)    Body mass index is 26.85 kg/m.  Estimated Creatinine Clearance: 33.8 mL/min (A) (by C-G formula based on SCr of 1.46 mg/dL (H)).   Based on [facility] policy patient is candidate for enoxaparin 40 mg every 24 hours based on CrCl > 26ml/min  DESCRIPTION: Pharmacy has adjusted enoxaparin dose per [facility] policy.  Patient is now receiving enoxaparin 40 mg every 24 hours    Pricilla Riffle, PharmD Clinical Pharmacist  04/08/2020 6:13 PM

## 2020-04-09 ENCOUNTER — Inpatient Hospital Stay: Payer: Medicare Other

## 2020-04-09 DIAGNOSIS — J1282 Pneumonia due to coronavirus disease 2019: Secondary | ICD-10-CM | POA: Diagnosis not present

## 2020-04-09 DIAGNOSIS — U071 COVID-19: Secondary | ICD-10-CM | POA: Diagnosis not present

## 2020-04-09 LAB — COMPREHENSIVE METABOLIC PANEL
ALT: 38 U/L (ref 0–44)
AST: 48 U/L — ABNORMAL HIGH (ref 15–41)
Albumin: 2.8 g/dL — ABNORMAL LOW (ref 3.5–5.0)
Alkaline Phosphatase: 54 U/L (ref 38–126)
Anion gap: 10 (ref 5–15)
BUN: 35 mg/dL — ABNORMAL HIGH (ref 8–23)
CO2: 22 mmol/L (ref 22–32)
Calcium: 8.2 mg/dL — ABNORMAL LOW (ref 8.9–10.3)
Chloride: 99 mmol/L (ref 98–111)
Creatinine, Ser: 1.27 mg/dL — ABNORMAL HIGH (ref 0.61–1.24)
GFR, Estimated: 54 mL/min — ABNORMAL LOW (ref >60)
Glucose, Bld: 107 mg/dL — ABNORMAL HIGH (ref 70–99)
Potassium: 3.8 mmol/L (ref 3.5–5.1)
Sodium: 131 mmol/L — ABNORMAL LOW (ref 135–145)
Total Bilirubin: 0.9 mg/dL (ref 0.3–1.2)
Total Protein: 6.2 g/dL — ABNORMAL LOW (ref 6.5–8.1)

## 2020-04-09 LAB — MAGNESIUM: Magnesium: 2 mg/dL (ref 1.7–2.4)

## 2020-04-09 MED ORDER — PANTOPRAZOLE SODIUM 40 MG PO TBEC
40.0000 mg | DELAYED_RELEASE_TABLET | Freq: Every day | ORAL | Status: DC
  Filled 2020-04-09: qty 1

## 2020-04-09 MED ORDER — HYDROCOD POLST-CPM POLST ER 10-8 MG/5ML PO SUER
5.0000 mL | Freq: Two times a day (BID) | ORAL | Status: DC | PRN
  Administered 2020-04-09 – 2020-04-10 (×2): 5 mL via ORAL
  Filled 2020-04-09 (×2): qty 5

## 2020-04-09 MED ORDER — GUAIFENESIN-DM 100-10 MG/5ML PO SYRP
5.0000 mL | ORAL_SOLUTION | ORAL | Status: DC | PRN
  Administered 2020-04-09: 5 mL via ORAL
  Filled 2020-04-09: qty 5

## 2020-04-09 MED ORDER — DEXAMETHASONE 4 MG PO TABS
6.0000 mg | ORAL_TABLET | ORAL | Status: DC
  Administered 2020-04-09 – 2020-04-11 (×3): 6 mg via ORAL
  Filled 2020-04-09 (×3): qty 2

## 2020-04-09 NOTE — TOC Progression Note (Addendum)
Transition of Care [facility]) - Progression Note    Patient Details  Name: COLTER MAGOWAN MRN: [id number] Date of Birth: 10-03-1929  Transition of Care [facility]) CM/SW [place], LCSW Phone Number: 04/09/2020, 11:11 AM  Clinical Narrative:   CSW called [facility], spoke to [place]. Requested copy of COVID test they did on patient so that CSW can possibly get patient to SNF rehab sooner if SNF will accept that date. She reported she will fax a copy of COVID test that was done at the ALF.  3:30- Received copy of COVID test from [facility] on 11/23. CSW called SNFs with pending bed offers. Explained that patient is from [place] and has a history of wandering but is pleasant and cooperative per family reports. Inquired if they can accept patient and if they can accept the 11/23 COVID test or have to go based off of the one done here at the hospital.  [place] [facility] reported they'd have to use the hospital COVID test, but cannot take him due to wandering. [facility]- Gerald Stabs reported they can accept the 11/23 test done at ALF. He reported they do have a wander guard, but if patient is currently a wanderer, he may need a secure unit. Asked RN and MD if patient has displayed any trying to wander here at the hospital/if they feel the wanderguard would be sufficient. Per RN, patient has a sitter due to risk of wandering. Will need a secure unit per MD. [facility]- Penny reported they cannot take patient due to history of wandering.  [facility]- waiting to hear back from [place].  Sent patient's information to SNFs with Memory Care.   Spoke to [place] at [facility] to ask if they could take patient back with Home Health. Izora Gala reported patient is too deconditioned to return to ALF at this point. Izora Gala reported they have sent several residents who wandered to [facility] [place] and they met patient's needs well. She reported patient does not wander as much as  some of the residents they have sent to [facility] in the past. Izora Gala reported patient does not have exit seeking behaviors, will just wander trying to find people to talk to/thinking he is on a business trip. She reported patient is very pleasant and has a very supportive family. CSW reached out to [place] at [facility] and informed him of this, asked if they can consider taking patient based on this information. Waiting to hear back.     Expected Discharge Plan: [place] Barriers to Discharge: Continued Medical Work up  Expected Discharge Plan and Services Expected Discharge Plan: [place] arrangements for the past 2 months: [place]                                       Social Determinants of Health (SDOH) Interventions    Readmission Risk Interventions Readmission Risk Prevention Plan 04/07/2020  Transportation Screening Complete  PCP or Specialist Appt within 3-5 Days Complete  HRI or [place] Complete  Social Work Consult for [place] Planning/Counseling Complete  Palliative Care Screening Not Applicable  Medication Review Press photographer) Complete  Some recent data might be hidden

## 2020-04-09 NOTE — Progress Notes (Signed)
Physical Therapy Treatment Patient Details Name: Paul Bryan MRN: [id number] DOB: 10/08/1929 Today's Date: 04/09/2020    History of Present Illness Pt is an 86 y/o M admitted from [facility] on 04/03/20 with c/c of weakness after being found on floor & too weak to get up. Pt tested positive for Covid 19 on 04/01/20. Pt admitted for tx of pneumonia due to covid 19. PMH: dementia, HTN, restless leg syndrome    PT Comments    Pt sleeping on arrival but willing to get up and do some activity with PT.  He was not overly interactive but did appear to understand and make the effort to do most requested acts.  He was able to ambulate in room ~70 ft though w/o direct cuing and assist to keep walker moving he did extend gait to more than a shuffle, but self selects to slow, shuffling gait wo/ this assist.  Pt on room air t/o the session and sats stayed in the 90s though he did have a good amount of non-productive coughing t/o the session.     Follow Up Recommendations  SNF;Supervision/Assistance - 24 hour     Equipment Recommendations   (RW in the unlikely event he does not currently have one)    Recommendations for Other Services       Precautions / Restrictions Precautions Precautions: Fall Restrictions Weight Bearing Restrictions: No    Mobility  Bed Mobility Overal bed mobility: Needs Assistance Bed Mobility: Supine to Sit     Supine to sit: Min assist     General bed mobility comments: Pt able to roll reasonably well (slowly but without direct assist), but needed assist to get trunk upright at EOB  Transfers Overall transfer level: Needs assistance Equipment used: Rolling walker (2 wheeled) Transfers: Sit to/from Stand Sit to Stand: Min assist         General transfer comment: Pt needed assist to scoot into position at EOB, attempt to stand w/o assist unsuccessful, but with some light assist to initiate and keep weight forward   Ambulation/Gait Ambulation/Gait  assistance: Min guard Gait Distance (Feet): 70 Feet Assistive device: Rolling walker (2 wheeled)       General Gait Details: very slow and shuffling self selected gait but with direct assist to maintain walker motion from PT (As well as cues to do so) he was able to lengthen steps and take almost consistent reciprocal cadence.   Stairs             Wheelchair Mobility    Modified Rankin (Stroke Patients Only)       Balance Overall balance assessment: Needs assistance Sitting-balance support: Single extremity supported Sitting balance-Leahy Scale: Good     Standing balance support: Bilateral upper extremity supported Standing balance-Leahy Scale: Fair Standing balance comment: reliant on the walker, poor awareness but no overt safety issues                            Cognition Arousal/Alertness: Lethargic Behavior During Therapy: WFL for tasks assessed/performed Overall Cognitive Status:  (followed simple instructions, minimal true interaction)                                        Exercises General Exercises - Lower Extremity Long Arc Quad: Strengthening;10 reps Heel Slides: Strengthening;10 reps Hip ABduction/ADduction: Strengthening;10 reps (pt struggled to consistently perform  as instructed) Hip Flexion/Marching: Strengthening;10 reps;Seated    General Comments        Pertinent Vitals/Pain Pain Assessment: No/denies pain    Home Living                      Prior Function            PT Goals (current goals can now be found in the care plan section) Progress towards PT goals: Progressing toward goals    Frequency    Min 2X/week      PT Plan Current plan remains appropriate    Co-evaluation              AM-PAC PT "6 Clicks" Mobility   Outcome Measure  Help needed turning from your back to your side while in a flat bed without using bedrails?: A Lot Help needed moving from lying on your back to  sitting on the side of a flat bed without using bedrails?: A Little Help needed moving to and from a bed to a chair (including a wheelchair)?: A Little Help needed standing up from a chair using your arms (e.g., wheelchair or bedside chair)?: A Little Help needed to walk in hospital room?: A Little Help needed climbing 3-5 steps with a railing? : A Lot 6 Click Score: 16    End of Session Equipment Utilized During Treatment: Gait belt Activity Tolerance: Patient tolerated treatment well Patient left: in chair;with nursing/sitter in room Nurse Communication: Mobility status PT Visit Diagnosis: Unsteadiness on feet (R26.81);Difficulty in walking, not elsewhere classified (R26.2);Muscle weakness (generalized) (M62.81)     Time: 7829-5621 PT Time Calculation (min) (ACUTE ONLY): 33 min  Charges:  $Gait Training: 8-22 mins $Therapeutic Exercise: 8-22 mins                     Malachi Pro, DPT 04/09/2020, 6:15 PM

## 2020-04-09 NOTE — Progress Notes (Signed)
PROGRESS NOTE    Paul Bryan  GUY:[id number] DOB: May 20, 1929 DOA: 04/03/2020 PCP: Kandyce Rud, MD  Brief Narrative:  Paul Bryan a 86 y.o.male with past medical history of dementia and hypertension was brought into the hospital from skilled nursing facility for evaluation of weakness.  Of note, patient tested positive for the COVID-19 virus on 11/23.He is unvaccinated.   He was reported to have nonproductive cough with shortness of breath and  EMS was called in when he was noted to hypoxic with  pulse ox of 88-89% on room air.  Patient was given supplemental oxygen with improvement to the high 90s on 2 L.  Initial laboratory data was notable for creatinine of 2.2 above baseline of 1.7.  Lactate was within normal limits.  WBC was within normal limits.  Chest x-ray showed bibasilar opacities.  In the ED, patient received remdesivir and Solu-Medrol and was considered for admission to the hospital.  As of 04/09/2020 patient is on room air.  Per the nurse he is experiencing increasing cough.  Vital signs remained stable.  Patient afebrile.  No supplemental oxygen requirement.  Repeat chest x-ray demonstrates ill-defined hazy opacities consistent with known viral syndrome.  No consolidation.     Assessment & Plan:   Principal Problem:   Pneumonia due to COVID-19 virus Active Problems:   Hypertension   CKD (chronic kidney disease), stage III (HCC)   Dehydration   BPH (benign prostatic hyperplasia)   Acute respiratory failure (HCC)  Acute hypoxic respiratory failure, resolved Multifocal pneumonia secondary to COVID-19 Intractable cough Patient completed course of remdesivir Continues to have cough Patient on room air Plan: Continue steroids Antitussives I-S and flutter valve use if tolerated Supplemental oxygen as needed  Alzheimer's dementia Continue regimen of galantamine As needed Ativan Trazodone Seroquel One-to-one sitter at bedside for patient  safety Frequent reorienting and delirium precautions  Essential hypertension Continue amlodipine  Acute kidney injury on chronic kidney disease stage IIIa Creatinine approaching baseline No IV fluids  BPH Flomax  Hyponatremia Mild, asymptomatic Continue to monitor    DVT prophylaxis: Lovenox Code Status: DNR Family Communication: Son Paul Bryan [phone] on 04/09/2020 Disposition Plan:  Status is: Inpatient  Remains inpatient appropriate because:Unsafe d/c plan and Inpatient level of care appropriate due to severity of illness   Dispo: The patient is from: ALF              Anticipated d/c is to: SNF              Anticipated d/c date is: 1 day              Patient currently is not medically stable to d/c.       Consultants:   None  Procedures:   None  Antimicrobials:   None   Subjective: Seen and examined.  No acute distress.  Appears frail.  Confused.  Objective: Vitals:   04/08/20 2334 04/09/20 0351 04/09/20 0726 04/09/20 1104  BP: (!) 155/81 140/88 128/82 (!) 159/98  Pulse: 68 72 73 66  Resp: 17 18 18 18   Temp: 99.1 F (37.3 C) 98.9 F (37.2 C) 98.6 F (37 C) 99.2 F (37.3 C)  TempSrc:   Oral Oral  SpO2: 94% 93% 93% 92%  Weight:      Height:        Intake/Output Summary (Last 24 hours) at 04/09/2020 1342 Last data filed at 04/09/2020 0519 Gross per 24 hour  Intake --  Output 1300 ml  Net -1300  ml   Filed Weights   04/03/20 0722 04/08/20 1116  Weight: 72.6 kg 80.1 kg    Examination:  General exam: No acute distress.  Appears frail Respiratory system: Mild bilateral crackles.  Normal work of breathing.  Room air Cardiovascular system: S1 & S2 heard, RRR. No JVD, murmurs, rubs, gallops or clicks. No pedal edema. Gastrointestinal system: Abdomen is nondistended, soft and nontender. No organomegaly or masses felt. Normal bowel sounds heard. Central nervous system: Alert and awake.  Oriented to person.  No focal deficits.   Extremities: Symmetric 5 x 5 power. Skin: No rashes, lesions or ulcers Psychiatry: Judgement and insight appear impaired. Mood & affect flattened.     Data Reviewed: I have personally reviewed following labs and imaging studies  CBC: Recent Labs  Lab 04/04/20 0544 04/05/20 0538 04/06/20 0447 04/07/20 0557 04/08/20 0720  WBC 6.9 10.7* 7.8 12.9* 11.8*  NEUTROABS 5.2 7.4 6.3 10.0* 9.0*  HGB 12.8* 12.8* 13.1 12.2* 11.7*  HCT 37.5* 38.5* 38.6* 38.7* 34.1*  MCV 79.6* 80.2 79.6* 84.7 78.9*  PLT 274 324 341 344 390   Basic Metabolic Panel: Recent Labs  Lab 04/04/20 0544 04/04/20 0544 04/05/20 0538 04/06/20 0447 04/07/20 0557 04/08/20 0720 04/09/20 0542  NA 138   < > 139 139 135 130* 131*  K 3.6   < > 3.8 4.3 3.9 3.9 3.8  CL 103   < > 104 103 102 97* 99  CO2 20*   < > 23 21* 22 21* 22  GLUCOSE 123*   < > 84 130* 102* 111* 107*  BUN 31*   < > 36* 46* 52* 42* 35*  CREATININE 1.73*   < > 1.70* 1.94* 1.83* 1.46* 1.27*  CALCIUM 8.3*   < > 8.8* 8.8* 8.3* 8.3* 8.2*  MG 2.1   < > 1.8 1.9 1.9 2.2 2.0  PHOS 4.0  --  2.9 5.1* 3.4 3.6  --    < > = values in this interval not displayed.   GFR: Estimated Creatinine Clearance: 38.9 mL/min (A) (by C-G formula based on SCr of 1.27 mg/dL (H)). Liver Function Tests: Recent Labs  Lab 04/05/20 0538 04/06/20 0447 04/07/20 0557 04/08/20 0720 04/09/20 0542  AST 73* 81* 60* 52* 48*  ALT 37 54* 43 41 38  ALKPHOS 52 54 50 50 54  BILITOT 0.6 0.5 0.6 0.8 0.9  PROT 6.6 6.8 6.4* 6.1* 6.2*  ALBUMIN 3.2* 3.1* 2.9* 2.8* 2.8*   No results for input(s): LIPASE, AMYLASE in the last 168 hours. No results for input(s): AMMONIA in the last 168 hours. Coagulation Profile: No results for input(s): INR, PROTIME in the last 168 hours. Cardiac Enzymes: No results for input(s): CKTOTAL, CKMB, CKMBINDEX, TROPONINI in the last 168 hours. BNP (last 3 results) No results for input(s): PROBNP in the last 8760 hours. HbA1C: No results for input(s):  HGBA1C in the last 72 hours. CBG: Recent Labs  Lab 04/06/20 0808  GLUCAP 120*   Lipid Profile: No results for input(s): CHOL, HDL, LDLCALC, TRIG, CHOLHDL, LDLDIRECT in the last 72 hours. Thyroid Function Tests: No results for input(s): TSH, T4TOTAL, FREET4, T3FREE, THYROIDAB in the last 72 hours. Anemia Panel: Recent Labs    04/07/20 0557 04/08/20 0720  FERRITIN 83 86   Sepsis Labs: Recent Labs  Lab 04/03/20 0726  PROCALCITON <0.10  LATICACIDVEN 1.1    Recent Results (from the past 240 hour(s))  Blood culture (single)     Status: None   Collection Time: 04/03/20  7:27 AM   Specimen: BLOOD  Result Value Ref Range Status   Specimen Description BLOOD RIGHT AC  Final   Special Requests   Final    BOTTLES DRAWN AEROBIC AND ANAEROBIC Blood Culture results may not be optimal due to an excessive volume of blood received in culture bottles   Culture   Final    NO GROWTH 5 DAYS Performed at [facility], [address] [place], [place] [address]    Report Status 04/08/2020 FINAL  Final  Resp Panel by RT-PCR (Flu A&B, Covid) Nasopharyngeal Swab     Status: Abnormal   Collection Time: 04/05/20  9:08 AM   Specimen: Nasopharyngeal Swab; Nasopharyngeal(NP) swabs in vial transport medium  Result Value Ref Range Status   SARS Coronavirus 2 by RT PCR POSITIVE (A) NEGATIVE Final    Comment: RESULT CALLED TO, READ BACK BY AND VERIFIED WITH: EMILY RUIZ 04/05/20 AT 1202 BY ACR (NOTE) SARS-CoV-2 target nucleic acids are DETECTED.  The SARS-CoV-2 RNA is generally detectable in upper respiratory specimens during the acute phase of infection. Positive results are indicative of the presence of the identified virus, but do not rule out bacterial infection or co-infection with other pathogens not detected by the test. Clinical correlation with patient history and other diagnostic information is necessary to determine patient infection status. The expected result is  Negative.  Fact Sheet for Patients: BloggerCourse.com  Fact Sheet for Healthcare Providers: SeriousBroker.it  This test is not yet approved or cleared by the [place] FDA and  has been authorized for detection and/or diagnosis of SARS-CoV-2 by FDA under an Emergency Use Authorization (EUA).  This EUA will remain in effect (meaning this test can b e used) for the duration of  the COVID-19 declaration under Section 564(b)(1) of the Act, 21 U.S.C. section 360bbb-3(b)(1), unless the authorization is terminated or revoked sooner.     Influenza A by PCR NEGATIVE NEGATIVE Final   Influenza B by PCR NEGATIVE NEGATIVE Final    Comment: (NOTE) The Xpert Xpress SARS-CoV-2/FLU/RSV plus assay is intended as an aid in the diagnosis of influenza from Nasopharyngeal swab specimens and should not be used as a sole basis for treatment. Nasal washings and aspirates are unacceptable for Xpert Xpress SARS-CoV-2/FLU/RSV testing.  Fact Sheet for Patients: BloggerCourse.com  Fact Sheet for Healthcare Providers: SeriousBroker.it  This test is not yet approved or cleared by the [place] FDA and has been authorized for detection and/or diagnosis of SARS-CoV-2 by FDA under an Emergency Use Authorization (EUA). This EUA will remain in effect (meaning this test can be used) for the duration of the COVID-19 declaration under Section 564(b)(1) of the Act, 21 U.S.C. section 360bbb-3(b)(1), unless the authorization is terminated or revoked.  Performed at [facility], [address], [place], [place] [address]          Radiology Studies: [facility] Chest [place] 1 View  Result Date: 04/09/2020 CLINICAL DATA:  Weakness.  Hypertension.  Reported COVID-19 positive EXAM: PORTABLE CHEST 1 VIEW COMPARISON:  April 03, 2020 FINDINGS: Ill-defined airspace opacity is noted in the right mid lung and  more subtly in the left mid lung and left base regions. No consolidation. Heart is upper normal in size with pulmonary vascularity normal. No adenopathy. There is aortic atherosclerosis. No bone lesions. IMPRESSION: Ill-defined airspace opacity right mid lung and more subtly in portions of the left mid lower lung regions. Suspect multifocal atypical organism pneumonia. No consolidation. Stable cardiac silhouette. No adenopathy evident. Aortic  Atherosclerosis (ICD10-I70.0). Electronically Signed   By: Bretta BangWilliam  Woodruff III M.D.   On: 04/09/2020 13:00        Scheduled Meds: . albuterol  2 puff Inhalation Q6H  . amLODipine  2.5 mg Oral Daily  . vitamin C  500 mg Oral Daily  . aspirin EC  81 mg Oral Daily  . dexamethasone  6 mg Oral Q24H  . enoxaparin (LOVENOX) injection  40 mg Subcutaneous Q24H  . famotidine  20 mg Oral Daily  . galantamine  8 mg Oral Daily  . melatonin  5 mg Oral QHS  . pantoprazole  40 mg Oral Daily  . senna  1 tablet Oral Daily  . simvastatin  20 mg Oral QHS  . sodium chloride flush  3 mL Intravenous Q12H  . tamsulosin  0.4 mg Oral Daily  . traZODone  50 mg Oral QHS  . Vitamin D (Ergocalciferol)  50,000 Units Oral Daily  . zinc sulfate  220 mg Oral Daily   Continuous Infusions: . sodium chloride       LOS: 6 days    Time spent: 25 minutes    Tresa MooreSudheer B Nashton Belson, MD [facility] Pager [phone]  If 7PM-7AM, please contact night-coverage 04/09/2020, 1:42 PM

## 2020-04-09 NOTE — Progress Notes (Signed)
Patient's son updated via phone

## 2020-04-10 DIAGNOSIS — U071 COVID-19: Secondary | ICD-10-CM | POA: Diagnosis not present

## 2020-04-10 DIAGNOSIS — J1282 Pneumonia due to coronavirus disease 2019: Secondary | ICD-10-CM | POA: Diagnosis not present

## 2020-04-10 LAB — COMPREHENSIVE METABOLIC PANEL
ALT: 37 U/L (ref 0–44)
AST: 67 U/L — ABNORMAL HIGH (ref 15–41)
Albumin: 2.9 g/dL — ABNORMAL LOW (ref 3.5–5.0)
Alkaline Phosphatase: 54 U/L (ref 38–126)
Anion gap: 11 (ref 5–15)
BUN: 34 mg/dL — ABNORMAL HIGH (ref 8–23)
CO2: 23 mmol/L (ref 22–32)
Calcium: 8.3 mg/dL — ABNORMAL LOW (ref 8.9–10.3)
Chloride: 99 mmol/L (ref 98–111)
Creatinine, Ser: 1.53 mg/dL — ABNORMAL HIGH (ref 0.61–1.24)
GFR, Estimated: 43 mL/min — ABNORMAL LOW (ref >60)
Glucose, Bld: 96 mg/dL (ref 70–99)
Potassium: 3.7 mmol/L (ref 3.5–5.1)
Sodium: 133 mmol/L — ABNORMAL LOW (ref 135–145)
Total Bilirubin: 0.7 mg/dL (ref 0.3–1.2)
Total Protein: 6.3 g/dL — ABNORMAL LOW (ref 6.5–8.1)

## 2020-04-10 LAB — MAGNESIUM: Magnesium: 2.2 mg/dL (ref 1.7–2.4)

## 2020-04-10 MED ORDER — PANTOPRAZOLE SODIUM 40 MG PO PACK
40.0000 mg | PACK | Freq: Every day | ORAL | Status: DC
  Administered 2020-04-10 – 2020-04-11 (×2): 40 mg via ORAL
  Filled 2020-04-10 (×2): qty 20

## 2020-04-10 NOTE — Evaluation (Signed)
Clinical/Bedside Swallow Evaluation Patient Details  Name: Paul Bryan MRN: [id number] Date of Birth: 17-Apr-1930  Today's Date: 04/10/2020 Time: SLP Start Time (ACUTE ONLY): 1300 SLP Stop Time (ACUTE ONLY): 1400 SLP Time Calculation (min) (ACUTE ONLY): 60 min  Past Medical History:  Past Medical History:  Diagnosis Date  . Hypertension   . Restless leg syndrome    Past Surgical History:  Past Surgical History:  Procedure Laterality Date  . ABLATION    . APPENDECTOMY    . COLONOSCOPY N/A 10/10/2017   Procedure: COLONOSCOPY;  Surgeon: Wyline Mood, MD;  Location: [facility] ENDOSCOPY;  Service: Gastroenterology;  Laterality: N/A;  . ESOPHAGOGASTRODUODENOSCOPY N/A 10/10/2017   Procedure: ESOPHAGOGASTRODUODENOSCOPY (EGD);  Surgeon: Wyline Mood, MD;  Location: [facility] ENDOSCOPY;  Service: Gastroenterology;  Laterality: N/A;  . FISSURECTOMY     HPI:  Per admitting H&P "Paul Bryan is a 86 y.o. male with medical history significant for dementia and hypertension who was brought into the emergency room from the skilled nursing facility where he resides for evaluation of weakness.  Patient tested positive for the COVID-19 virus on 11/23.  He is unvaccinated.  He has a nonproductive cough and per the nursing home staff has had shortness of breath.  On the day of admission he was found on the floor by the nursing home staff and was too weak to get up.  EMS was called and when they arrived he was noted to have room air pulse oximetry of 88 to 89% and this improved to the high 90s on 2 L."   Assessment / Plan / Recommendation Clinical Impression  Pt was sleeping upon ST entering but awakened easily for bedside swallow eval. Pt presents with mostly functional swallowing abilities. Noted immediate coughing after the very first sip of water from the cup although no other s/s of aspiration with several other boluses during bedside swallow eval today. Pt tolerated thin liquids best by straw as it was easier for hm  to control the amount of liquid and bolus size. Pt tolerated applesauce and pieces of graham cracker well. Noted oral transit delay with solids but Pt was able to clear oral cavity after the swallow with alternating sips of liquid. Rec Dys 3 diet with thin liquids while utilizing aspiration precautions. ST to follow up with toleration of diet 1-2 days. COVID 19 precautions taken throughout this swallowing evaluation. SLP Visit Diagnosis: Dysphagia, oropharyngeal phase (R13.12)    Aspiration Risk  Mild aspiration risk    Diet Recommendation Dysphagia 3 (Mech soft)   Medication Administration: Whole meds with puree Supervision: Patient able to self feed Compensations: Small sips/bites Postural Changes: Seated upright at 90 degrees;Remain upright for at least 30 minutes after po intake    Other  Recommendations  Aspiration Precautions  Follow up Recommendations    Will F/U with toleration of diet    Frequency and Duration Other (Comment)  1 week       Prognosis   Good     Swallow Study   General Date of Onset: 04/03/20 HPI: Per admitting H&P "Paul Bryan is a 86 y.o. male with medical history significant for dementia and hypertension who was brought into the emergency room from the skilled nursing facility where he resides for evaluation of weakness.  Patient tested positive for the COVID-19 virus on 11/23.  He is unvaccinated.  He has a nonproductive cough and per the nursing home staff has had shortness of breath.  On the day of admission he was  found on the floor by the nursing home staff and was too weak to get up.  EMS was called and when they arrived he was noted to have room air pulse oximetry of 88 to 89% and this improved to the high 90s on 2 L." Type of Study: Bedside Swallow Evaluation Diet Prior to this Study: Regular Temperature Spikes Noted: No Respiratory Status: Room air History of Recent Intubation: No Behavior/Cognition: Alert;Cooperative;Pleasant mood Oral  Cavity Assessment: Within Functional Limits Oral Cavity - Dentition: Adequate natural dentition Vision: Functional for self-feeding Self-Feeding Abilities: Able to feed self Patient Positioning: Upright in bed Baseline Vocal Quality: Normal    Oral/Motor/Sensory Function Overall Oral Motor/Sensory Function: Within functional limits   Ice Chips Ice chips: Within functional limits   Thin Liquid Thin Liquid: Within functional limits Presentation: Straw Other Comments: Coughing after very first sip from the cup. No other s/s of aspiration.    Nectar Thick Nectar Thick Liquid: Not tested   Honey Thick Honey Thick Liquid: Not tested   Puree Puree: Within functional limits Presentation: Spoon   Solid     Solid:  (Slow but adequate oral transit) Presentation: Self Fed      Eather Colas 04/10/2020,2:00 PM

## 2020-04-10 NOTE — Progress Notes (Signed)
Pt is experiencing several moments of confusion with a successful attempt to get out of the bed with an unsteady gait. Pt was observed to be standing in the room despite being told to remain in bed. Telesitter is currently in use.

## 2020-04-10 NOTE — TOC Progression Note (Addendum)
Transition of Care [facility]) - Progression Note    Patient Details  Name: Paul Bryan MRN: [id number] Date of Birth: [birth year]  [facility] CM/SW Contact  Liliana Cline, LCSW Phone Number: 04/10/2020, 8:22 AM  Clinical Narrative:   CSW reached out to [facility] again to follow up on conversation from yesterday, asked if they can make a bed offer. [facility] willing to take tomorrow with wanderguard after update from [place] at [facility] Thayer Ohm reported they can accept patient tomorrow as long as he is symptom free, Dr. Georgeann Oppenheim reported patient is symptom free. CSW updated patient's son [name] that [facility] is the only local facility that can accept patient at this time. [name] is going to talk with his wife to confirm but feels it would be beneficial for patient to get to SNF rehab as soon as possible so stated that they will likely agree to [facility] tomorrow.   Expected Discharge Plan: Skilled Nursing Facility Barriers to Discharge: Continued Medical Work up  Expected Discharge Plan and Services Expected Discharge Plan: Skilled Nursing Facility       Living arrangements for the past 2 months: Assisted Living Facility                                       Social Determinants of Health (SDOH) Interventions    Readmission Risk Interventions Readmission Risk Prevention Plan 04/07/2020  Transportation Screening Complete  PCP or Specialist Appt within 3-5 Days Complete  HRI or Home Care Consult Complete  Social Work Consult for Recovery Care Planning/Counseling Complete  Palliative Care Screening Not Applicable  Medication Review Oceanographer) Complete  Some recent data might be hidden

## 2020-04-10 NOTE — Progress Notes (Signed)
PROGRESS NOTE    Paul Bryan  ZMO:[id number] DOB: Sep 24, 1929 DOA: 04/03/2020 PCP: Kandyce Rud, MD  Brief Narrative:  Paul Alberta Parkeris a 86 y.o.male with past medical history of dementia and hypertension was brought into the hospital from skilled nursing facility for evaluation of weakness.  Of note, patient tested positive for the COVID-19 virus on 11/23.He is unvaccinated.   He was reported to have nonproductive cough with shortness of breath and  EMS was called in when he was noted to hypoxic with  pulse ox of 88-89% on room air.  Patient was given supplemental oxygen with improvement to the high 90s on 2 L.  Initial laboratory data was notable for creatinine of 2.2 above baseline of 1.7.  Lactate was within normal limits.  WBC was within normal limits.  Chest x-ray showed bibasilar opacities.  In the ED, patient received remdesivir and Solu-Medrol and was considered for admission to the hospital.  As of 04/09/2020 patient is on room air.  Per the nurse he is experiencing increasing cough.  Vital signs remained stable.  Patient afebrile.  No supplemental oxygen requirement.  Repeat chest x-ray demonstrates ill-defined hazy opacities consistent with known viral syndrome.  No consolidation.     Assessment & Plan:   Principal Problem:   Pneumonia due to COVID-19 virus Active Problems:   Hypertension   CKD (chronic kidney disease), stage III (HCC)   Dehydration   BPH (benign prostatic hyperplasia)   Acute respiratory failure (HCC)  Acute hypoxic respiratory failure, resolved Multifocal pneumonia secondary to COVID-19 Intractable cough Patient completed course of remdesivir Continues to have cough Patient on room air Plan: Continue steroids, day 8/10 Antitussives I-S and flutter valve use if tolerated Supplemental oxygen as needed  Alzheimer's dementia Continue regimen of galantamine As needed Ativan Trazodone Seroquel Reorder one-to-one sitter Frequent  reorienting measures Delirium precautions  Essential hypertension Continue amlodipine  Acute kidney injury on chronic kidney disease stage IIIa Creatinine at or near baseline Encourage p.o. fluid intake No IV fluids  BPH Flomax  Hyponatremia Mild, asymptomatic Continue to monitor    DVT prophylaxis: Lovenox Code Status: DNR Family Communication: Son Jagjit Riner [phone] on 04/09/2020 Disposition Plan:  Status is: Inpatient  Remains inpatient appropriate because:Inpatient level of care appropriate due to severity of illness   Dispo: The patient is from: ALF              Anticipated d/c is to: SNF              Anticipated d/c date is: 1 day              Patient currently is medically stable to d/c.   At this time patient is medically ready for discharge.  We are awaiting skilled nursing facility acceptance.  Per case management patient may be able to discharge soon as tomorrow 04/11/2020             Consultants:   None  Procedures:   None  Antimicrobials:   None   Subjective: Patient seen and examined.  No acute distress.  Appears frail and weak.  Confused.  Room air  Objective: Vitals:   04/09/20 1500 04/09/20 1940 04/10/20 0746 04/10/20 1147  BP: (!) 138/57 (!) 125/111 (!) 149/73 139/84  Pulse: 63 (!) 58 71 71  Resp: 18 20 18 16   Temp: 99.4 F (37.4 C) 99 F (37.2 C) 99.6 F (37.6 C) 99.1 F (37.3 C)  TempSrc: Oral  Oral   SpO2: 92%  95% 90% 90%  Weight:      Height:        Intake/Output Summary (Last 24 hours) at 04/10/2020 1221 Last data filed at 04/10/2020 0900 Gross per 24 hour  Intake --  Output 350 ml  Net -350 ml   Filed Weights   04/03/20 0722 04/08/20 1116  Weight: 72.6 kg 80.1 kg    Examination:  General exam: No acute distress.  Appears frail Respiratory system: Mild bilateral crackles.  Normal work of breathing.  Room air Cardiovascular system: S1 & S2 heard, RRR. No JVD, murmurs, rubs, gallops or clicks. No  pedal edema. Gastrointestinal system: Abdomen is nondistended, soft and nontender. No organomegaly or masses felt. Normal bowel sounds heard. Central nervous system: Alert and awake.  Oriented to person.  No focal deficits.  Extremities: Symmetric 5 x 5 power. Skin: No rashes, lesions or ulcers Psychiatry: Judgement and insight appear impaired. Mood & affect flattened.     Data Reviewed: I have personally reviewed following labs and imaging studies  CBC: Recent Labs  Lab 04/04/20 0544 04/05/20 0538 04/06/20 0447 04/07/20 0557 04/08/20 0720  WBC 6.9 10.7* 7.8 12.9* 11.8*  NEUTROABS 5.2 7.4 6.3 10.0* 9.0*  HGB 12.8* 12.8* 13.1 12.2* 11.7*  HCT 37.5* 38.5* 38.6* 38.7* 34.1*  MCV 79.6* 80.2 79.6* 84.7 78.9*  PLT 274 324 341 344 390   Basic Metabolic Panel: Recent Labs  Lab 04/04/20 0544 04/04/20 0544 04/05/20 0538 04/05/20 0538 04/06/20 0447 04/07/20 0557 04/08/20 0720 04/09/20 0542 04/10/20 0457  NA 138   < > 139   < > 139 135 130* 131* 133*  K 3.6   < > 3.8   < > 4.3 3.9 3.9 3.8 3.7  CL 103   < > 104   < > 103 102 97* 99 99  CO2 20*   < > 23   < > 21* 22 21* 22 23  GLUCOSE 123*   < > 84   < > 130* 102* 111* 107* 96  BUN 31*   < > 36*   < > 46* 52* 42* 35* 34*  CREATININE 1.73*   < > 1.70*   < > 1.94* 1.83* 1.46* 1.27* 1.53*  CALCIUM 8.3*   < > 8.8*   < > 8.8* 8.3* 8.3* 8.2* 8.3*  MG 2.1   < > 1.8   < > 1.9 1.9 2.2 2.0 2.2  PHOS 4.0  --  2.9  --  5.1* 3.4 3.6  --   --    < > = values in this interval not displayed.   GFR: Estimated Creatinine Clearance: 32.3 mL/min (A) (by C-G formula based on SCr of 1.53 mg/dL (H)). Liver Function Tests: Recent Labs  Lab 04/06/20 0447 04/07/20 0557 04/08/20 0720 04/09/20 0542 04/10/20 0457  AST 81* 60* 52* 48* 67*  ALT 54* 43 41 38 37  ALKPHOS 54 50 50 54 54  BILITOT 0.5 0.6 0.8 0.9 0.7  PROT 6.8 6.4* 6.1* 6.2* 6.3*  ALBUMIN 3.1* 2.9* 2.8* 2.8* 2.9*   No results for input(s): LIPASE, AMYLASE in the last 168 hours. No  results for input(s): AMMONIA in the last 168 hours. Coagulation Profile: No results for input(s): INR, PROTIME in the last 168 hours. Cardiac Enzymes: No results for input(s): CKTOTAL, CKMB, CKMBINDEX, TROPONINI in the last 168 hours. BNP (last 3 results) No results for input(s): PROBNP in the last 8760 hours. HbA1C: No results for input(s): HGBA1C in the last 72 hours. CBG:  Recent Labs  Lab 04/06/20 0808  GLUCAP 120*   Lipid Profile: No results for input(s): CHOL, HDL, LDLCALC, TRIG, CHOLHDL, LDLDIRECT in the last 72 hours. Thyroid Function Tests: No results for input(s): TSH, T4TOTAL, FREET4, T3FREE, THYROIDAB in the last 72 hours. Anemia Panel: Recent Labs    04/08/20 0720  FERRITIN 86   Sepsis Labs: No results for input(s): PROCALCITON, LATICACIDVEN in the last 168 hours.  Recent Results (from the past 240 hour(s))  Blood culture (single)     Status: None   Collection Time: 04/03/20  7:27 AM   Specimen: BLOOD  Result Value Ref Range Status   Specimen Description BLOOD RIGHT [facility]  Final   Special Requests   Final    BOTTLES DRAWN AEROBIC AND ANAEROBIC Blood Culture results may not be optimal due to an excessive volume of blood received in culture bottles   Culture   Final    NO GROWTH 5 DAYS Performed at [facility], [address] [place], [place] [address]    Report Status 04/08/2020 FINAL  Final  Resp Panel by RT-PCR (Flu A&B, Covid) Nasopharyngeal Swab     Status: Abnormal   Collection Time: 04/05/20  9:08 AM   Specimen: Nasopharyngeal Swab; Nasopharyngeal(NP) swabs in vial transport medium  Result Value Ref Range Status   SARS Coronavirus 2 by RT PCR POSITIVE (A) NEGATIVE Final    Comment: RESULT CALLED TO, READ BACK BY AND VERIFIED WITH: EMILY RUIZ 04/05/20 AT 1202 BY ACR (NOTE) SARS-CoV-2 target nucleic acids are DETECTED.  The SARS-CoV-2 RNA is generally detectable in upper respiratory specimens during the acute phase of infection. Positive  results are indicative of the presence of the identified virus, but do not rule out bacterial infection or co-infection with other pathogens not detected by the test. Clinical correlation with patient history and other diagnostic information is necessary to determine patient infection status. The expected result is Negative.  Fact Sheet for Patients: BloggerCourse.com  Fact Sheet for Healthcare Providers: SeriousBroker.it  This test is not yet approved or cleared by the [place] FDA and  has been authorized for detection and/or diagnosis of SARS-CoV-2 by FDA under an Emergency Use Authorization (EUA).  This EUA will remain in effect (meaning this test can b e used) for the duration of  the COVID-19 declaration under Section 564(b)(1) of the Act, 21 U.S.C. section 360bbb-3(b)(1), unless the authorization is terminated or revoked sooner.     Influenza A by PCR NEGATIVE NEGATIVE Final   Influenza B by PCR NEGATIVE NEGATIVE Final    Comment: (NOTE) The Xpert Xpress SARS-CoV-2/FLU/RSV plus assay is intended as an aid in the diagnosis of influenza from Nasopharyngeal swab specimens and should not be used as a sole basis for treatment. Nasal washings and aspirates are unacceptable for Xpert Xpress SARS-CoV-2/FLU/RSV testing.  Fact Sheet for Patients: BloggerCourse.com  Fact Sheet for Healthcare Providers: SeriousBroker.it  This test is not yet approved or cleared by the [place] FDA and has been authorized for detection and/or diagnosis of SARS-CoV-2 by FDA under an Emergency Use Authorization (EUA). This EUA will remain in effect (meaning this test can be used) for the duration of the COVID-19 declaration under Section 564(b)(1) of the Act, 21 U.S.C. section 360bbb-3(b)(1), unless the authorization is terminated or revoked.  Performed at [facility], [address] [place], [place] [address]          Radiology Studies: [facility] Chest [place] 1 View  Result Date: 04/09/2020 CLINICAL  DATA:  Weakness.  Hypertension.  Reported COVID-19 positive EXAM: PORTABLE CHEST 1 VIEW COMPARISON:  April 03, 2020 FINDINGS: Ill-defined airspace opacity is noted in the right mid lung and more subtly in the left mid lung and left base regions. No consolidation. Heart is upper normal in size with pulmonary vascularity normal. No adenopathy. There is aortic atherosclerosis. No bone lesions. IMPRESSION: Ill-defined airspace opacity right mid lung and more subtly in portions of the left mid lower lung regions. Suspect multifocal atypical organism pneumonia. No consolidation. Stable cardiac silhouette. No adenopathy evident. Aortic Atherosclerosis (ICD10-I70.0). Electronically Signed   By: Bretta Bang III M.D.   On: 04/09/2020 13:00        Scheduled Meds: . albuterol  2 puff Inhalation Q6H  . amLODipine  2.5 mg Oral Daily  . vitamin C  500 mg Oral Daily  . aspirin EC  81 mg Oral Daily  . dexamethasone  6 mg Oral Q24H  . enoxaparin (LOVENOX) injection  40 mg Subcutaneous Q24H  . famotidine  20 mg Oral Daily  . galantamine  8 mg Oral Daily  . melatonin  5 mg Oral QHS  . pantoprazole sodium  40 mg Oral Daily  . senna  1 tablet Oral Daily  . simvastatin  20 mg Oral QHS  . sodium chloride flush  3 mL Intravenous Q12H  . tamsulosin  0.4 mg Oral Daily  . traZODone  50 mg Oral QHS  . Vitamin D (Ergocalciferol)  50,000 Units Oral Daily  . zinc sulfate  220 mg Oral Daily   Continuous Infusions: . sodium chloride       LOS: 7 days    Time spent: 15 minutes    Tresa Moore, MD [facility] Pager [phone]  If 7PM-7AM, please contact night-coverage 04/10/2020, 12:21 PM

## 2020-04-11 DIAGNOSIS — J1282 Pneumonia due to coronavirus disease 2019: Secondary | ICD-10-CM | POA: Diagnosis not present

## 2020-04-11 DIAGNOSIS — U071 COVID-19: Secondary | ICD-10-CM | POA: Diagnosis not present

## 2020-04-11 LAB — COMPREHENSIVE METABOLIC PANEL
ALT: 39 U/L (ref 0–44)
AST: 44 U/L — ABNORMAL HIGH (ref 15–41)
Albumin: 3 g/dL — ABNORMAL LOW (ref 3.5–5.0)
Alkaline Phosphatase: 61 U/L (ref 38–126)
Anion gap: 12 (ref 5–15)
BUN: 40 mg/dL — ABNORMAL HIGH (ref 8–23)
CO2: 23 mmol/L (ref 22–32)
Calcium: 8.4 mg/dL — ABNORMAL LOW (ref 8.9–10.3)
Chloride: 101 mmol/L (ref 98–111)
Creatinine, Ser: 1.67 mg/dL — ABNORMAL HIGH (ref 0.61–1.24)
GFR, Estimated: 39 mL/min — ABNORMAL LOW (ref >60)
Glucose, Bld: 97 mg/dL (ref 70–99)
Potassium: 3.6 mmol/L (ref 3.5–5.1)
Sodium: 136 mmol/L (ref 135–145)
Total Bilirubin: 0.8 mg/dL (ref 0.3–1.2)
Total Protein: 6.9 g/dL (ref 6.5–8.1)

## 2020-04-11 LAB — CBC
HCT: 38.8 % — ABNORMAL LOW (ref 39.0–52.0)
Hemoglobin: 13.2 g/dL (ref 13.0–17.0)
MCH: 27.5 pg (ref 26.0–34.0)
MCHC: 34 g/dL (ref 30.0–36.0)
MCV: 80.8 fL (ref 80.0–100.0)
Platelets: 508 10*3/uL — ABNORMAL HIGH (ref 150–400)
RBC: 4.8 MIL/uL (ref 4.22–5.81)
RDW: 15.5 % (ref 11.5–15.5)
WBC: 11.4 10*3/uL — ABNORMAL HIGH (ref 4.0–10.5)
nRBC: 0 % (ref 0.0–0.2)

## 2020-04-11 LAB — MAGNESIUM: Magnesium: 2.6 mg/dL — ABNORMAL HIGH (ref 1.7–2.4)

## 2020-04-11 MED ORDER — ALBUTEROL SULFATE HFA 108 (90 BASE) MCG/ACT IN AERS: 2.0000 | INHALATION_SPRAY | Freq: Four times a day (QID) | RESPIRATORY_TRACT | Status: DC

## 2020-04-11 MED ORDER — DEXAMETHASONE 6 MG PO TABS: 6.0000 mg | ORAL_TABLET | ORAL | 0 refills | Status: AC

## 2020-04-11 NOTE — Discharge Summary (Signed)
Physician Discharge Summary  Paul Bryan:[id number] DOB: 1929-11-12 DOA: 04/03/2020  PCP: Kandyce Rud, MD  Admit date: 04/03/2020 Discharge date: 04/11/2020  Admitted From: ALF Disposition: SNF  Recommendations for Outpatient Follow-up:  1. Follow up with PCP in 1-2 weeks 2.   Home Health:No Equipment/Devices:None Discharge Condition:Stable CODE STATUS:DNR Diet recommendation:  Dysphagia   Brief/Interim Summary: Paul N Parkeris a 86 y.o.malewith past medical history of dementia and hypertension was brought into the hospital from skilled nursing facility for evaluation of weakness. Of note, patient tested positive for the COVID-19 virus on 11/23.He is unvaccinated. He was reported to have nonproductive cough with shortness of breathandEMS was called in when he was noted to hypoxic withpulse ox of 88-89% on room air. Patient was given supplemental oxygen with improvement to the high 90s on 2 L. Initial laboratory data was notable for creatinine of 2.2 above baseline of 1.7. Lactate was within normal limits. WBC waswithin normal limits. Chest x-ray showed bibasilar opacities. In the ED,patient received remdesivir and Solu-Medrol and was considered for admission to the hospital.  As of 04/09/2020 patient is on room air.  Per the nurse he is experiencing increasing cough.  Vital signs remained stable.  Patient afebrile.  No supplemental oxygen requirement.  Repeat chest x-ray demonstrates ill-defined hazy opacities consistent with known viral syndrome.  No consolidation.  04/11/20: Patient seen and examined this morning.  Appears calm.  Answers all questions appropriately.  Sitting in bed eating a banana.  No visible distress.  Afebrile, on room air, remainder vital signs stable.  Stable for discharge to skilled nursing facility at this time.  Discharge Diagnoses:  Principal Problem:   Pneumonia due to COVID-19 virus Active Problems:   Hypertension   CKD  (chronic kidney disease), stage III (HCC)   Dehydration   BPH (benign prostatic hyperplasia)   Acute respiratory failure (HCC)  Acute hypoxic respiratory failure, resolved Multifocal pneumonia secondary to COVID-19 Intractable cough Patient completed course of remdesivir Continues to have cough Patient on room air Stable for discharge to skilled nursing facility Continue steroids to complete 10-day course Antitussives as needed at skilled nursing facility Would recommend continued use of incentive spirometry and flutter post discharge  Alzheimer's dementia Continue home regimen on discharge  Essential hypertension Continue amlodipine  Acute kidney injury on chronic kidney disease stage IIIa Creatinine at or near baseline Encourage p.o. fluid intake  BPH Flomax  Hyponatremia, resolved Mild, asymptomatic    Discharge Instructions  Discharge Instructions    Diet - low sodium heart healthy   Complete by: As directed    Increase activity slowly   Complete by: As directed      Allergies as of 04/11/2020   No Known Allergies     Medication List    STOP taking these medications   divalproex 250 MG 24 hr tablet Commonly known as: DEPAKOTE ER   Klor-Con M10 10 MEQ tablet Generic drug: potassium chloride   traMADol 50 MG tablet Commonly known as: ULTRAM     TAKE these medications   acetaminophen 500 MG tablet Commonly known as: TYLENOL Take 500 mg by mouth 3 (three) times daily.   albuterol 108 (90 Base) MCG/ACT inhaler Commonly known as: VENTOLIN HFA Inhale 2 puffs into the lungs every 6 (six) hours.   amLODipine 5 MG tablet Commonly known as: NORVASC Take 5 mg by mouth daily.   aspirin EC 81 MG tablet Take 81 mg by mouth daily.   dexamethasone 6 MG tablet Commonly known  as: DECADRON Take 1 tablet (6 mg total) by mouth daily for 3 days.   famotidine 40 MG tablet Commonly known as: PEPCID Take 40 mg by mouth daily.   galantamine 8 MG  tablet Commonly known as: RAZADYNE Take 8 mg by mouth daily.   melatonin 5 MG Tabs Take 1 tablet by mouth at bedtime.   pantoprazole 40 MG tablet Commonly known as: PROTONIX Take 40 mg by mouth daily.   senna 8.6 MG Tabs tablet Commonly known as: SENOKOT Take 1 tablet by mouth daily.   simvastatin 20 MG tablet Commonly known as: ZOCOR Take 20 mg by mouth at bedtime.   tamsulosin 0.4 MG Caps capsule Commonly known as: FLOMAX Take 0.4 mg by mouth daily.   traZODone 50 MG tablet Commonly known as: DESYREL Take 50 mg by mouth at bedtime.   VITAMIN D (ERGOCALCIFEROL) PO Take 5,000 Units by mouth daily.       Contact information for after-discharge care    Destination    [facility] Preferred SNF .   Service: Skilled Nursing Contact information: [address] [place] [address] [phone]                 No Known Allergies  Consultations:  None   Procedures/Studies: US Venous Img Lower Unilateral Right  Result Date: 03/14/2020 CLINICAL DATA:  Right leg swelling. EXAM: RIGHT LOWER EXTREMITY VENOUS DOPPLER ULTRASOUND TECHNIQUE: Gray-scale sonography with compression, as well as color and duplex ultrasound, were performed to evaluate the deep venous system(s) from the level of the common femoral vein through the popliteal and proximal calf veins. COMPARISON:  None. FINDINGS: VENOUS Normal compressibility of the common femoral, superficial femoral, and popliteal veins, as well as the visualized calf veins. Visualized portions of profunda femoral vein and great saphenous vein unremarkable. No filling defects to suggest DVT on grayscale or color Doppler imaging. Doppler waveforms show normal direction of venous flow, normal respiratory plasticity and response to augmentation. Limited views of the contralateral common femoral vein are unremarkable. OTHER None. Limitations: None. IMPRESSION: Negative. Electronically Signed   By: Obie Dredge M.D.   On: 03/14/2020 20:21   DG Chest Port 1 View  Result Date: 04/09/2020 CLINICAL DATA:  Weakness.  Hypertension.  Reported COVID-19 positive EXAM: PORTABLE CHEST 1 VIEW COMPARISON:  April 03, 2020 FINDINGS: Ill-defined airspace opacity is noted in the right mid lung and more subtly in the left mid lung and left base regions. No consolidation. Heart is upper normal in size with pulmonary vascularity normal. No adenopathy. There is aortic atherosclerosis. No bone lesions. IMPRESSION: Ill-defined airspace opacity right mid lung and more subtly in portions of the left mid lower lung regions. Suspect multifocal atypical organism pneumonia. No consolidation. Stable cardiac silhouette. No adenopathy evident. Aortic Atherosclerosis (ICD10-I70.0). Electronically Signed   By: Bretta Bang III M.D.   On: 04/09/2020 13:00   DG Chest Portable 1 View  Result Date: 04/03/2020 CLINICAL DATA:  COVID-19, hypoxia. EXAM: PORTABLE CHEST 1 VIEW COMPARISON:  03/14/2020 chest radiograph and prior. FINDINGS: No pneumothorax or pleural effusion. Hypoinflated lungs with patchy bibasilar opacities. Prominence of the cardiac silhouette, unchanged. No acute osseous abnormality. IMPRESSION: Patchy bibasilar opacities, atelectasis versus infiltrate. Electronically Signed   By: Stana Bunting M.D.   On: 04/03/2020 08:07   DG Chest Portable 1 View  Result Date: 03/14/2020 CLINICAL DATA:  Peripheral edema.  Leg swelling. EXAM: PORTABLE CHEST 1 VIEW COMPARISON:  02/05/2019 FINDINGS: Borderline cardiomegaly normal mediastinal  contours with aortic atherosclerosis. No pulmonary edema. No pleural effusion or pneumothorax. No focal airspace disease. Suspected retrocardiac hiatal hernia. No acute osseous abnormalities are seen. IMPRESSION: 1. Borderline cardiomegaly without pulmonary edema. 2. Suspected retrocardiac hiatal hernia. Electronically Signed   By: Narda Rutherford M.D.   On: 03/14/2020 20:04    (Echo,  Carotid, EGD, Colonoscopy, ERCP)    Subjective: Seen and examined on the day of discharge.  Sitting in bed, eating a banana.  Stable, no distress.  Stable for discharge to skilled nursing facility  Discharge Exam: Vitals:   04/11/20 0419 04/11/20 0841  BP: 130/66 114/62  Pulse: (!) 55 62  Resp: 18 20  Temp: 98.5 F (36.9 C) 98.5 F (36.9 C)  SpO2: 91% 95%   Vitals:   04/10/20 2011 04/11/20 0039 04/11/20 0419 04/11/20 0841  BP: (!) 123/59 130/71 130/66 114/62  Pulse: 70 66 (!) 55 62  Resp: Temp: 99 F (37.2 C) 98.1 F (36.7 C) 98.5 F (36.9 C) 98.5 F (36.9 C)  TempSrc:  Oral  Oral  SpO2: 95% 95% 91% 95%  Weight:      Height:        General: Pt is alert, awake, not in acute distress Cardiovascular: RRR, S1/S2 +, no rubs, no gallops Respiratory: CTA bilaterally, no wheezing, no rhonchi Abdominal: Soft, NT, ND, bowel sounds + Extremities: no edema, no cyanosis    The results of significant diagnostics from this hospitalization (including imaging, microbiology, ancillary and laboratory) are listed below for reference.     Microbiology: Recent Results (from the past 240 hour(s))  Blood culture (single)     Status: None   Collection Time: 04/03/20  7:27 AM   Specimen: BLOOD  Result Value Ref Range Status   Specimen Description BLOOD RIGHT [facility]  Final   Special Requests   Final    BOTTLES DRAWN AEROBIC AND ANAEROBIC Blood Culture results may not be optimal due to an excessive volume of blood received in culture bottles   Culture   Final    NO GROWTH 5 DAYS Performed at [facility], [address] [place], [place] [address]    Report Status 04/08/2020 FINAL  Final  Resp Panel by RT-PCR (Flu A&B, Covid) Nasopharyngeal Swab     Status: Abnormal   Collection Time: 04/05/20  9:08 AM   Specimen: Nasopharyngeal Swab; Nasopharyngeal(NP) swabs in vial transport medium  Result Value Ref Range Status   SARS Coronavirus 2 by RT PCR POSITIVE (A)  NEGATIVE Final    Comment: RESULT CALLED TO, READ BACK BY AND VERIFIED WITH: EMILY RUIZ 04/05/20 AT 1202 BY ACR (NOTE) SARS-CoV-2 target nucleic acids are DETECTED.  The SARS-CoV-2 RNA is generally detectable in upper respiratory specimens during the acute phase of infection. Positive results are indicative of the presence of the identified virus, but do not rule out bacterial infection or co-infection with other pathogens not detected by the test. Clinical correlation with patient history and other diagnostic information is necessary to determine patient infection status. The expected result is Negative.  Fact Sheet for Patients: BloggerCourse.com  Fact Sheet for Healthcare Providers: SeriousBroker.it  This test is not yet approved or cleared by the [place] FDA and  has been authorized for detection and/or diagnosis of SARS-CoV-2 by FDA under an Emergency Use Authorization (EUA).  This EUA will remain in effect (meaning this test can b e used) for the duration of  the COVID-19 declaration under Section 564(b)(1) of  the Act, 21 U.S.C. section 360bbb-3(b)(1), unless the authorization is terminated or revoked sooner.     Influenza A by PCR NEGATIVE NEGATIVE Final   Influenza B by PCR NEGATIVE NEGATIVE Final    Comment: (NOTE) The Xpert Xpress SARS-CoV-2/FLU/RSV plus assay is intended as an aid in the diagnosis of influenza from Nasopharyngeal swab specimens and should not be used as a sole basis for treatment. Nasal washings and aspirates are unacceptable for Xpert Xpress SARS-CoV-2/FLU/RSV testing.  Fact Sheet for Patients: BloggerCourse.comhttps://www.fda.gov/media/152166/download  Fact Sheet for Healthcare Providers: SeriousBroker.ithttps://www.fda.gov/media/152162/download  This test is not yet approved or cleared by the [place] FDA and has been authorized for detection and/or diagnosis of SARS-CoV-2 by FDA under an Emergency Use  Authorization (EUA). This EUA will remain in effect (meaning this test can be used) for the duration of the COVID-19 declaration under Section 564(b)(1) of the Act, 21 U.S.C. section 360bbb-3(b)(1), unless the authorization is terminated or revoked.  Performed at [facility], [address] [place], [place] [id number]      Labs: BNP (last 3 results) Recent Labs    03/14/20 1929 04/03/20 0726  BNP 43.8 105.5*   Basic Metabolic Panel: Recent Labs  Lab 04/05/20 0538 04/05/20 0538 04/06/20 0447 04/06/20 0447 04/07/20 0557 04/08/20 0720 04/09/20 0542 04/10/20 0457 04/11/20 0536  NA 139   < > 139   < > 135 130* 131* 133* 136  K 3.8   < > 4.3   < > 3.9 3.9 3.8 3.7 3.6  CL 104   < > 103   < > 102 97* 99 99 101  CO2 23   < > 21*   < > 22 21* 22 23 23   GLUCOSE 84   < > 130*   < > 102* 111* 107* 96 97  BUN 36*   < > 46*   < > 52* 42* 35* 34* 40*  CREATININE 1.70*   < > 1.94*   < > 1.83* 1.46* 1.27* 1.53* 1.67*  CALCIUM 8.8*   < > 8.8*   < > 8.3* 8.3* 8.2* 8.3* 8.4*  MG 1.8   < > 1.9   < > 1.9 2.2 2.0 2.2 2.6*  PHOS 2.9  --  5.1*  --  3.4 3.6  --   --   --    < > = values in this interval not displayed.   Liver Function Tests: Recent Labs  Lab 04/07/20 0557 04/08/20 0720 04/09/20 0542 04/10/20 0457 04/11/20 0536  AST 60* 52* 48* 67* 44*  ALT 43 41 38 37 39  ALKPHOS 50 50 54 54 61  BILITOT 0.6 0.8 0.9 0.7 0.8  PROT 6.4* 6.1* 6.2* 6.3* 6.9  ALBUMIN 2.9* 2.8* 2.8* 2.9* 3.0*   No results for input(s): LIPASE, AMYLASE in the last 168 hours. No results for input(s): AMMONIA in the last 168 hours. CBC: Recent Labs  Lab 04/05/20 0538 04/06/20 0447 04/07/20 0557 04/08/20 0720 04/11/20 0536  WBC 10.7* 7.8 12.9* 11.8* 11.4*  NEUTROABS 7.4 6.3 10.0* 9.0*  --   HGB 12.8* 13.1 12.2* 11.7* 13.2  HCT 38.5* 38.6* 38.7* 34.1* 38.8*  MCV 80.2 79.6* 84.7 78.9* 80.8  PLT 324 341 344 390 508*   Cardiac Enzymes: No results for input(s): CKTOTAL, CKMB, CKMBINDEX,  TROPONINI in the last 168 hours. BNP: Invalid input(s): POCBNP CBG: Recent Labs  Lab 04/06/20 0808  GLUCAP 120*   D-Dimer No results for input(s): DDIMER in the last 72 hours. Hgb  A1c No results for input(s): HGBA1C in the last 72 hours. Lipid Profile No results for input(s): CHOL, HDL, LDLCALC, TRIG, CHOLHDL, LDLDIRECT in the last 72 hours. Thyroid function studies No results for input(s): TSH, T4TOTAL, T3FREE, THYROIDAB in the last 72 hours.  Invalid input(s): FREET3 Anemia work up No results for input(s): VITAMINB12, FOLATE, FERRITIN, TIBC, IRON, RETICCTPCT in the last 72 hours. Urinalysis    Component Value Date/Time   COLORURINE YELLOW (A) 02/04/2019 2340   APPEARANCEUR CLEAR (A) 02/04/2019 2340   LABSPEC 1.012 02/04/2019 2340   PHURINE 5.0 02/04/2019 2340   GLUCOSEU NEGATIVE 02/04/2019 2340   HGBUR NEGATIVE 02/04/2019 2340   BILIRUBINUR NEGATIVE 02/04/2019 2340   KETONESUR NEGATIVE 02/04/2019 2340   PROTEINUR 100 (A) 02/04/2019 2340   NITRITE NEGATIVE 02/04/2019 2340   LEUKOCYTESUR NEGATIVE 02/04/2019 2340   Sepsis Labs Invalid input(s): PROCALCITONIN,  WBC,  LACTICIDVEN Microbiology Recent Results (from the past 240 hour(s))  Blood culture (single)     Status: None   Collection Time: 04/03/20  7:27 AM   Specimen: BLOOD  Result Value Ref Range Status   Specimen Description BLOOD RIGHT [facility]  Final   Special Requests   Final    BOTTLES DRAWN AEROBIC AND ANAEROBIC Blood Culture results may not be optimal due to an excessive volume of blood received in culture bottles   Culture   Final    NO GROWTH 5 DAYS Performed at [facility], [address] [place], [place] [address]    Report Status 04/08/2020 FINAL  Final  Resp Panel by RT-PCR (Flu A&B, Covid) Nasopharyngeal Swab     Status: Abnormal   Collection Time: 04/05/20  9:08 AM   Specimen: Nasopharyngeal Swab; Nasopharyngeal(NP) swabs in vial transport medium  Result Value Ref Range Status   SARS  Coronavirus 2 by RT PCR POSITIVE (A) NEGATIVE Final    Comment: RESULT CALLED TO, READ BACK BY AND VERIFIED WITH: EMILY RUIZ 04/05/20 AT 1202 BY ACR (NOTE) SARS-CoV-2 target nucleic acids are DETECTED.  The SARS-CoV-2 RNA is generally detectable in upper respiratory specimens during the acute phase of infection. Positive results are indicative of the presence of the identified virus, but do not rule out bacterial infection or co-infection with other pathogens not detected by the test. Clinical correlation with patient history and other diagnostic information is necessary to determine patient infection status. The expected result is Negative.  Fact Sheet for Patients: BloggerCourse.com  Fact Sheet for Healthcare Providers: SeriousBroker.it  This test is not yet approved or cleared by the [place] FDA and  has been authorized for detection and/or diagnosis of SARS-CoV-2 by FDA under an Emergency Use Authorization (EUA).  This EUA will remain in effect (meaning this test can b e used) for the duration of  the COVID-19 declaration under Section 564(b)(1) of the Act, 21 U.S.C. section 360bbb-3(b)(1), unless the authorization is terminated or revoked sooner.     Influenza A by PCR NEGATIVE NEGATIVE Final   Influenza B by PCR NEGATIVE NEGATIVE Final    Comment: (NOTE) The Xpert Xpress SARS-CoV-2/FLU/RSV plus assay is intended as an aid in the diagnosis of influenza from Nasopharyngeal swab specimens and should not be used as a sole basis for treatment. Nasal washings and aspirates are unacceptable for Xpert Xpress SARS-CoV-2/FLU/RSV testing.  Fact Sheet for Patients: BloggerCourse.com  Fact Sheet for Healthcare Providers: SeriousBroker.it  This test is not yet approved or cleared by the [place] FDA and has been authorized for detection and/or diagnosis  of SARS-CoV-2  by FDA under an Emergency Use Authorization (EUA). This EUA will remain in effect (meaning this test can be used) for the duration of the COVID-19 declaration under Section 564(b)(1) of the Act, 21 U.S.C. section 360bbb-3(b)(1), unless the authorization is terminated or revoked.  Performed at [facility], [address] [place], [place] [address]      Time coordinating discharge: Over 30 minutes  SIGNED:   Tresa Moore, MD  [facility] 04/11/2020, 10:34 AM Pager   If 7PM-7AM, please contact night-coverage

## 2020-04-11 NOTE — Care Management Important Message (Signed)
Important Message  Patient Details  Name: ANIRUDDH CIAVARELLA MRN: [id number] Date of Birth: 22-May-1929   Medicare Important Message Given:  Yes     Allayne Butcher, RN 04/11/2020, 10:47 AM

## 2020-04-11 NOTE — Progress Notes (Signed)
Occupational Therapy Treatment Patient Details Name: Paul Bryan MRN: [id number] DOB: [birth year] Today's Date: 04/11/2020    History of present illness Pt is an 86 y/o M admitted from [facility] on 04/[facility]/20 with c/c of weakness after being found on floor & too weak to get up. Pt tested positive for Covid 19 on 04/01/20. Pt admitted for tx of pneumonia due to covid 19. PMH: dementia, HTN, restless leg syndrome   OT comments  OT facilitates pt education in UB bathing with MIN A, LB bathing with MOD/MAX A with sit to stand. Pt requires CGA/SETUP with seated UB dressing and MIN A for seated LB dressing to don socks. MIN multimodal cues utilized throughout. Pt tolerates well. OT fixes bed so that chair alarm is used to notify nursing station if pt exits bed as built in alarm is not working/wired correctly. Maintenance aware. RN aware. Pt tolerates session well. Continue to recommend SNF.   Follow Up Recommendations  SNF    Equipment Recommendations  Other (comment) (defer)    Recommendations for Other Services      Precautions / Restrictions Precautions Precautions: Fall Precaution Comments: telesitter Restrictions Weight Bearing Restrictions: No       Mobility Bed Mobility Overal bed mobility: Needs Assistance Bed Mobility: Supine to Sit     Supine to sit: Min guard;Min assist;HOB elevated        Transfers Overall transfer level: Needs assistance Equipment used: Standard walker Transfers: Sit to/from Stand Sit to Stand: Min assist         General transfer comment: MIN verbal/tactile cues for safe hand placement with use of RW.    Balance Overall balance assessment: Needs assistance Sitting-balance support: Single extremity supported Sitting balance-Leahy Scale: Good     Standing balance support: Bilateral upper extremity supported Standing balance-Leahy Scale: Fair Standing balance comment: requires UE support on walker, does sway with attempts to alternate  hands for ADL tasks requiring MIN A to correct                           ADL either performed or assessed with clinical judgement   ADL Overall ADL's : Needs assistance/impaired     Grooming: Applying deodorant;Set up;Sitting Grooming Details (indicate cue type and reason): cuse to sequence Upper Body Bathing: Minimal assistance;Sitting Upper Body Bathing Details (indicate cue type and reason): EOB sitting, MIN visual/verbal cues to sequence Lower Body Bathing: Moderate assistance;Maximal assistance;Sit to/from stand Lower Body Bathing Details (indicate cue type and reason): with RW for balance, Pt able to balance with CGA, but requires MOD/MAX A for posterior LB bathing-thorough completion. Pt makes attempts, but loses balance if both UEs not supported on walker. Upper Body Dressing : Min guard;Sitting Upper Body Dressing Details (indicate cue type and reason): SETUP/orienting gown in correct way before handing it to patient Lower Body Dressing: Minimal assistance;Sitting/lateral leans Lower Body Dressing Details (indicate cue type and reason): to don socks             Functional mobility during ADLs: Minimal assistance;Rolling walker (to walk ~10' in room to chair and then 10' back to bed after resting.)       Vision Patient Visual Report: No change from baseline     Perception     Praxis      Cognition Arousal/Alertness: Awake/alert Behavior During Therapy: WFL for tasks assessed/performed Overall Cognitive Status: No family/caregiver present to determine baseline cognitive functioning  General Comments: Pt able to follow all simple one step commands with MIN multimodal cues. Pt appropriate, but not oriented. Minimal true conversation/interaction. Pleasantly confused.        Exercises Other Exercises Other Exercises: OT facilitates pt education in UB bathing with MIN A, LB bathing with MOD/MAX A with sit to  stand. Pt requires CGA/SETUP with seated UB dressing and MIN A for seated LB dressing to don socks. MIN multimodal cues utilized throughout. Pt tolerates well. OT fixes bed so that chair alarm is used to notify nursing station if pt exits bed as built in alarm is not working/wired correctly. Maintenance aware. RN aware. Pt tolerates session well. Continue to recommend SNF.   Shoulder Instructions       General Comments      Pertinent Vitals/ Pain       Pain Assessment: No/denies pain  Home Living                                          Prior Functioning/Environment              Frequency  Min 1X/week        Progress Toward Goals  OT Goals(current goals can now be found in the care plan section)  Progress towards OT goals: Progressing toward goals  Acute Rehab OT Goals Patient Stated Goal: To return home OT Goal Formulation: With patient Time For Goal Achievement: 04/20/20 Potential to Achieve Goals: Good  Plan      Co-evaluation                 AM-PAC OT "6 Clicks" Daily Activity     Outcome Measure   Help from another person eating meals?: A Little Help from another person taking care of personal grooming?: A Little Help from another person toileting, which includes using toliet, bedpan, or urinal?: A Lot Help from another person bathing (including washing, rinsing, drying)?: A Lot Help from another person to put on and taking off regular upper body clothing?: A Little Help from another person to put on and taking off regular lower body clothing?: A Lot 6 Click Score: 15    End of Session Equipment Utilized During Treatment: Gait belt;Rolling walker  OT Visit Diagnosis: Other abnormalities of gait and mobility (R26.89);Muscle weakness (generalized) (M62.81)   Activity Tolerance Patient tolerated treatment well   Patient Left in chair;with call bell/phone within reach;with nursing/sitter in room   Nurse Communication Mobility  status;Other (comment) (notified pt bathed/bed changed.)        Time: 8841-6606 OT Time Calculation (min): 58 min  Charges: OT General Charges $OT Visit: 1 Visit OT Treatments $Self Care/Home Management : 23-37 mins $Therapeutic Activity: 23-37 mins  Rejeana Brock, MS, OTR/L ascom [phone] 04/11/20, 11:01 AM

## 2020-04-11 NOTE — TOC Progression Note (Signed)
Transition of Care [facility]) - Progression Note    Patient Details  Name: Paul Bryan MRN: [id number] Date of Birth: 1930-04-22  Transition of Care [facility]) CM/SW Contact  Allayne Butcher, RN Phone Number: 04/11/2020, 11:14 AM  Clinical Narrative:    Transportation arranged with [facility]- patient is first on the list for pick up.    Expected Discharge Plan: Skilled Nursing Facility Barriers to Discharge: Barriers Resolved  Expected Discharge Plan and Services Expected Discharge Plan: Skilled Nursing Facility       Living arrangements for the past 2 months: Assisted Living Facility Expected Discharge Date: 04/11/20               DME Arranged: N/A DME Agency: NA       HH Arranged: NA           Social Determinants of Health (SDOH) Interventions    Readmission Risk Interventions Readmission Risk Prevention Plan 04/07/2020  Transportation Screening Complete  PCP or Specialist Appt within 3-5 Days Complete  HRI or Home Care Consult Complete  Social Work Consult for Recovery Care Planning/Counseling Complete  Palliative Care Screening Not Applicable  Medication Review Oceanographer) Complete  Some recent data might be hidden

## 2020-04-11 NOTE — TOC Transition Note (Signed)
Transition of Care [facility]) - CM/SW Discharge Note   Patient Details  Name: Paul Bryan MRN: [id number] Date of Birth: 04-29-1930  [facility]) CM/SW Contact:  Allayne Butcher, RN Phone Number: 04/11/2020, 10:13 AM   Clinical Narrative:    Patient is medically cleared for discharge to [facility] for short term rehab.  Patient's son [name], updated on discharge plan for today.  Patient will be going to room 805, bedside RN will call report to [phone].  Once discharge has been completed RNCM will arrange transport with [facility].    Final next level of care: Skilled Nursing Facility Barriers to Discharge: Barriers Resolved   Patient Goals and CMS Choice Patient states their goals for this hospitalization and ongoing recovery are:: SNF rehab CMS Medicare.gov Compare Post Acute Care list provided to:: Patient Represenative (must comment) Choice offered to / list presented to : Adult Children  Discharge Placement              Patient chooses bed at: [facility] [place] Patient to be transferred to facility by: [facility] Name of family member notified: [name] (son) Patient and family notified of of transfer: 04/11/20  Discharge Plan and Services                DME Arranged: N/A DME Agency: NA       HH Arranged: NA          Social Determinants of Health (SDOH) Interventions     Readmission Risk Interventions Readmission Risk Prevention Plan 04/07/2020  Transportation Screening Complete  PCP or Specialist Appt within 3-5 Days Complete  HRI or Home Care Consult Complete  Social Work Consult for Recovery Care Planning/Counseling Complete  Palliative Care Screening Not Applicable  Medication Review Oceanographer) Complete  Some recent data might be hidden

## 2020-04-11 NOTE — Progress Notes (Signed)
Gave report to [employer] at [facility], pt to transfer to room 805.  Report given at  [phone].  Kim verbalized no further questions.

## 2020-08-03 ENCOUNTER — Encounter: Payer: Self-pay | Admitting: Emergency Medicine

## 2020-08-03 ENCOUNTER — Emergency Department
Admission: EM | Admit: 2020-08-03 | Discharge: 2020-08-03 | Disposition: A | Discharge status: Home / Self Care | Payer: Medicare Other | Attending: Emergency Medicine | Admitting: Emergency Medicine

## 2020-08-03 ENCOUNTER — Other Ambulatory Visit: Payer: Self-pay

## 2020-08-03 DIAGNOSIS — Z7982 Long term (current) use of aspirin: Secondary | ICD-10-CM | POA: Insufficient documentation

## 2020-08-03 DIAGNOSIS — Z87891 Personal history of nicotine dependence: Secondary | ICD-10-CM | POA: Diagnosis not present

## 2020-08-03 DIAGNOSIS — I129 Hypertensive chronic kidney disease with stage 1 through stage 4 chronic kidney disease, or unspecified chronic kidney disease: Secondary | ICD-10-CM | POA: Diagnosis not present

## 2020-08-03 DIAGNOSIS — R456 Violent behavior: Secondary | ICD-10-CM | POA: Diagnosis present

## 2020-08-03 DIAGNOSIS — F0391 Unspecified dementia with behavioral disturbance: Secondary | ICD-10-CM | POA: Diagnosis not present

## 2020-08-03 DIAGNOSIS — Z8616 Personal history of COVID-19: Secondary | ICD-10-CM | POA: Insufficient documentation

## 2020-08-03 DIAGNOSIS — Z79899 Other long term (current) drug therapy: Secondary | ICD-10-CM | POA: Insufficient documentation

## 2020-08-03 DIAGNOSIS — N183 Chronic kidney disease, stage 3 unspecified: Secondary | ICD-10-CM | POA: Insufficient documentation

## 2020-08-03 NOTE — ED Notes (Signed)
Pt visualized getting into reagan, granddaughter's, car at this time. Pt in NAD at time of discharge. Pt unable to sign for self. Family and facility verbalized understanding of discharge instructions and follow up care. No further questions at this time.

## 2020-08-03 NOTE — ED Notes (Signed)
Reagan, granddaughter, coming to pick patient up.

## 2020-08-03 NOTE — ED Triage Notes (Signed)
Pt presents via acems with c/o aggressive behavior per staff at facility. Staff reported that patient poked a staff member and facility reported this was "odd behavior". Facility reports patient was attempting to go outside this am as well. Pt calm and cooperative upon arrival to ed.

## 2020-08-03 NOTE — ED Provider Notes (Signed)
[facility] Emergency Department Provider Note ____________________________________________   Event Date/Time   First MD Initiated Contact with Patient 08/03/20 1157     (approximate)  I have reviewed the triage vital signs and the nursing notes.  HISTORY  Chief Complaint Aggressive Behavior  EM caveat: Patient with dementia, much of the history is obtained from the patient's son as well as EMS HPI Paul Bryan is a 87 y.o. male here for evaluation for being aggressive at his memory care unit  Patient presents via EMS.  Reportedly had his memory care the patient was trying to get out, and "poked" staff member.  He was upset that they would not release him   Spoke with the patient's son who is also one of his medical decision makers, son advised me that both he and his brother lives further away on the medical decision makers, there is not unusual behavior for the father.  They report that with his dementia that we will frequently be wanting to leave the memory care unit, and sometimes does get upset when he will be released.  He had recent blood work and labs done with his primary care doctor just a few days ago.   Past Medical History:  Diagnosis Date  . Hypertension   . Restless leg syndrome     Patient Active Problem List   Diagnosis Date Noted  . Pneumonia due to COVID-19 virus 04/03/2020  . Hypertension   . CKD (chronic kidney disease), stage III (HCC)   . Dehydration   . BPH (benign prostatic hyperplasia)   . Acute respiratory failure (HCC)   . Altered mental status 02/05/2019  . GIB (gastrointestinal bleeding) 10/09/2017    Past Surgical History:  Procedure Laterality Date  . ABLATION    . APPENDECTOMY    . COLONOSCOPY N/A 10/10/2017   Procedure: COLONOSCOPY;  Surgeon: Wyline Mood, MD;  Location: [facility] ENDOSCOPY;  Service: Gastroenterology;  Laterality: N/A;  . ESOPHAGOGASTRODUODENOSCOPY N/A 10/10/2017   Procedure:  ESOPHAGOGASTRODUODENOSCOPY (EGD);  Surgeon: Wyline Mood, MD;  Location: [facility] ENDOSCOPY;  Service: Gastroenterology;  Laterality: N/A;  . FISSURECTOMY      Prior to Admission medications   Medication Sig Start Date End Date Taking? Authorizing Provider  acetaminophen (TYLENOL) 500 MG tablet Take 500 mg by mouth 3 (three) times daily.    [provider]  albuterol (VENTOLIN HFA) 108 (90 Base) MCG/ACT inhaler Inhale 2 puffs into the lungs every 6 (six) hours. 04/11/20   Lolita Patella B, MD  amLODipine (NORVASC) 5 MG tablet Take 5 mg by mouth daily. 07/26/19   [provider]  aspirin EC 81 MG tablet Take 81 mg by mouth daily.    [provider]  famotidine (PEPCID) 40 MG tablet Take 40 mg by mouth daily.    [provider]  galantamine (RAZADYNE) 8 MG tablet Take 8 mg by mouth daily.  09/08/17   [provider]  Melatonin 5 MG TABS Take 1 tablet by mouth at bedtime.    [provider]  pantoprazole (PROTONIX) 40 MG tablet Take 40 mg by mouth daily. 08/25/17   [provider]  senna (SENOKOT) 8.6 MG TABS tablet Take 1 tablet by mouth daily.    [provider]  simvastatin (ZOCOR) 20 MG tablet Take 20 mg by mouth at bedtime.  09/14/17   [provider]  tamsulosin (FLOMAX) 0.4 MG CAPS capsule Take 0.4 mg by mouth daily. 09/12/17   [provider]  traZODone (  DESYREL) 50 MG tablet Take 50 mg by mouth at bedtime.    [provider]  VITAMIN D, ERGOCALCIFEROL, PO Take 5,000 Units by mouth daily.    [provider]    Allergies Patient has no known allergies.  Family History  Family history unknown: Yes    Social History Social History   Tobacco Use  . Smoking status: Former Games developer  . Smokeless tobacco: Never Used    Review of Systems  Patient himself denies any concerns.  He reports that he lives in [place], he is not sure why he was brought to the hospital.  He would like to be able  to return home to his wife.  Denies any chest pain no headache does not hurt anywhere.  He has no medical complaints   ____________________________________________   PHYSICAL EXAM:  VITAL SIGNS: ED Triage Vitals  Enc Vitals Group     BP 08/03/20 1147 (!) 169/81     Pulse Rate 08/03/20 1147 67     Resp 08/03/20 1147 16     Temp 08/03/20 1147 98.3 F (36.8 C)     Temp Source 08/03/20 1147 Oral     SpO2 08/03/20 1147 98 %     Weight 08/03/20 1153 176 lb 9.4 oz (80.1 kg)     Height 08/03/20 1153 5\' 8"  (1.727 m)     Head Circumference --      [facility] Flow --      Pain Score 08/03/20 1152 0     Pain Loc --      Pain Edu? --      Excl. in GC? --     Constitutional: Alert and oriented to self and being in a hospital but believes the year to be about 2000 and that he lives in [place]. Well appearing and in no acute distress.  He is very pleasant he shows no signs of psychomotor agitation.  He is calm and very compliant with any requests Eyes: Conjunctivae are normal. Head: Atraumatic. Nose: No congestion/rhinnorhea. Mouth/Throat: Mucous membranes are moist. Neck: No stridor.  Cardiovascular: Normal rate, regular rhythm. Grossly normal heart sounds.  Good peripheral circulation. Respiratory: Normal respiratory effort.  No retractions. Lungs CTAB. Gastrointestinal: Soft and nontender. No distention. Musculoskeletal: No lower extremity tenderness nor edema. Neurologic:  Normal speech and language. No gross focal neurologic deficits are appreciated.  Skin:  Skin is warm, dry and intact. No rash noted. Psychiatric: Mood and affect are normal. Speech and behavior are normal.  He is again very calm pleasant noncombative nonaggressive.  Denies any desire to hurt himself or anyone else.  He seems rather shocked that I would even asking this question.  ____________________________________________   LABS (all labs ordered are listed, but only abnormal results are displayed)  Labs Reviewed -  No data to display ____________________________________________  EKG   ____________________________________________  RADIOLOGY   ____________________________________________   PROCEDURES  Procedure(s) performed: None  Procedures  Critical Care performed: No  ____________________________________________   INITIAL IMPRESSION / ASSESSMENT AND PLAN / ED COURSE  Pertinent labs & imaging results that were available during my care of the patient were reviewed by me and considered in my medical decision making (see chart for details).   The patient does not appear to be a risk to himself or others at this time.  He is not reported to have demonstrate any significant violent behavior at his memory care.  Per his son he has a longstanding history of wanting to leave the  unit and will sometimes get upset when they will not let him leave.  I suspect about the same happened today.  Reviewed with his son and he had a recent work-up with his primary care doctor and as part of that he had labs and urine test.  As a did review his records from his recent primary care visit and discussed with his son whether we should repeat labs and work-up, with shared medical decision making taking in context his son who reports as his healthcare decision maker and does not wish for [place] to evaluate with blood work and labs etc.  I think is quite reasonable.  He is calm compliant there is nothing in review of systems to suggest or the history and acute sudden change in medical condition.  Discussed with the son and they are comfortable returning him to his memory care, that would be his preferred disposition, and family will be coming to pick him up  Clinical Course as of 08/04/20 1229  Wynelle Link Aug 03, 2020  1229 Unable to reach the patient's son who is the listed emergency contact.  Voicemail full, unable to leave message [MQ]    Clinical Course User Index [MQ] Sharyn Creamer, MD      ____________________________________________   FINAL CLINICAL IMPRESSION(S) / ED DIAGNOSES  Final diagnoses:  Dementia with behavioral disturbance, unspecified dementia type [facility])        Note:  This document was prepared using Dragon voice recognition software and may include unintentional dictation errors       Sharyn Creamer, MD 08/04/20 1229

## 2021-01-07 ENCOUNTER — Emergency Department: Payer: Medicare Other

## 2021-01-07 ENCOUNTER — Emergency Department
Admission: EM | Admit: 2021-01-07 | Discharge: 2021-01-07 | Disposition: A | Discharge status: Home / Self Care | Payer: Medicare Other | Attending: Emergency Medicine | Admitting: Emergency Medicine

## 2021-01-07 DIAGNOSIS — Z7982 Long term (current) use of aspirin: Secondary | ICD-10-CM | POA: Insufficient documentation

## 2021-01-07 DIAGNOSIS — Z79899 Other long term (current) drug therapy: Secondary | ICD-10-CM | POA: Insufficient documentation

## 2021-01-07 DIAGNOSIS — R791 Abnormal coagulation profile: Secondary | ICD-10-CM | POA: Insufficient documentation

## 2021-01-07 DIAGNOSIS — Z87891 Personal history of nicotine dependence: Secondary | ICD-10-CM | POA: Insufficient documentation

## 2021-01-07 DIAGNOSIS — Z8616 Personal history of COVID-19: Secondary | ICD-10-CM | POA: Diagnosis not present

## 2021-01-07 DIAGNOSIS — N183 Chronic kidney disease, stage 3 unspecified: Secondary | ICD-10-CM | POA: Insufficient documentation

## 2021-01-07 DIAGNOSIS — G510 Bell's palsy: Secondary | ICD-10-CM | POA: Diagnosis not present

## 2021-01-07 DIAGNOSIS — Z20822 Contact with and (suspected) exposure to covid-19: Secondary | ICD-10-CM | POA: Diagnosis not present

## 2021-01-07 DIAGNOSIS — R29810 Facial weakness: Secondary | ICD-10-CM

## 2021-01-07 DIAGNOSIS — I129 Hypertensive chronic kidney disease with stage 1 through stage 4 chronic kidney disease, or unspecified chronic kidney disease: Secondary | ICD-10-CM | POA: Diagnosis not present

## 2021-01-07 LAB — URINALYSIS, ROUTINE W REFLEX MICROSCOPIC
Bacteria, UA: NONE SEEN
Bilirubin Urine: NEGATIVE
Glucose, UA: NEGATIVE mg/dL
Hgb urine dipstick: NEGATIVE
Ketones, ur: NEGATIVE mg/dL
Leukocytes,Ua: NEGATIVE
Nitrite: NEGATIVE
Protein, ur: 100 mg/dL — AB
Specific Gravity, Urine: 1.015 (ref 1.005–1.030)
Squamous Epithelial / HPF: NONE SEEN (ref 0–5)
pH: 6 (ref 5.0–8.0)

## 2021-01-07 LAB — COMPREHENSIVE METABOLIC PANEL
ALT: 15 U/L (ref 0–44)
AST: 21 U/L (ref 15–41)
Albumin: 3.7 g/dL (ref 3.5–5.0)
Alkaline Phosphatase: 51 U/L (ref 38–126)
Anion gap: 8 (ref 5–15)
BUN: 24 mg/dL — ABNORMAL HIGH (ref 8–23)
CO2: 27 mmol/L (ref 22–32)
Calcium: 9.3 mg/dL (ref 8.9–10.3)
Chloride: 105 mmol/L (ref 98–111)
Creatinine, Ser: 1.81 mg/dL — ABNORMAL HIGH (ref 0.61–1.24)
GFR, Estimated: 35 mL/min — ABNORMAL LOW (ref >60)
Glucose, Bld: 106 mg/dL — ABNORMAL HIGH (ref 70–99)
Potassium: 4.1 mmol/L (ref 3.5–5.1)
Sodium: 140 mmol/L (ref 135–145)
Total Bilirubin: 0.6 mg/dL (ref 0.3–1.2)
Total Protein: 6.6 g/dL (ref 6.5–8.1)

## 2021-01-07 LAB — DIFFERENTIAL
Abs Immature Granulocytes: 0.03 10*3/uL (ref 0.00–0.07)
Basophils Absolute: 0.1 10*3/uL (ref 0.0–0.1)
Basophils Relative: 1 %
Eosinophils Absolute: 0.1 10*3/uL (ref 0.0–0.5)
Eosinophils Relative: 2 %
Immature Granulocytes: 0 %
Lymphocytes Relative: 22 %
Lymphs Abs: 1.6 10*3/uL (ref 0.7–4.0)
Monocytes Absolute: 1.1 10*3/uL — ABNORMAL HIGH (ref 0.1–1.0)
Monocytes Relative: 15 %
Neutro Abs: 4.3 10*3/uL (ref 1.7–7.7)
Neutrophils Relative %: 60 %

## 2021-01-07 LAB — URINE DRUG SCREEN, QUALITATIVE (ARMC ONLY)
Amphetamines, Ur Screen: NOT DETECTED
Barbiturates, Ur Screen: NOT DETECTED
Benzodiazepine, Ur Scrn: NOT DETECTED
Cannabinoid 50 Ng, Ur ~~LOC~~: NOT DETECTED
Cocaine Metabolite,Ur ~~LOC~~: NOT DETECTED
MDMA (Ecstasy)Ur Screen: NOT DETECTED
Methadone Scn, Ur: NOT DETECTED
Opiate, Ur Screen: NOT DETECTED
Phencyclidine (PCP) Ur S: NOT DETECTED
Tricyclic, Ur Screen: NOT DETECTED

## 2021-01-07 LAB — CBC
HCT: 37.4 % — ABNORMAL LOW (ref 39.0–52.0)
Hemoglobin: 12.6 g/dL — ABNORMAL LOW (ref 13.0–17.0)
MCH: 28.8 pg (ref 26.0–34.0)
MCHC: 33.7 g/dL (ref 30.0–36.0)
MCV: 85.4 fL (ref 80.0–100.0)
Platelets: 269 10*3/uL (ref 150–400)
RBC: 4.38 MIL/uL (ref 4.22–5.81)
RDW: 15 % (ref 11.5–15.5)
WBC: 7.2 10*3/uL (ref 4.0–10.5)
nRBC: 0 % (ref 0.0–0.2)

## 2021-01-07 LAB — RESP PANEL BY RT-PCR (FLU A&B, COVID) ARPGX2
Influenza A by PCR: NEGATIVE
Influenza B by PCR: NEGATIVE
SARS Coronavirus 2 by RT PCR: NEGATIVE

## 2021-01-07 LAB — PROTIME-INR
INR: 1 (ref 0.8–1.2)
Prothrombin Time: 13.2 seconds (ref 11.4–15.2)

## 2021-01-07 LAB — CBG MONITORING, ED: Glucose-Capillary: 93 mg/dL (ref 70–99)

## 2021-01-07 LAB — APTT: aPTT: 31 seconds (ref 24–36)

## 2021-01-07 LAB — ETHANOL: Alcohol, Ethyl (B): <10 mg/dL (ref <10)

## 2021-01-07 MED ORDER — PREDNISONE 20 MG PO TABS
40.0000 mg | ORAL_TABLET | Freq: Once | ORAL | Status: AC
  Administered 2021-01-07: 40 mg via ORAL
  Filled 2021-01-07: qty 2

## 2021-01-07 MED ORDER — PREDNISONE 10 MG PO TABS: 40.0000 mg | ORAL_TABLET | Freq: Every day | ORAL | 0 refills | Status: DC

## 2021-01-07 MED ORDER — VALACYCLOVIR HCL 500 MG PO TABS
1000.0000 mg | ORAL_TABLET | ORAL | Status: AC
  Administered 2021-01-07: 1000 mg via ORAL
  Filled 2021-01-07: qty 2

## 2021-01-07 MED ORDER — VALACYCLOVIR HCL 1 G PO TABS: 1000.0000 mg | ORAL_TABLET | Freq: Three times a day (TID) | ORAL | 0 refills | Status: DC

## 2021-01-07 NOTE — ED Notes (Signed)
Patient transported to CT with Herbert Seta RN

## 2021-01-07 NOTE — ED Provider Notes (Signed)
[facility] Emergency Department Provider Note   ____________________________________________   Event Date/Time   First MD Initiated Contact with Patient 01/07/21 1232     (approximate)  I have reviewed the triage vital signs and the nursing notes.   HISTORY  Chief Complaint Facial Droop  EM caveat dementia  HPI Paul Bryan is a 87 y.o. male history of hypertension dementia  EM caveat  Noted to have drooping of his left side of his face occurred sometime around 9:30 AM admitted a eye doctor's appointment when it was noticed by his family.  Noted on the way back from the doctor's office to his assisted living.  Assisted living also noted left-sided facial droop.  Discussed with the patient's son who affirms the patient did not have this prior to the doctor's office visit which was routine eye exam  Patient denies headache denies any pain or symptoms patient reports that he is about ready to go home, but again noted is the patient's dementia.  He denies any shortness of breath trouble breathing chest pain or other concerns Past Medical History:  Diagnosis Date   Hypertension    Restless leg syndrome     Patient Active Problem List   Diagnosis Date Noted   Pneumonia due to COVID-19 virus 04/03/2020   Hypertension    CKD (chronic kidney disease), stage III (HCC)    Dehydration    BPH (benign prostatic hyperplasia)    Acute respiratory failure (HCC)    Altered mental status 02/05/2019   GIB (gastrointestinal bleeding) 10/09/2017    Past Surgical History:  Procedure Laterality Date   ABLATION     APPENDECTOMY     COLONOSCOPY N/A 10/10/2017   Procedure: COLONOSCOPY;  Surgeon: Wyline Mood, MD;  Location: [facility] ENDOSCOPY;  Service: Gastroenterology;  Laterality: N/A;   ESOPHAGOGASTRODUODENOSCOPY N/A 10/10/2017   Procedure: ESOPHAGOGASTRODUODENOSCOPY (EGD);  Surgeon: Wyline Mood, MD;  Location: [facility] ENDOSCOPY;  Service: Gastroenterology;  Laterality:  N/A;   FISSURECTOMY      Prior to Admission medications   Medication Sig Start Date End Date Taking? Authorizing Provider  acetaminophen (TYLENOL) 500 MG tablet Take 500 mg by mouth 3 (three) times daily.   Yes [provider]  albuterol (VENTOLIN HFA) 108 (90 Base) MCG/ACT inhaler Inhale 2 puffs into the lungs every 6 (six) hours. 04/11/20  Yes Sreenath, Sudheer B, MD  amLODipine (NORVASC) 5 MG tablet Take 5 mg by mouth daily. 07/26/19  Yes [provider]  aspirin EC 81 MG tablet Take 81 mg by mouth daily.   Yes [provider]  divalproex (DEPAKOTE ER) 250 MG 24 hr tablet Take 250 mg by mouth daily. 07/23/20  Yes [provider]  famotidine (PEPCID) 40 MG tablet Take 40 mg by mouth daily.   Yes [provider]  galantamine (RAZADYNE) 8 MG tablet Take 8 mg by mouth daily.  09/08/17  Yes [provider]  LORazepam (ATIVAN) 0.5 MG tablet Take 1 tablet by mouth every 4 (four) hours as needed. 10/17/20  Yes [provider]  Melatonin 5 MG TABS Take 1 tablet by mouth at bedtime.   Yes [provider]  Nutritional Supplements (BOOST PO) Take by mouth.   Yes [provider]  pantoprazole (PROTONIX) 40 MG tablet Take 40 mg by mouth daily. 08/25/17  Yes [provider]  predniSONE (DELTASONE) 10 MG tablet Take 4 tablets (40 mg total) by mouth daily. 01/08/21  Yes Sharyn Creamer, MD  senna (SENOKOT) 8.6  MG TABS tablet Take 1 tablet by mouth daily.   Yes [provider]  simvastatin (ZOCOR) 20 MG tablet Take 20 mg by mouth at bedtime.  09/14/17  Yes [provider]  tamsulosin (FLOMAX) 0.4 MG CAPS capsule Take 0.4 mg by mouth daily. 09/12/17  Yes [provider]  traZODone (DESYREL) 50 MG tablet Take 50 mg by mouth at bedtime.   Yes [provider]  valACYclovir (VALTREX) 1000 MG tablet Take 1 tablet (1,000 mg total) by mouth 3 (three) times daily. 01/07/21  Yes Sharyn CreamerQuale, Norberto Wishon, MD  VITAMIN D,  ERGOCALCIFEROL, PO Take 900 Units by mouth daily.   Yes [provider]    Allergies Patient has no known allergies.  Family History  Family history unknown: Yes    Social History Social History   Tobacco Use   Smoking status: Former   Smokeless tobacco: Never    Review of Systems  EM caveat, however son reports no known recent illness.  Patient did have COVID, has had pneumonia, urinary tract infections in the past and possibly had a small stroke in the distant past as well per the son.  The patient does not have any known deficits or a known left-sided facial droop however   ____________________________________________   PHYSICAL EXAM:  VITAL SIGNS: ED Triage Vitals  Enc Vitals Group     BP 01/07/21 1237 (!) 143/76     Pulse Rate 01/07/21 1237 65     Resp 01/07/21 1237 16     Temp 01/07/21 1237 98.1 F (36.7 C)     Temp Source 01/07/21 1237 Oral     SpO2 01/07/21 1237 97 %     Weight 01/07/21 1240 182 lb 15.7 oz (83 kg)     Height 01/07/21 1240 5\' 8"  (1.727 m)     Head Circumference --      Peak Flow --      Pain Score 01/07/21 1240 0     Pain Loc --      Pain Edu? --      Excl. in GC? --     Constitutional: Alert and oriented to self but reports his age to be in the 560s in the year to be about 2000. Well appearing and in no acute distress. Eyes: Conjunctivae are normal. Head: Atraumatic. Nose: No congestion/rhinnorhea. Mouth/Throat: Mucous membranes are moist. Neck: No stridor.  Cardiovascular: Normal rate, regular rhythm. Grossly normal heart sounds.  Good peripheral circulation. Respiratory: Normal respiratory effort.  No retractions. Lungs CTAB. Gastrointestinal: Soft and nontender. No distention. Musculoskeletal: No lower extremity tenderness nor edema. Neurologic:  Normal speech and language. No gross focal neurologic deficits are appreciated except for NIH score of 1 mild left-sided facial droop. VAN negative. Skin:  Skin is warm, dry and  intact. No rash noted. Psychiatric: Mood and affect are normal. Speech and behavior are normal.  ____________________________________________   LABS (all labs ordered are listed, but only abnormal results are displayed)  Labs Reviewed  CBC - Abnormal; Notable for the following components:      Result Value   Hemoglobin 12.6 (*)    HCT 37.4 (*)    All other components within normal limits  DIFFERENTIAL - Abnormal; Notable for the following components:   Monocytes Absolute 1.1 (*)    All other components within normal limits  COMPREHENSIVE METABOLIC PANEL - Abnormal; Notable for the following components:   Glucose, Bld 106 (*)    BUN 24 (*)    Creatinine, Ser  1.81 (*)    GFR, Estimated 35 (*)    All other components within normal limits  URINALYSIS, ROUTINE W REFLEX MICROSCOPIC - Abnormal; Notable for the following components:   Color, Urine YELLOW (*)    APPearance CLEAR (*)    Protein, ur 100 (*)    All other components within normal limits  RESP PANEL BY RT-PCR (FLU A&B, COVID) ARPGX2  PROTIME-INR  APTT  URINE DRUG SCREEN, QUALITATIVE ([facility] ONLY)  ETHANOL  CBG MONITORING, ED   ____________________________________________  EKG  Reviewed inter by me at 1232 Heart rate 60 Cures 100 QTc 430 Normal sinus rhythm, occasional PAC, borderline first-degree AV block ____________________________________________  RADIOLOGY  MR BRAIN WO CONTRAST  Result Date: 01/07/2021 CLINICAL DATA:  Acute neuro deficit.  Left facial droop.  Dementia. EXAM: MRI HEAD WITHOUT CONTRAST TECHNIQUE: Multiplanar, multiecho pulse sequences of the brain and surrounding structures were obtained without intravenous contrast. COMPARISON:  CT head 01/07/2021 FINDINGS: Brain: Negative for acute infarct.  Negative for hemorrhage or mass Generalized atrophy. Advanced atrophy in the temporal lobes bilaterally. Mild-to-moderate white matter changes with patchy periventricular deep white matter hyperintensity  bilaterally. Brainstem and cerebellum intact. Vascular: Normal arterial flow voids Skull and upper cervical spine: No focal skeletal lesion. Sinuses/Orbits: Mild mucosal edema paranasal sinuses. Bilateral cataract extraction Other: None IMPRESSION: Negative for acute infarct Marked temporal lobe atrophy which can be seen with Alzheimer's. Electronically Signed   By: Marlan Palau M.D.   On: 01/07/2021 14:16   CT HEAD CODE STROKE WO CONTRAST  Result Date: 01/07/2021 CLINICAL DATA:  Code stroke. Neuro deficit, acute, stroke suspected left facial droop EXAM: CT HEAD WITHOUT CONTRAST TECHNIQUE: Contiguous axial images were obtained from the base of the skull through the vertex without intravenous contrast. COMPARISON:  CT head 02/04/2019. FINDINGS: Brain: No evidence of acute infarction, hemorrhage, hydrocephalus, extra-axial collection or mass lesion/mass effect. Severe temporal lobe atrophy bilaterally with ex vacuo temporal horn dilation. Otherwise, atrophy is moderate. Scattered white matter hypodensities, nonspecific but compatible with chronic microvascular ischemic disease. Vascular: No hyperdense vessel identified. Calcific intracranial atherosclerosis. Skull: No acute fracture. Sinuses/Orbits: Clear sinuses. No acute findings in the visualized orbits. Other: No mastoid effusions. ASPECTS [facility] Stroke Program Early CT Score) total score (0-10 with 10 being normal): 10. IMPRESSION: 1. No evidence of acute large vascular territory infarct or acute hemorrhage.ASPECTS is 10. 2. Severe temporal lobe atrophy bilaterally, nonspecific but can be seen with Alzheimer's disease. Otherwise, atrophy is moderate. 3. Chronic microvascular ischemic disease. Code stroke imaging results were communicated on 01/07/2021 at 1:04 pm to provider Dr. Amada Jupiter via secure text paging. Electronically Signed   By: Feliberto Harts M.D.   On: 01/07/2021 13:09    CT head reviewed negative for acute finding.  MR BRAIN WO  CONTRAST  Result Date: 01/07/2021 CLINICAL DATA:  Acute neuro deficit.  Left facial droop.  Dementia. EXAM: MRI HEAD WITHOUT CONTRAST TECHNIQUE: Multiplanar, multiecho pulse sequences of the brain and surrounding structures were obtained without intravenous contrast. COMPARISON:  CT head 01/07/2021 FINDINGS: Brain: Negative for acute infarct.  Negative for hemorrhage or mass Generalized atrophy. Advanced atrophy in the temporal lobes bilaterally. Mild-to-moderate white matter changes with patchy periventricular deep white matter hyperintensity bilaterally. Brainstem and cerebellum intact. Vascular: Normal arterial flow voids Skull and upper cervical spine: No focal skeletal lesion. Sinuses/Orbits: Mild mucosal edema paranasal sinuses. Bilateral cataract extraction Other: None IMPRESSION: Negative for acute infarct Marked temporal lobe atrophy which can be seen with Alzheimer's. Electronically Signed  By: Marlan Palau M.D.   On: 01/07/2021 14:16   CT HEAD CODE STROKE WO CONTRAST  Result Date: 01/07/2021 CLINICAL DATA:  Code stroke. Neuro deficit, acute, stroke suspected left facial droop EXAM: CT HEAD WITHOUT CONTRAST TECHNIQUE: Contiguous axial images were obtained from the base of the skull through the vertex without intravenous contrast. COMPARISON:  CT head 02/04/2019. FINDINGS: Brain: No evidence of acute infarction, hemorrhage, hydrocephalus, extra-axial collection or mass lesion/mass effect. Severe temporal lobe atrophy bilaterally with ex vacuo temporal horn dilation. Otherwise, atrophy is moderate. Scattered white matter hypodensities, nonspecific but compatible with chronic microvascular ischemic disease. Vascular: No hyperdense vessel identified. Calcific intracranial atherosclerosis. Skull: No acute fracture. Sinuses/Orbits: Clear sinuses. No acute findings in the visualized orbits. Other: No mastoid effusions. ASPECTS [facility] Stroke Program Early CT Score) total score (0-10 with 10 being  normal): 10. IMPRESSION: 1. No evidence of acute large vascular territory infarct or acute hemorrhage.ASPECTS is 10. 2. Severe temporal lobe atrophy bilaterally, nonspecific but can be seen with Alzheimer's disease. Otherwise, atrophy is moderate. 3. Chronic microvascular ischemic disease. Code stroke imaging results were communicated on 01/07/2021 at 1:04 pm to provider Dr. Amada Jupiter via secure text paging. Electronically Signed   By: Feliberto Harts M.D.   On: 01/07/2021 13:09       MRI brain reviewed negative for acute finding. ____________________________________________   PROCEDURES  Procedure(s) performed: None  Procedures  Critical Care performed: Yes, see critical care note(s)  CRITICAL CARE Performed by: Sharyn Creamer   Total critical care time: 25 minutes  Critical care time was exclusive of separately billable procedures and treating other patients.  Critical care was necessary to treat or prevent imminent or life-threatening deterioration.  Critical care was time spent personally by me on the following activities: development of treatment plan with patient and/or surrogate as well as nursing, discussions with consultants, evaluation of patient's response to treatment, examination of patient, obtaining history from patient or surrogate, ordering and performing treatments and interventions, ordering and review of laboratory studies, ordering and review of radiographic studies, pulse oximetry and re-evaluation of patient's condition.  ____________________________________________   INITIAL IMPRESSION / ASSESSMENT AND PLAN / ED COURSE  Pertinent labs & imaging results that were available during my care of the patient were reviewed by me and considered in my medical decision making (see chart for details).   Patient presents with left-sided facial droop on exam he does have left-sided facial droop suggestive of potentially either incomplete Bell's palsy as he is able to  wrinkle his forehead, or potentially stroke.  Less likely is felt to be tumor mass or acute metabolic or other etiology.  Reassuring examination.  He has no evidence to suggest a severe stroke and his symptoms are very mild and after being seen by our stroke team decision is made not to proceed with any sort of thrombolytic but rather to obtain MRI of the brain.  Based on the mildness of symptoms he would not be a thrombolytic candidate, and I am in agreement  Last known well was about 9:30 AM.  Discussed case and care with the family.  Patient in no acute distress.  Based on my clinical assessment I do suspect this may well be a Bell's palsy, though it would be somewhat sparing of weakness of the forehead.  Will obtain MRI to further evaluate  Clinical Course as of 01/07/21 1504  Wed Jan 07, 2021  1240 Noted to have some facial drooping around 0930 at a doctor's office.  Was not present on way to doctor, but was noted while at [facility] office today. This would have been roughly 0930.  [MQ]    Clinical Course User Index [MQ] Sharyn Creamer, MD   ----------------------------------------- 3:05 PM on 01/07/2021 ----------------------------------------- Patient resting comfortably symptoms stable.  Mild left-sided facial droop notably in the left lower face.  Able to close the eye.  MRI is negative, have discussed case with Dr. Amada Jupiter of neurology advises and recommends treatment for Bell's palsy which I am in agreement with.  I called and discussed the case with the patient's son Paul Bryan, and he advises agreeable with treatment plan follow-up and will be coming to pick patient up to return him to his care facility.    ____________________________________________   FINAL CLINICAL IMPRESSION(S) / ED DIAGNOSES  Final diagnoses:  Left-sided Bell's palsy        Note:  This document was prepared using Dragon voice recognition software and may include unintentional dictation  errors       Sharyn Creamer, MD 01/07/21 1505

## 2021-01-07 NOTE — ED Notes (Signed)
Patients son [name] picking up patient from ER.

## 2021-01-07 NOTE — ED Notes (Signed)
Neurologist and Dr. Fanny Bien at bedside to re eval

## 2021-01-07 NOTE — ED Notes (Signed)
Patient returned from CT

## 2021-01-07 NOTE — ED Notes (Signed)
Neurology at bedside.

## 2021-01-07 NOTE — Code Documentation (Signed)
Arrived to room 26 after code stroke was called, pt to ED via ems was seen by EDP and RN and noted to have some left sided facial droop. Pt LKW was approx 0930 after he went to the eye doctor.Dr Amada Jupiter to bedside at 1242 with this RN. Pt was taken to CT by neuro and this RN and returned back to room 26.

## 2021-01-07 NOTE — Consult Note (Signed)
Neurology Consultation Reason for Consult: facial weakness Referring Physician: Quale, M  CC: "I don't know what's wrong"  History is obtained from:Patient, facility  HPI: Paul Bryan is a 87 y.o. male who was in his normal state of health this morning.  He went to the eye doctor and had some drops but in his eyes, and then at some point thereafter his family noticed that he started having a little bit of trouble with facial weakness.  This then progressed and he was having trouble closing his eye as well.  Due to this new finding, he was brought into the emergency department where a code stroke was activated.  He was evaluated, but with his mild symptoms I did not feel he was a tPA candidate.   LKW: 9:30 AM tpa given?: no, mild symptoms   ROS: A 14 point ROS was performed and is negative except as noted in the HPI.  Past Medical History:  Diagnosis Date   Hypertension    Restless leg syndrome      Family History  Family history unknown: Yes     Social History:  reports that he has quit smoking. He has never used smokeless tobacco. No history on file for alcohol use and drug use.   Exam: Current vital signs: BP (!) 179/80   Pulse (!) 58   Temp 98.1 F (36.7 C) (Oral)   Resp 15   Ht 5\' 8"  (1.727 m)   Wt 83 kg   SpO2 97%   BMI 27.82 kg/m  Vital signs in last 24 hours: Temp:  [98.1 F (36.7 C)] 98.1 F (36.7 C) (08/31 1237) Pulse Rate:  [58-65] 58 (08/31 1321) Resp:  [15-16] 15 (08/31 1321) BP: (143-179)/(76-80) 179/80 (08/31 1321) SpO2:  [97 %] 97 % (08/31 1321) Weight:  [83 kg] 83 kg (08/31 1240)   Physical Exam  Constitutional: Appears well-developed and well-nourished.  Psych: Affect appropriate to situation Eyes: No scleral injection HENT: No OP obstruction MSK: no joint deformities.  Cardiovascular: Normal rate and regular rhythm.  Respiratory: Effort normal, non-labored breathing GI: Soft.  No distension. There is no tenderness.  Skin:  WDI  Neuro: Mental Status: Patient is awake, alert, oriented to person, he is unable to give the month or his age ([birth year] and 27) No signs of aphasia or neglect Cranial Nerves: II: Visual Fields are full. Pupils are fixed bilaterally(recently pharmacologically dilated) III,IV, VI: EOMI without ptosis or diploplia.  V: Facial sensation is symmetric to temperature VII: He has mild left facial weakness, he is able to for his brow bilaterally, but at rest I feel there is a decreased in the depth of the furrowing on the left, he has clear weakness of eye closure on the left VIII: hearing is intact to voice X: Uvula elevates symmetrically XI: Shoulder shrug is symmetric. XII: tongue is midline without atrophy or fasciculations.  Motor: Tone is normal. Bulk is normal. 5/5 strength was present in all four extremities.  Sensory: Sensation is symmetric to light touch and temperature in the arms and legs. Cerebellar: FNF and HKS are intact bilaterally   I have reviewed labs in epic and the results pertinent to this consultation are: Creatinine 1.8  I have reviewed the images obtained: CT head-negative other than atrophy  Impression: 87 year old male with left facial weakness.  I do feel there may be some involvement of the forehead, and therefore I think that this could be a peripheral seventh, but this is not definite.  I  do think that an MRI would be helpful.  If this is negative, then I would favor treating this as a Bell's palsy, but if positive he will need to be admitted for stroke work-up.  Recommendations: 1) MRI brain 2) stroke work-up if positive, if negative would treat with valacyclovir(renally adjusted) and prednisone   Ritta Slot, MD [facility] [phone]  If 7pm- 7am, please page neurology on call as listed in AMION.

## 2021-01-07 NOTE — ED Triage Notes (Signed)
Patient BIBA from [facility] assisted living. Hx of dementia. Staff noted left sided facial droop. Patient was last normal at 0930 at eye doctor appointment. Patient oriented to self. Equal grip strength in hands and equal strength in feet.

## 2021-01-07 NOTE — ED Notes (Signed)
CODE  STROKE  CALLED  TO  [facility]  PER  DR  Fanny Bien  MD

## 2021-01-07 NOTE — ED Notes (Signed)
Dr. Quale at bedside.  

## 2021-01-07 NOTE — ED Notes (Signed)
Patient transported to MRI 

## 2022-02-28 ENCOUNTER — Emergency Department: Payer: Medicare Other

## 2022-02-28 ENCOUNTER — Emergency Department
Admission: EM | Admit: 2022-02-28 | Discharge: 2022-02-28 | Disposition: A | Discharge status: Home / Self Care | Payer: Medicare Other | Attending: Emergency Medicine | Admitting: Emergency Medicine

## 2022-02-28 ENCOUNTER — Other Ambulatory Visit: Payer: Self-pay

## 2022-02-28 DIAGNOSIS — M1711 Unilateral primary osteoarthritis, right knee: Secondary | ICD-10-CM | POA: Insufficient documentation

## 2022-02-28 DIAGNOSIS — N183 Chronic kidney disease, stage 3 unspecified: Secondary | ICD-10-CM | POA: Insufficient documentation

## 2022-02-28 DIAGNOSIS — I129 Hypertensive chronic kidney disease with stage 1 through stage 4 chronic kidney disease, or unspecified chronic kidney disease: Secondary | ICD-10-CM | POA: Insufficient documentation

## 2022-02-28 DIAGNOSIS — M25561 Pain in right knee: Secondary | ICD-10-CM | POA: Diagnosis present

## 2022-02-28 IMAGING — DX DG CHEST 1V PORT
1 series · 1 of 1 positions shown · non-contrast
Comparison: 02/05/2019

CLINICAL DATA: Peripheral edema.  Leg swelling.

EXAM:
PORTABLE CHEST 1 VIEW

[chest ap]
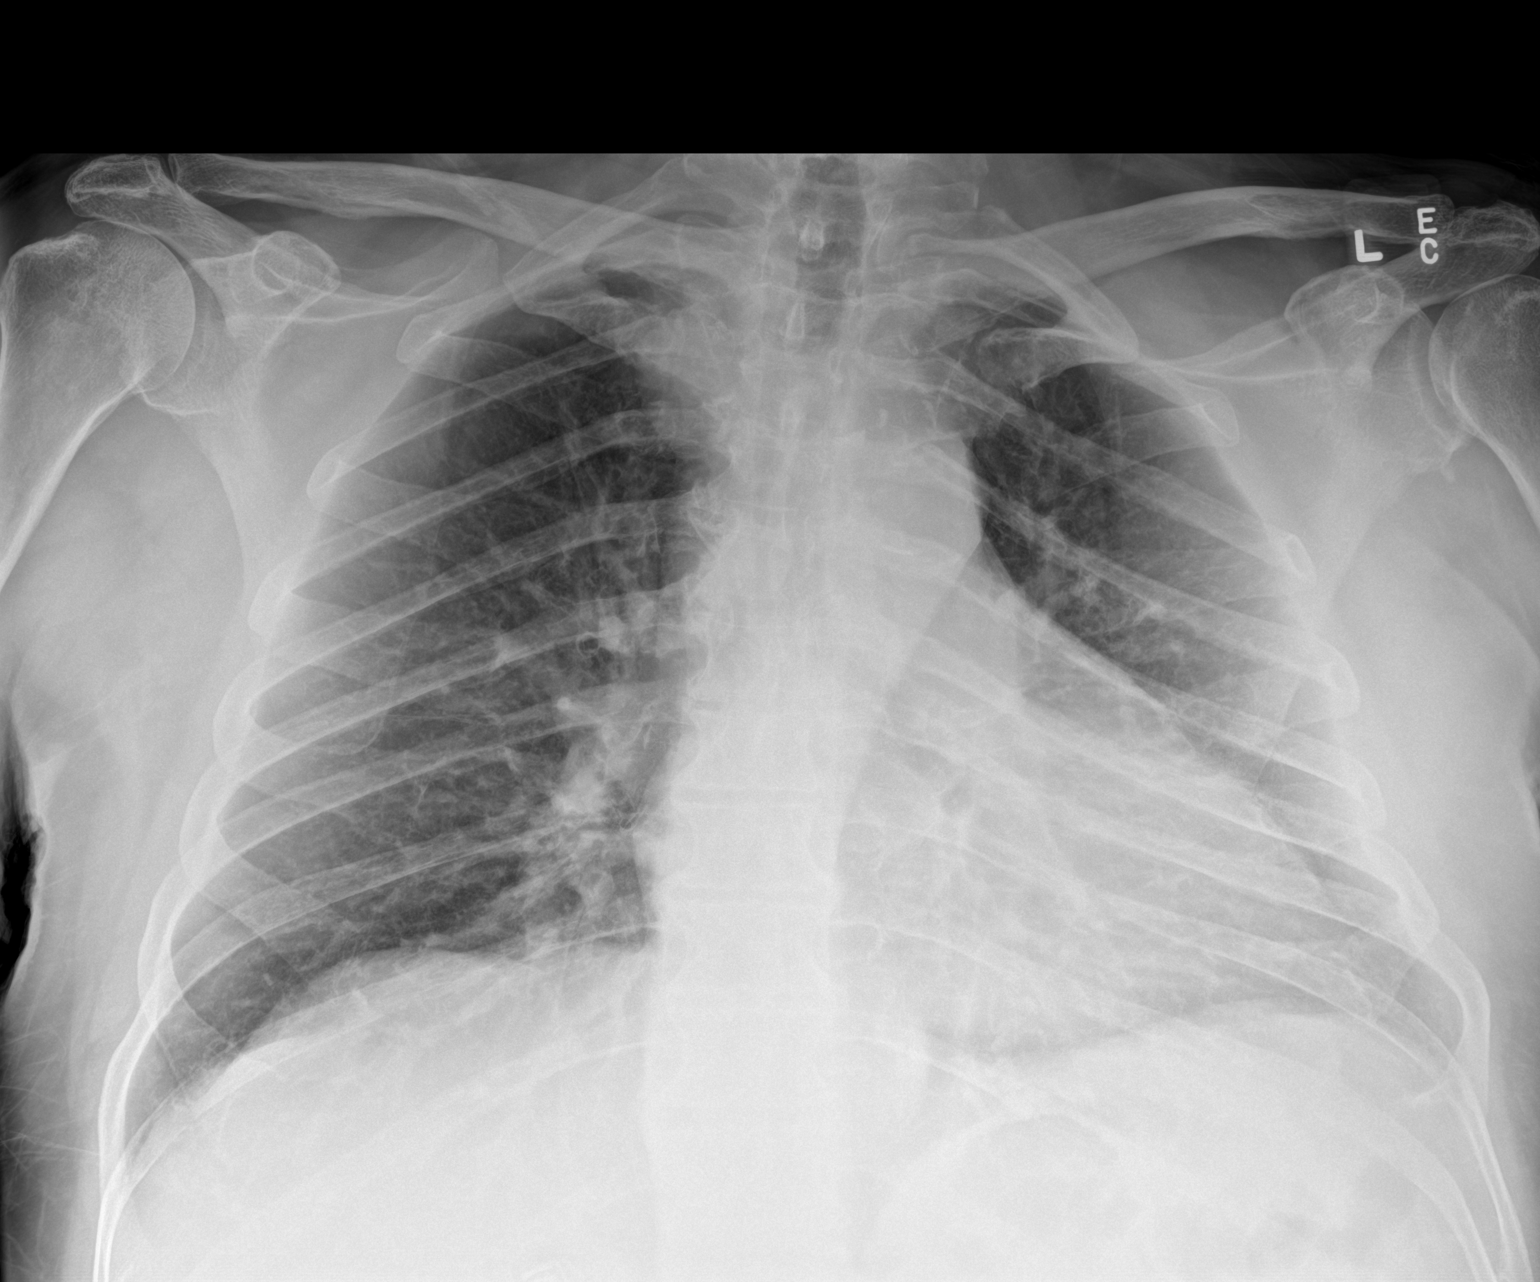

[1 of 1 positions shown; findings below may reference images not displayed]

FINDINGS: Borderline cardiomegaly normal mediastinal contours with aortic
atherosclerosis. No pulmonary edema. No pleural effusion or
pneumothorax. No focal airspace disease. Suspected retrocardiac
hiatal hernia. No acute osseous abnormalities are seen.
IMPRESSION: 1. Borderline cardiomegaly without pulmonary edema.
2. Suspected retrocardiac hiatal hernia.

## 2022-02-28 NOTE — ED Provider Notes (Signed)
[facility] Provider Note    Event Date/Time   First MD Initiated Contact with Patient 02/28/22 1704     (approximate)   History   Knee Pain   HPI  Paul Bryan is a 88 y.o. male who presents today for evaluation of right-sided knee pain.  Patient reports that his pain has resolved.  Paul Bryan called EMS because he was complaining of pain.  There was no noted trauma.  There has not been any redness, warmth, erythema, or injuries.  No fevers or chills.  He has been able to walk.  Patient has been at his baseline.  No abdominal pain, chest pain, shortness of breath, weakness, changes in mental status, fevers, redness, injuries, or any other concerns today.  Patient Active Problem List   Diagnosis Date Noted   Pneumonia due to COVID-19 virus 04/03/2020   Hypertension    CKD (chronic kidney disease), stage III (HCC)    Dehydration    BPH (benign prostatic hyperplasia)    Acute respiratory failure (HCC)    Altered mental status 02/05/2019   GIB (gastrointestinal bleeding) 10/09/2017          Physical Exam   Triage Vital Signs: ED Triage Vitals  Enc Vitals Group     BP 02/28/22 1617 (!) 178/83     Pulse Rate 02/28/22 1617 65     Resp 02/28/22 1617 16     Temp 02/28/22 1617 98 F (36.7 C)     Temp Source 02/28/22 1617 Oral     SpO2 02/28/22 1617 99 %     Weight 02/28/22 1618 182 lb 15.7 oz (83 kg)     Height --      Head Circumference --      Peak Flow --      Pain Score 02/28/22 1618 0     Pain Loc --      Pain Edu? --      Excl. in [place]? --     Most recent vital signs: Vitals:   02/28/22 1617  BP: (!) 178/83  Pulse: 65  Resp: 16  Temp: 98 F (36.7 C)  SpO2: 99%    Physical Exam Vitals and nursing note reviewed.  Constitutional:      General: Awake and alert. No acute distress.    Appearance: Normal appearance. The patient is normal weight.  HENT:     Head: Normocephalic and atraumatic.     Mouth: Mucous membranes are  moist.  Eyes:     General: PERRL. Normal EOMs        Right eye: No discharge.        Left eye: No discharge.     Conjunctiva/sclera: Conjunctivae normal.  Cardiovascular:     Rate and Rhythm: Normal rate and regular rhythm.     Pulses: Normal pulses.  Pulmonary:     Effort: Pulmonary effort is normal. No respiratory distress.  No chest wall tenderness or evidence of injury Abdominal:     Abdomen is soft. There is no abdominal tenderness. No rebound or guarding. No distention.  No ecchymosis. Musculoskeletal:        General: No swelling. Normal range of motion.     Cervical back: Normal range of motion and neck supple.  Bilateral knees: No deformity or rash. No joint line tenderness. No patellar tenderness, no ballotment Warm and well perfused extremity with 2+ pedal pulses 5/5 strength to dorsiflexion and plantarflexion at the ankle with intact sensation throughout extremity  Normal range of motion of the knee, with intact flexion and extension to active and passive range of motion. Extensor mechanism intact. No ligamentous laxity. Negative anterior/posterior drawer/negative lachman, negative mcmurrays No effusion or warmth Intact quadriceps, hamstring function, patellar tendon function Pelvis stable Full ROM of ankle without pain or swelling Foot warm and well perfused Pelvis stable.  Negative logroll to bilateral hips.  Able to actively flex and extend bilateral hips without pain, able to lift both legs up off of the stretcher. Skin:    General: Skin is warm and dry.     Capillary Refill: Capillary refill takes less than 2 seconds.     Findings: No rash.  Neurological:     Mental Status: The patient is awake and alert.      ED Results / Procedures / Treatments   Labs (all labs ordered are listed, but only abnormal results are displayed) Labs Reviewed - No data to display   EKG     RADIOLOGY I independently reviewed and interpreted imaging and agree with radiologists  findings.     PROCEDURES:  Critical Care performed:   Procedures   MEDICATIONS ORDERED IN ED: Medications - No data to display   IMPRESSION / MDM / [place] / ED COURSE  I reviewed the triage vital signs and the nursing notes.   Differential diagnosis includes, but is not limited to, osteoarthritis, effusion, sprain, contusion, dislocation, fracture, joint infection, tendon rupture. No evidence of neurological deficit or vascular compromise on exam. No fracture/dislocation on X-Ray.  X-ray does reveal severe patellofemoral osteoarthritis of the knee.  X-ray hip obtained to ensure that this is not referred pain from the hip injury and this was also negative.  No deformity or obvious ligamentous laxity on exam. No constitutional symptoms, warmth, erythema, or effusion to suggest septic joint.  He has full active and passive range of motion of his knee without pain.  No history of immunosuppression. Overall well appearing, vital signs stable. No indication for diagnostic or therapeutic procedure such as arthrocentesis.  No unilateral leg swelling or calf tenderness to suggest DVT.  He was given a knee brace for extra support.  We discussed that the brace should be worn during the day and removed at night to reduce the risk of developing a blood clot, and this was also discussed with patient's daughter and also written on the patient's paperwork so that the care home will no.  We discussed the importance of outpatient follow-up with orthopedics for continued management.   At the time of discharge, the daughter was concerned that we had x-rayed the wrong knee.  Patient is quite adamant that his right knee is 1 that is bothering him and this is when he keeps pointing to.  He is able to ambulate unassisted.  His abdomen is completely soft and nontender, there are no skin changes, ecchymosis, swelling, abnormalities noted to bilateral lower extremities.  Given the question of his pain and  his dementia, and the fact that his daughter does not live with him, I recommended labs, and further imaging.  However, on examination again, patient has no pain anywhere reports that he feels normal, when walking around, he currently also does not have any pain.  Daughter does not wish to undergo further work-up or imaging at this point, but we did discuss strict return precautions.  She plans to follow-up with his outpatient provider as well if his symptoms return or persist.  Patient is currently quite well-appearing and without  any complaints.  He is at his baseline according to the daughter.  He is ambulatory with a steady and unassisted gait.  Return precautions and care instructions discussed. Outpatient follow-up advised. Patient agrees with plan of care.   Patient's presentation is most consistent with acute complicated illness / injury requiring diagnostic workup.    FINAL CLINICAL IMPRESSION(S) / ED DIAGNOSES   Final diagnoses:  Osteoarthritis of right knee, unspecified osteoarthritis type     Rx / DC Orders   ED Discharge Orders     None        Note:  This document was prepared using Dragon voice recognition software and may include unintentional dictation errors.   Emeline Gins 02/28/22 1908    Rada Hay, MD 02/28/22 [phone]

## 2022-02-28 NOTE — ED Triage Notes (Signed)
Pt in via EMS from [facility] as[facility] living [facility] unit with c/o left leg pain. Pt hasno complaints at this time. Per facility he yelled about it earlier and they want him checked out. 144/69 hR 66, 96% RA

## 2022-02-28 NOTE — Discharge Instructions (Signed)
You may wear the knee brace during the day, please take it off at night to reduce your risk for developing blood clots.  You may continue to take Tylenol 650 mg every 6-8 hours as needed for pain.  Your x-rays show arthritis but no broken bones.  Please return to the emergency department immediately if you develop redness, warmth, inability to move your knee, inability to bear weight, fevers, chills, or any other concerns.  Please follow-up with orthopedics for continued management of arthritis.  Please continue your physical therapy and your Occupational Therapy.  It was a pleasure caring for you today.

## 2022-02-28 NOTE — ED Triage Notes (Signed)
Pt presents via EMS c/o right knee pain. Hx dementia. Pt denies knee pain at this time.

## 2022-02-28 NOTE — ED Notes (Signed)
[place] and gave report to Laverle Patter that pt is being discharged, daughter will be taking him back to the facility and that we put a knee brace on the patient.

## 2022-02-28 NOTE — ED Provider Triage Note (Signed)
Emergency Medicine Provider Triage Evaluation Note  NYMIR RINGLER , a 88 y.o. male  was evaluated in triage.  Pt complains of right knee pain x1 day. No trauma. [facility] called EMS because he was "screaming". Patient denies pain currently. No fever.  Review of Systems  Positive: Knee pain Negative: fever  Physical Exam  BP (!) 178/83   Pulse 65   Temp 98 F (36.7 C) (Oral)   Resp 16   SpO2 99%  Gen:   Awake, no distress   Resp:  Normal effort  MSK:   Moves extremities without difficulty  Other:  Right knee without erythema, normal AROM and PROM  Medical Decision Making  Medically screening exam initiated at 4:18 PM.  Appropriate orders placed.  NYKOLAS BACALLAO was informed that the remainder of the evaluation will be completed by another provider, this initial triage assessment does not replace that evaluation, and the importance of remaining in the ED until their evaluation is complete.     Marquette Old, PA-C 02/28/22 1621

## 2022-05-04 ENCOUNTER — Emergency Department: Payer: Medicare Other

## 2022-05-04 ENCOUNTER — Other Ambulatory Visit: Payer: Self-pay

## 2022-05-04 ENCOUNTER — Emergency Department
Admission: EM | Admit: 2022-05-04 | Discharge: 2022-05-04 | Disposition: A | Discharge status: Skilled Nursing Facility | Payer: Medicare Other | Attending: Emergency Medicine | Admitting: Emergency Medicine

## 2022-05-04 DIAGNOSIS — W19XXXA Unspecified fall, initial encounter: Secondary | ICD-10-CM | POA: Insufficient documentation

## 2022-05-04 DIAGNOSIS — S0990XA Unspecified injury of head, initial encounter: Secondary | ICD-10-CM | POA: Diagnosis present

## 2022-05-04 DIAGNOSIS — I10 Essential (primary) hypertension: Secondary | ICD-10-CM | POA: Insufficient documentation

## 2022-05-04 DIAGNOSIS — Z7982 Long term (current) use of aspirin: Secondary | ICD-10-CM | POA: Insufficient documentation

## 2022-05-04 DIAGNOSIS — S0083XA Contusion of other part of head, initial encounter: Secondary | ICD-10-CM | POA: Diagnosis not present

## 2022-05-04 DIAGNOSIS — F0390 Unspecified dementia without behavioral disturbance: Secondary | ICD-10-CM | POA: Diagnosis not present

## 2022-05-04 NOTE — ED Notes (Signed)
Attempted report to [facility] multiple times without answer by facility. Pt left ed by ems.

## 2022-05-04 NOTE — ED Provider Triage Note (Signed)
Emergency Medicine Provider Triage Evaluation Note  Paul Bryan , a 88 y.o. male  was evaluated in triage.  Pt complains of unwitnessed fall.  Review of Systems  Positive: Unwitnessed fall Negative: Chest pain  Physical Exam  There were no vitals taken for this visit. Gen:   Awake, no distress   Resp:  Normal effort  MSK:   Moves extremities without difficulty  Other:  Left forehead hematoma  Medical Decision Making  Medically screening exam initiated at 1:28 AM.  Appropriate orders placed.  Paul Bryan was informed that the remainder of the evaluation will be completed by another provider, this initial triage assessment does not replace that evaluation, and the importance of remaining in the ED until their evaluation is complete.  88 year old male presenting with unwitnessed fall at nursing facility.  Hematoma to forehead noted.  Will obtain EKG, CT head and cervical spine while patient awaits treatment room.   Irean Hong, MD 05/04/22 [phone]

## 2022-05-04 NOTE — ED Notes (Signed)
[facility] to transport pt back to [facility] memory care

## 2022-05-04 NOTE — ED Provider Notes (Signed)
[facility] Provider Note    None    (approximate)   History   Fall   HPI  Level V caveat: Limited by dementia  Paul Bryan is a 88 y.o. male brought to the ED via EMS from memory unit status post unwitnessed fall.  Presents with hematoma to left forehead.  Found on the floor by staff.  Patient does not know what happened and voices no medical complaints.  Rest of history is limited secondary to dementia.     Past Medical History   Past Medical History:  Diagnosis Date  . Hypertension   . Restless leg syndrome      Active Problem List   Patient Active Problem List   Diagnosis Date Noted  . Pneumonia due to COVID-19 virus 04/03/2020  . Hypertension   . CKD (chronic kidney disease), stage III (HCC)   . Dehydration   . BPH (benign prostatic hyperplasia)   . Acute respiratory failure (HCC)   . Altered mental status 02/05/2019  . GIB (gastrointestinal bleeding) 10/09/2017     Past Surgical History   Past Surgical History:  Procedure Laterality Date  . ABLATION    . APPENDECTOMY    . COLONOSCOPY N/A 10/10/2017   Procedure: COLONOSCOPY;  Surgeon: Wyline Mood, MD;  Location: [facility] ENDOSCOPY;  Service: Gastroenterology;  Laterality: N/A;  . ESOPHAGOGASTRODUODENOSCOPY N/A 10/10/2017   Procedure: ESOPHAGOGASTRODUODENOSCOPY (EGD);  Surgeon: Wyline Mood, MD;  Location: [facility] ENDOSCOPY;  Service: Gastroenterology;  Laterality: N/A;  . FISSURECTOMY       Home Medications   Prior to Admission medications   Medication Sig Start Date End Date Taking? Authorizing Provider  acetaminophen (TYLENOL) 500 MG tablet Take 500 mg by mouth 3 (three) times daily.    [provider]  albuterol (VENTOLIN HFA) 108 (90 Base) MCG/ACT inhaler Inhale 2 puffs into the lungs every 6 (six) hours. 04/11/20   Lolita Patella B, MD  amLODipine (NORVASC) 5 MG tablet Take 5 mg by mouth daily. 07/26/19   [provider]  aspirin EC 81 MG tablet Take 81 mg  by mouth daily.    [provider]  divalproex (DEPAKOTE ER) 250 MG 24 hr tablet Take 250 mg by mouth daily. 07/23/20   [provider]  famotidine (PEPCID) 40 MG tablet Take 40 mg by mouth daily.    [provider]  galantamine (RAZADYNE) 8 MG tablet Take 8 mg by mouth daily.  09/08/17   [provider]  LORazepam (ATIVAN) 0.5 MG tablet Take 1 tablet by mouth every 4 (four) hours as needed. 10/17/20   [provider]  Melatonin 5 MG TABS Take 1 tablet by mouth at bedtime.    [provider]  Nutritional Supplements (BOOST PO) Take by mouth.    [provider]  pantoprazole (PROTONIX) 40 MG tablet Take 40 mg by mouth daily. 08/25/17   [provider]  predniSONE (DELTASONE) 10 MG tablet Take 4 tablets (40 mg total) by mouth daily. 01/08/21   Sharyn Creamer, MD  senna (SENOKOT) 8.6 MG TABS tablet Take 1 tablet by mouth daily.    [provider]  simvastatin (ZOCOR) 20 MG tablet Take 20 mg by mouth at bedtime.  09/14/17   [provider]  tamsulosin (FLOMAX) 0.4 MG CAPS capsule Take 0.4 mg by mouth daily. 09/12/17   [provider]  traZODone (DESYREL) 50 MG tablet Take 50 mg by mouth at bedtime.    [provider]  valACYclovir (VALTREX) 1000 MG tablet Take 1 tablet (1,000 mg total) by mouth 3 (three) times daily. 01/07/21   Delman Kitten, MD  VITAMIN D, ERGOCALCIFEROL, PO Take 900 Units by mouth daily.    [provider]     Allergies  Patient has no known allergies.   Family History   Family History  Family history unknown: Yes     Physical Exam  Triage Vital Signs: ED Triage Vitals  Enc Vitals Group     BP 05/04/22 0133 (!) 169/79     Pulse Rate 05/04/22 0133 82     Resp 05/04/22 0130 18     Temp 05/04/22 0133 98.3 F (36.8 C)     Temp Source 05/04/22 0130 Oral     SpO2 05/04/22 0133 95 %     Weight 05/04/22 0130 220 lb (99.8 kg)     Height 05/04/22 0130 5\' 8"  (1.727 m)      Head Circumference --      Peak Flow --      Pain Score --      Pain Loc --      Pain Edu? --      Excl. in [place]? --     Updated Vital Signs: BP (!) 169/79   Pulse 82   Temp 98.3 F (36.8 C) (Oral)   Resp 18   Ht 5\' 8"  (1.727 m)   Wt 99.8 kg   SpO2 95%   BMI 33.45 kg/m    General: Awake, no distress.  CV:  RRR.  Good peripheral perfusion. Resp:  Normal effort.  CTAB. Abd:  Nontender.  No distention.  Other:  Small hematoma noted to left forehead without active bleeding.  No lacerations.  PERRL.  EOMI.  Chronic left subconjunctival redness.  Nose is atraumatic.  No dental malocclusion.  No cervical spine tenderness to palpation.  No step-offs or deformities noted.   ED Results / Procedures / Treatments  Labs (all labs ordered are listed, but only abnormal results are displayed) Labs Reviewed - No data to display   EKG  ED ECG REPORT I, Jaeceon Michelin J, the attending physician, personally viewed and interpreted this ECG.   Date: 05/04/2022  EKG Time: 0133  Rate: 82  Rhythm: normal sinus rhythm  Axis: Normal  Intervals:none  ST&T Change: Nonspecific    RADIOLOGY I have independently visualized and interpreted patient's CT scans as well as noted the radiology interpretation:  CT head: No ICH  CT cervical spine: No acute osseous injury  Official radiology report(s): CT Head Wo Contrast  Result Date: 05/04/2022 CLINICAL DATA:  Trauma. EXAM: CT HEAD WITHOUT CONTRAST CT CERVICAL SPINE WITHOUT CONTRAST TECHNIQUE: Multidetector CT imaging of the head and cervical spine was performed following the standard protocol without intravenous contrast. Multiplanar CT image reconstructions of the cervical spine were also generated. RADIATION DOSE REDUCTION: This exam was performed according to the departmental dose-optimization program which includes automated exposure control, adjustment of the mA and/or kV according to patient size and/or use of iterative reconstruction technique.  COMPARISON:  Head CT dated 01/07/2021. FINDINGS: CT HEAD FINDINGS Brain: Moderate age-related atrophy and chronic microvascular ischemic changes. There is no acute intracranial hemorrhage. No mass effect or midline shift. No extra-axial fluid collection. Vascular: No hyperdense vessel or unexpected calcification. Skull: Normal. Negative for fracture or focal lesion. Sinuses/Orbits: Mild mucoperiosteal thickening of paranasal sinuses. No air-fluid. The mastoid air cells are clear. Other: Small contusion over the left forehead. CT CERVICAL SPINE FINDINGS Alignment: No  acute subluxation. Skull base and vertebrae: No acute fracture.  Osteopenia. Soft tissues and spinal canal: No prevertebral fluid or swelling. No visible canal hematoma. Disc levels:  No acute findings.  Degenerative changes. Upper chest: Biapical subpleural scarring. Other: Bilateral carotid bulb calcified plaques. IMPRESSION: 1. No acute intracranial pathology. Moderate age-related atrophy and chronic microvascular ischemic changes. 2. No acute/traumatic cervical spine pathology. Electronically Signed   By: Anner Crete M.D.   On: 05/04/2022 02:19   CT Cervical Spine Wo Contrast  Result Date: 05/04/2022 CLINICAL DATA:  Trauma. EXAM: CT HEAD WITHOUT CONTRAST CT CERVICAL SPINE WITHOUT CONTRAST TECHNIQUE: Multidetector CT imaging of the head and cervical spine was performed following the standard protocol without intravenous contrast. Multiplanar CT image reconstructions of the cervical spine were also generated. RADIATION DOSE REDUCTION: This exam was performed according to the departmental dose-optimization program which includes automated exposure control, adjustment of the mA and/or kV according to patient size and/or use of iterative reconstruction technique. COMPARISON:  Head CT dated 01/07/2021. FINDINGS: CT HEAD FINDINGS Brain: Moderate age-related atrophy and chronic microvascular ischemic changes. There is no acute intracranial  hemorrhage. No mass effect or midline shift. No extra-axial fluid collection. Vascular: No hyperdense vessel or unexpected calcification. Skull: Normal. Negative for fracture or focal lesion. Sinuses/Orbits: Mild mucoperiosteal thickening of paranasal sinuses. No air-fluid. The mastoid air cells are clear. Other: Small contusion over the left forehead. CT CERVICAL SPINE FINDINGS Alignment: No acute subluxation. Skull base and vertebrae: No acute fracture.  Osteopenia. Soft tissues and spinal canal: No prevertebral fluid or swelling. No visible canal hematoma. Disc levels:  No acute findings.  Degenerative changes. Upper chest: Biapical subpleural scarring. Other: Bilateral carotid bulb calcified plaques. IMPRESSION: 1. No acute intracranial pathology. Moderate age-related atrophy and chronic microvascular ischemic changes. 2. No acute/traumatic cervical spine pathology. Electronically Signed   By: Anner Crete M.D.   On: 05/04/2022 02:19     PROCEDURES:  Critical Care performed: No  Procedures   MEDICATIONS ORDERED IN ED: Medications - No data to display   IMPRESSION / MDM / [place] / ED COURSE  I reviewed the triage vital signs and the nursing notes.                             88 year old male presenting with unwitnessed fall with left forehead hematoma.  Differential diagnosis includes but is not limited to [place], SDH, cervical spine injury, etc.  I have personally reviewed patient's records and note an orthopedic office visit on 03/11/2022 for right knee osteoarthritis.  Patient's presentation is most consistent with acute presentation with potential threat to life or bodily function.  Will obtain CT head and cervical spine, EKG and reassess.  Clinical Course as of 05/04/22 0314  Tue May 04, 2022  0229 CT head and cervical spine unremarkable.  EKG negative.  Patient is safe for discharge back to nursing facility.  Strict return precautions given.  Patient verbalizes  understanding and agrees with plan of care. [JS]    Clinical Course User Index [JS] Paulette Blanch, MD     FINAL CLINICAL IMPRESSION(S) / ED DIAGNOSES   Final diagnoses:  Fall, initial encounter  Injury of head, initial encounter  Contusion of face, initial encounter     Rx / DC Orders   ED Discharge Orders     None        Note:  This document was prepared using Dragon voice recognition  software and may include unintentional dictation errors.   Paulette Blanch, MD 05/04/22 [phone]

## 2022-05-04 NOTE — ED Triage Notes (Signed)
Pt presents with ems with fall. Pt with laceration noted to left forehead with controlled bleeding. Pt was found on floor per ems. Pt does now know what happened and per ems has memory impairment. Pt with left lower eyelid redness and swelling noted. Denies pain.

## 2022-05-04 NOTE — Discharge Instructions (Addendum)
Take all medications as directed by your doctor.  Return to the ER for worsening symptoms, persistent vomiting, difficulty breathing, lethargy or other concerns.

## 2022-12-24 IMAGING — CT CT HEAD CODE STROKE
3 series · 15 of 47 positions shown, 18 images · non-contrast
Comparison: CT head 02/04/2019.

CLINICAL DATA: Code stroke. Neuro deficit, acute, stroke suspected
left facial droop

EXAM:
CT HEAD WITHOUT CONTRAST
TECHNIQUE: Contiguous axial images were obtained from the base of the skull
through the vertex without intravenous contrast.

[Series 3: head wo · axial · 0.42mm/px · z∈[-165,-40]mm · 9 of 31 slices shown, 12 images]
[im 3/31  brain]
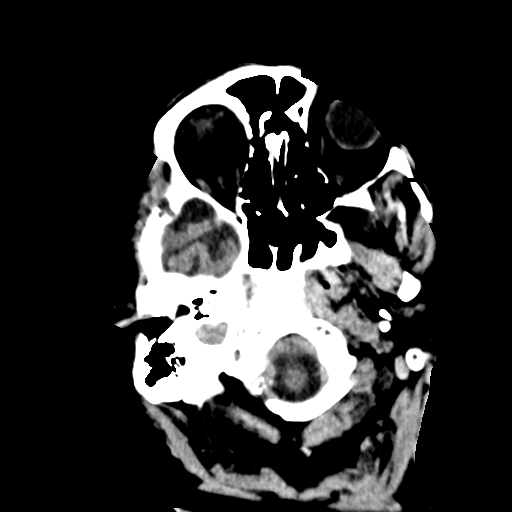
[im 3/31  bone]
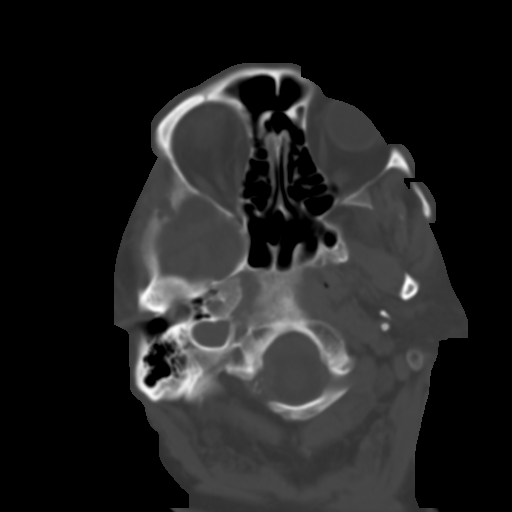
[im 6/31  brain]
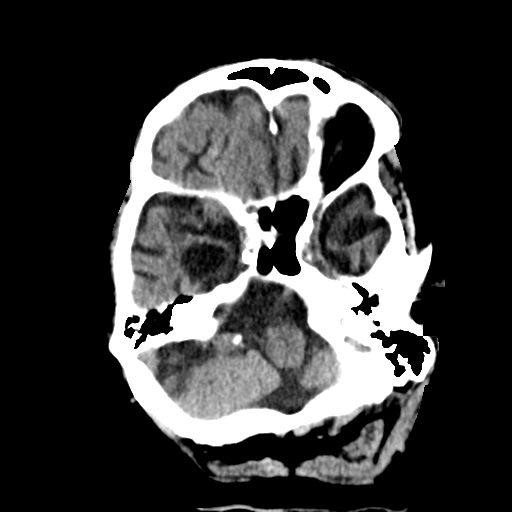
[im 9/31  brain]
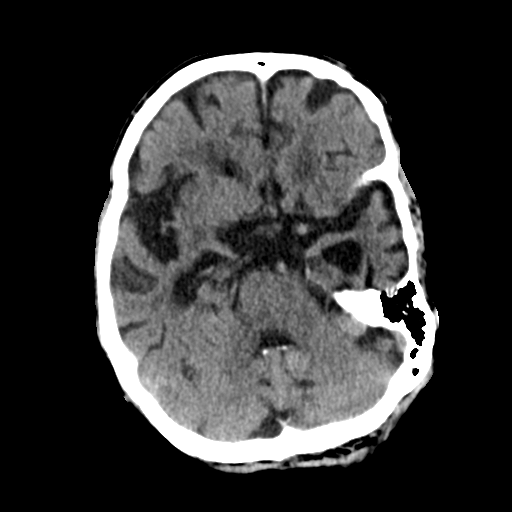
[im 12/31  brain]
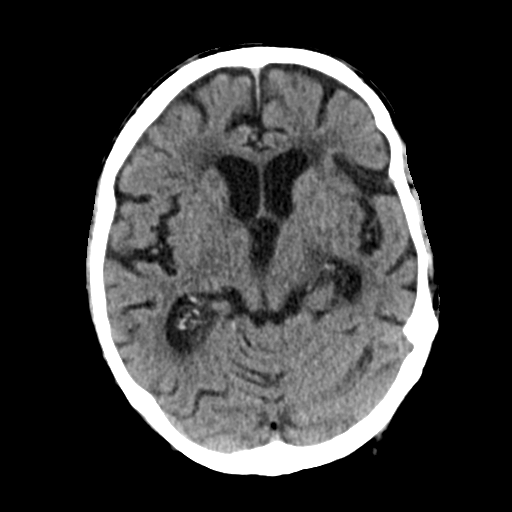
[im 16/31  brain]
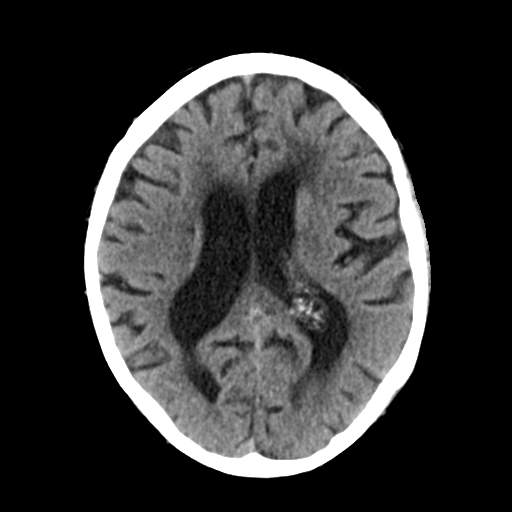
[im 16/31  bone]
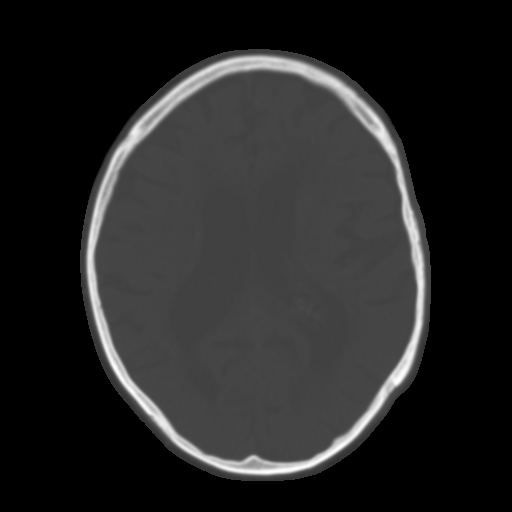
[im 19/31  brain]
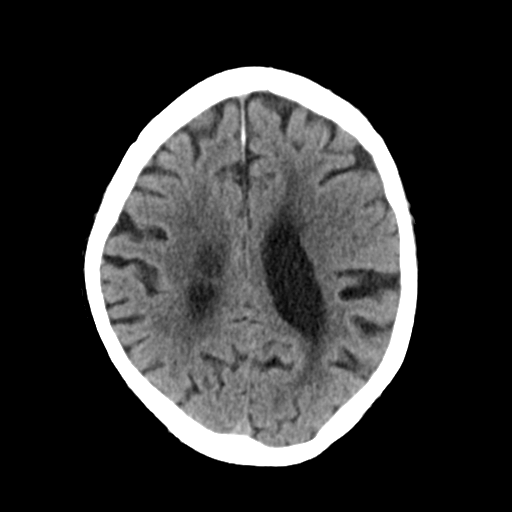
[im 22/31  brain]
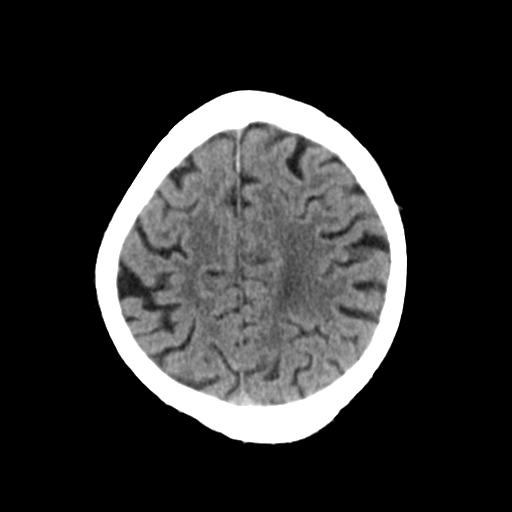
[im 25/31  brain]
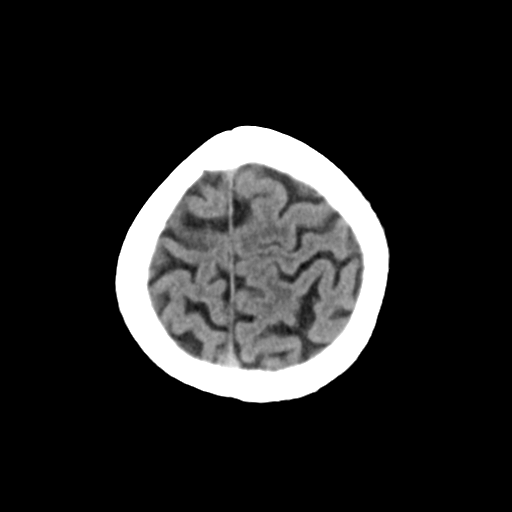
[im 28/31  brain]
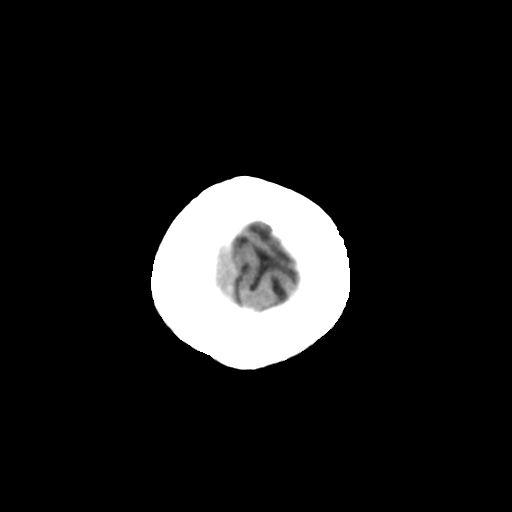
[im 28/31  bone]
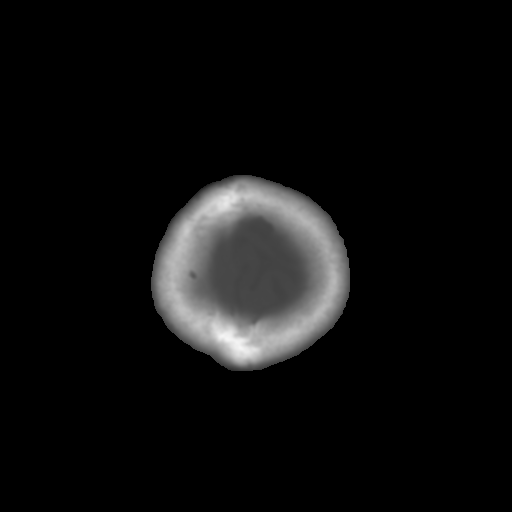

[Series 5: coronal soft tissue · coronal · 0.35mm/px · 3 of 68 slices shown]
[im 24/68  brain]
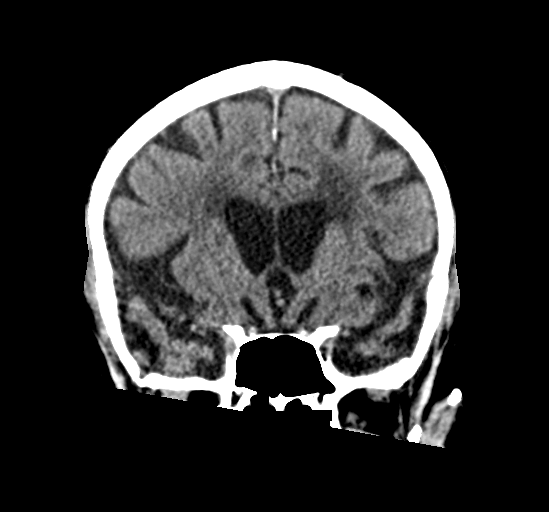
[im 31/68  brain]
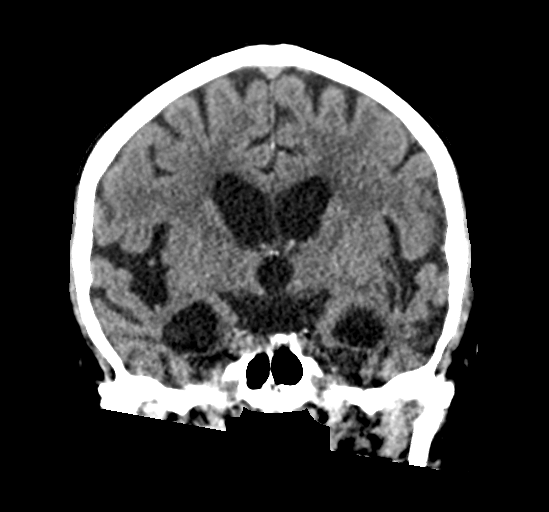
[im 38/68  brain]
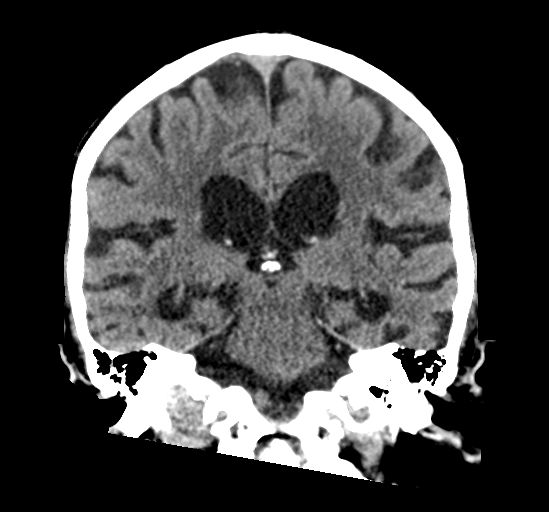

[Series 6: sagittal soft tissue · sagittal · 0.35mm/px · 3 of 53 slices shown]
[im 21/53  brain]
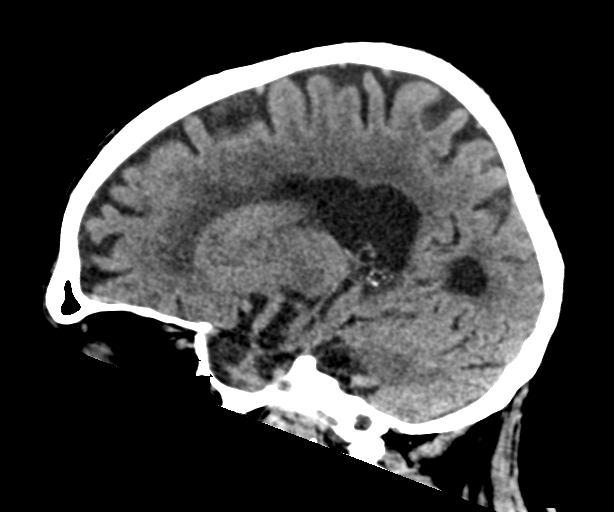
[im 27/53  brain]
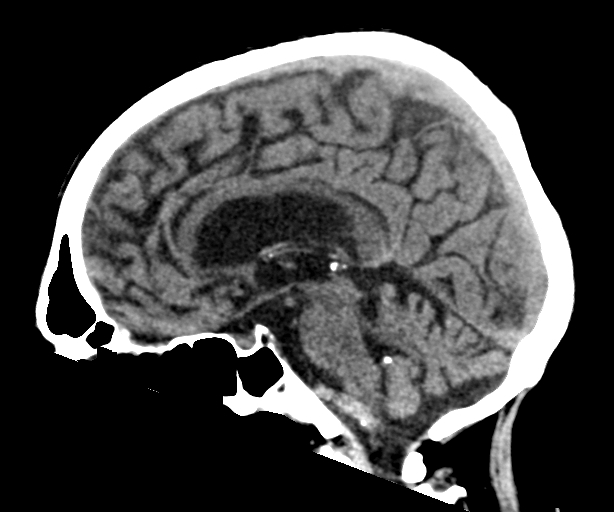
[im 33/53  brain]
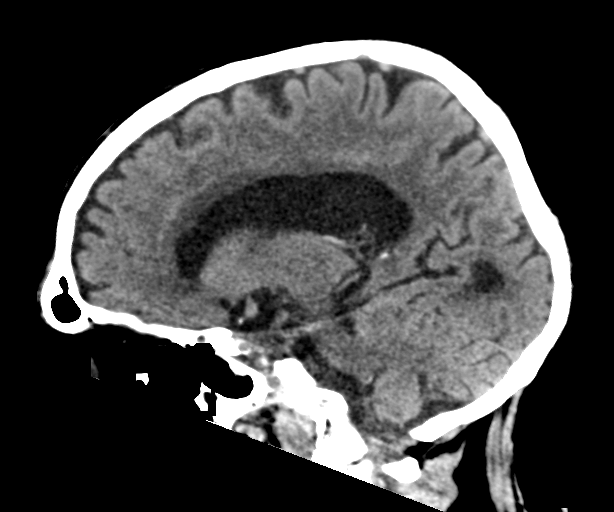

[15 of 47 positions shown; findings below may reference images not displayed]

FINDINGS: Brain: No evidence of acute infarction, hemorrhage, hydrocephalus,
extra-axial collection or mass lesion/mass effect. Severe temporal
lobe atrophy bilaterally with ex vacuo temporal horn dilation.
Otherwise, atrophy is moderate. Scattered white matter
hypodensities, nonspecific but compatible with chronic microvascular
ischemic disease.

Vascular: No hyperdense vessel identified. Calcific intracranial
atherosclerosis.

Skull: No acute fracture.

Sinuses/Orbits: Clear sinuses. No acute findings in the visualized
orbits.

Other: No mastoid effusions.

ASPECTS ([place] Stroke Program Early CT Score) total score (0-10
with 10 being normal): 10.
IMPRESSION: 1. No evidence of acute large vascular territory infarct or acute
hemorrhage.ASPECTS is 10.
2. Severe temporal lobe atrophy bilaterally, nonspecific but can be
seen with Alzheimer's disease. Otherwise, atrophy is moderate.
3. Chronic microvascular ischemic disease.

Code stroke imaging results were communicated on 01/07/2021 at [DATE] to provider Dr. Panshak via secure text paging.

## 2023-08-17 ENCOUNTER — Other Ambulatory Visit: Payer: Self-pay

## 2023-08-17 ENCOUNTER — Emergency Department
Admission: EM | Admit: 2023-08-17 | Discharge: 2023-08-17 | Disposition: A | Discharge status: Skilled Nursing Facility | Attending: Emergency Medicine | Admitting: Emergency Medicine

## 2023-08-17 ENCOUNTER — Emergency Department

## 2023-08-17 DIAGNOSIS — S01311A Laceration without foreign body of right ear, initial encounter: Secondary | ICD-10-CM | POA: Diagnosis not present

## 2023-08-17 DIAGNOSIS — W19XXXA Unspecified fall, initial encounter: Secondary | ICD-10-CM | POA: Insufficient documentation

## 2023-08-17 DIAGNOSIS — I10 Essential (primary) hypertension: Secondary | ICD-10-CM | POA: Insufficient documentation

## 2023-08-17 DIAGNOSIS — F0390 Unspecified dementia without behavioral disturbance: Secondary | ICD-10-CM | POA: Insufficient documentation

## 2023-08-17 DIAGNOSIS — S0990XA Unspecified injury of head, initial encounter: Secondary | ICD-10-CM

## 2023-08-17 LAB — URINALYSIS, ROUTINE W REFLEX MICROSCOPIC
Bacteria, UA: NONE SEEN
Bilirubin Urine: NEGATIVE
Glucose, UA: NEGATIVE mg/dL
Hgb urine dipstick: NEGATIVE
Ketones, ur: NEGATIVE mg/dL
Leukocytes,Ua: NEGATIVE
Nitrite: NEGATIVE
Protein, ur: 30 mg/dL — AB
Specific Gravity, Urine: 1.009 (ref 1.005–1.030)
Squamous Epithelial / HPF: 0 /HPF (ref 0–5)
WBC, UA: 0 WBC/hpf (ref 0–5)
pH: 7 (ref 5.0–8.0)

## 2023-08-17 LAB — BASIC METABOLIC PANEL WITH GFR
Anion gap: 8 (ref 5–15)
BUN: 32 mg/dL — ABNORMAL HIGH (ref 8–23)
CO2: 25 mmol/L (ref 22–32)
Calcium: 9 mg/dL (ref 8.9–10.3)
Chloride: 104 mmol/L (ref 98–111)
Creatinine, Ser: 1.95 mg/dL — ABNORMAL HIGH (ref 0.61–1.24)
GFR, Estimated: 32 mL/min — ABNORMAL LOW (ref >60)
Glucose, Bld: 93 mg/dL (ref 70–99)
Potassium: 4.1 mmol/L (ref 3.5–5.1)
Sodium: 137 mmol/L (ref 135–145)

## 2023-08-17 LAB — CBC WITH DIFFERENTIAL/PLATELET
Abs Immature Granulocytes: 0.07 10*3/uL (ref 0.00–0.07)
Basophils Absolute: 0.1 10*3/uL (ref 0.0–0.1)
Basophils Relative: 1 %
Eosinophils Absolute: 0.2 10*3/uL (ref 0.0–0.5)
Eosinophils Relative: 2 %
HCT: 38.6 % — ABNORMAL LOW (ref 39.0–52.0)
Hemoglobin: 12.7 g/dL — ABNORMAL LOW (ref 13.0–17.0)
Immature Granulocytes: 1 %
Lymphocytes Relative: 14 %
Lymphs Abs: 1.2 10*3/uL (ref 0.7–4.0)
MCH: 28.1 pg (ref 26.0–34.0)
MCHC: 32.9 g/dL (ref 30.0–36.0)
MCV: 85.4 fL (ref 80.0–100.0)
Monocytes Absolute: 0.9 10*3/uL (ref 0.1–1.0)
Monocytes Relative: 11 %
Neutro Abs: 6.1 10*3/uL (ref 1.7–7.7)
Neutrophils Relative %: 71 %
Platelets: 286 10*3/uL (ref 150–400)
RBC: 4.52 MIL/uL (ref 4.22–5.81)
RDW: 14.7 % (ref 11.5–15.5)
WBC: 8.6 10*3/uL (ref 4.0–10.5)
nRBC: 0 % (ref 0.0–0.2)

## 2023-08-17 LAB — CK: Total CK: 154 U/L (ref 49–397)

## 2023-08-17 MED ORDER — ACETAMINOPHEN 325 MG PO TABS
650.0000 mg | ORAL_TABLET | Freq: Once | ORAL | Status: AC
  Administered 2023-08-17: 650 mg via ORAL
  Filled 2023-08-17: qty 2

## 2023-08-17 MED ORDER — LIDOCAINE HCL (PF) 1 % IJ SOLN
5.0000 mL | Freq: Once | INTRAMUSCULAR | Status: AC
  Administered 2023-08-17: 5 mL via INTRADERMAL
  Filled 2023-08-17: qty 5

## 2023-08-17 NOTE — ED Provider Notes (Signed)
 [facility] Provider Note    Event Date/Time   First MD Initiated Contact with Patient 08/17/23 [phone]     (approximate)   History   Fall   HPI  Paul Bryan is a 89 y.o. male history of hypertension, dementia presents emergency department after a fall.  Patient stays at [facility] memory care.  They found him on the floor this morning.  Unsure of how long he had been down.  Daughter states that he has bleeding at the right ear.  Has not complained of anything else.  States he usually can walk with a walker.  However lately [facility] has had him in a wheelchair more often.  States he feels like he has declined in the last 2 weeks.  No LOC.  No vomiting.  HPI/review of systems difficult as patient is very demented all information gathered from the family      Physical Exam   Triage Vital Signs: ED Triage Vitals  Encounter Vitals Group     BP 08/17/23 0743 (!) 166/69     Systolic BP Percentile --      Diastolic BP Percentile --      Pulse Rate 08/17/23 0743 63     Resp 08/17/23 0743 18     Temp 08/17/23 0743 98.2 F (36.8 C)     Temp Source 08/17/23 0743 Oral     SpO2 08/17/23 0743 99 %     Weight 08/17/23 0749 171 lb 14.4 oz (78 kg)     Height 08/17/23 0749 5\' 8"  (1.727 m)     Head Circumference --      Peak Flow --      Pain Score --      Pain Loc --      Pain Education --      Exclude from Growth Chart --     Most recent vital signs: Vitals:   08/17/23 0743 08/17/23 0748  BP: (!) 166/69 (!) 172/86  Pulse: 63 75  Resp: 18   Temp: 98.2 F (36.8 C)   SpO2: 99% 95%     General: Awake, no distress.   CV:  Good peripheral perfusion. regular rate and  rhythm Resp:  Normal effort.  Chest nontender Abd:  No distention.  Nontender, no bruising Other:  skull nontender, here of the right ear has large amount of bleeding and caked blood, TM appears to be intact, however is obscured by a lot of clotted blood that his come from outside the  ear.  No extremity tenderness, spine nontender, legs equal length, no rotation, neurovascular intact, baseline dementia   ED Results / Procedures / Treatments   Labs (all labs ordered are listed, but only abnormal results are displayed) Labs Reviewed  CBC WITH DIFFERENTIAL/PLATELET - Abnormal; Notable for the following components:      Result Value   Hemoglobin 12.7 (*)    HCT 38.6 (*)    All other components within normal limits  BASIC METABOLIC PANEL WITH GFR - Abnormal; Notable for the following components:   BUN 32 (*)    Creatinine, Ser 1.95 (*)    GFR, Estimated 32 (*)    All other components within normal limits  URINALYSIS, ROUTINE W REFLEX MICROSCOPIC - Abnormal; Notable for the following components:   Color, Urine STRAW (*)    APPearance CLEAR (*)    Protein, ur 30 (*)    All other components within normal limits  CK  EKG     RADIOLOGY CT of the head cervical spine    PROCEDURES:   .Laceration Repair  Date/Time: 08/17/2023 11:17 AM  Performed by: Faythe Ghee, PA-C Authorized by: Faythe Ghee, PA-C   Consent:    Consent obtained:  Verbal   Consent given by:  Patient   Risks, benefits, and alternatives were discussed: yes     Risks discussed:  Infection, pain, retained foreign body, tendon damage, poor cosmetic result, need for additional repair, nerve damage, poor wound healing and vascular damage   Alternatives discussed:  Referral Universal protocol:    Procedure explained and questions answered to patient or proxy's satisfaction: yes     Immediately prior to procedure, a time out was called: yes     Patient identity confirmed:  Verbally with patient and arm band Anesthesia:    Anesthesia method:  Local infiltration   Local anesthetic:  Lidocaine 1% w/o epi Laceration details:    Location:  Ear   Ear location:  R ear   Length (cm):  4 Pre-procedure details:    Preparation:  Patient was prepped and draped in usual sterile fashion and  imaging obtained to evaluate for foreign bodies Exploration:    Limited defect created (wound extended): no     Hemostasis achieved with:  Direct pressure   Imaging outcome: foreign body not noted     Wound exploration: wound explored through full range of motion and entire depth of wound visualized     Wound extent: areolar tissue not violated, fascia not violated, no foreign body, no signs of injury, no nerve damage, no tendon damage, no underlying fracture and no vascular damage     Contaminated: no   Treatment:    Area cleansed with:  Povidone-iodine and saline   Amount of cleaning:  Standard   Irrigation solution:  Sterile saline   Irrigation method:  Tap and syringe   Debridement:  None   Undermining:  None   Scar revision: no   Skin repair:    Repair method:  Sutures   Suture size:  4-0   Suture material:  Fast-absorbing gut   Suture technique:  Simple interrupted   Number of sutures:  8 Approximation:    Approximation:  Close Repair type:    Repair type:  Simple Post-procedure details:    Dressing:  Non-adherent dressing   Procedure completion:  Tolerated well, no immediate complications  Chief Complaint  Patient presents with   Fall      MEDICATIONS ORDERED IN ED: Medications  acetaminophen (TYLENOL) tablet 650 mg (650 mg Oral Given 08/17/23 0921)  lidocaine (PF) (XYLOCAINE) 1 % injection 5 mL (5 mLs Intradermal Given by Other 08/17/23 0950)     IMPRESSION / MDM / ASSESSMENT AND PLAN / ED COURSE  I reviewed the triage vital signs and the nursing notes.                              Differential diagnosis includes, but is not limited to, subdural, SAH, fracture, contusion, strain, laceration  Patient's presentation is most consistent with acute illness / injury with system symptoms.   CT of the head and cervical spine, labs, will try to get UA for the patient's recent decline in the last 2 weeks to assess for UTI   Labs reassuring CT of the head cervical  spine independently reviewed interpreted by me as being negative for any acute abnormality  Patient tolerated procedure well.  Suture care instructions provided to the granddaughter.  Patient appears to be stable.  Bleeding was controlled after sutures.  Family is in agreement treatment plan.  He was discharged stable condition and return to [facility] via their private vehicle.   FINAL CLINICAL IMPRESSION(S) / ED DIAGNOSES   Final diagnoses:  Fall, initial encounter  Minor head injury, initial encounter  Complex laceration of right ear, initial encounter     Rx / DC Orders   ED Discharge Orders     None        Note:  This document was prepared using Dragon voice recognition software and may include unintentional dictation errors.    Faythe Ghee, PA-C 08/17/23 1352    Sharyn Creamer, MD 08/17/23 [phone]

## 2023-08-17 NOTE — ED Triage Notes (Signed)
 S: - Pt arrives via [facility] from [facility] B: - Medical Hx: Dementia  A: - EMS Assessment: Bilateral leg and back pain  - EMS Vitals: BP: 172/86, HR: 75, SPO2 95% on RA, no CBG. - EMS interventions: None  Is Pt on O2? Is this their baseline? No Oxgen requirement  - This RNs Assessment: Airway - Intact Breathing -  RA Circulation -  Disability - Oriented to self only  R: - What brought Pt into ER today? Pt was checked on by staff at 6am and noticed he fell on the floor, fall aws unwitnessed. Pt has laceration to right ear and skin tear on his head. Normal to his baseline orientation, not on blood thinners, per facility.

## 2023-08-17 NOTE — ED Notes (Signed)
 Fall precautions in place for Pt. This RN placed fall band, fall grip socks, bed alarm and fall sign.

## 2023-08-17 NOTE — Discharge Instructions (Signed)
 Clean the area daily with soap and water. Apply a dressing if the areas is oozing Once the area has started to heal you can leave it open to the air.  Sutures are absorbable so they should go away on their own If any sign of infection please see your doctor or return emergency department

## 2023-09-20 ENCOUNTER — Emergency Department

## 2023-09-20 ENCOUNTER — Other Ambulatory Visit: Payer: Self-pay

## 2023-09-20 ENCOUNTER — Encounter: Payer: Self-pay | Admitting: Emergency Medicine

## 2023-09-20 ENCOUNTER — Observation Stay
Admission: EM | Admit: 2023-09-20 | Discharge: 2023-09-22 | Disposition: A | Discharge status: Other Acute Inpatient Hospital | Attending: Internal Medicine | Admitting: Internal Medicine

## 2023-09-20 ENCOUNTER — Telehealth: Payer: Self-pay | Admitting: Neurosurgery

## 2023-09-20 DIAGNOSIS — S065XAA Traumatic subdural hemorrhage with loss of consciousness status unknown, initial encounter: Secondary | ICD-10-CM | POA: Diagnosis not present

## 2023-09-20 DIAGNOSIS — I10 Essential (primary) hypertension: Secondary | ICD-10-CM | POA: Diagnosis present

## 2023-09-20 DIAGNOSIS — N179 Acute kidney failure, unspecified: Secondary | ICD-10-CM | POA: Insufficient documentation

## 2023-09-20 DIAGNOSIS — R2689 Other abnormalities of gait and mobility: Secondary | ICD-10-CM | POA: Insufficient documentation

## 2023-09-20 DIAGNOSIS — Z79899 Other long term (current) drug therapy: Secondary | ICD-10-CM | POA: Insufficient documentation

## 2023-09-20 DIAGNOSIS — Z7982 Long term (current) use of aspirin: Secondary | ICD-10-CM | POA: Diagnosis not present

## 2023-09-20 DIAGNOSIS — N400 Enlarged prostate without lower urinary tract symptoms: Secondary | ICD-10-CM | POA: Diagnosis present

## 2023-09-20 DIAGNOSIS — S065X0A Traumatic subdural hemorrhage without loss of consciousness, initial encounter: Principal | ICD-10-CM | POA: Insufficient documentation

## 2023-09-20 DIAGNOSIS — K5730 Diverticulosis of large intestine without perforation or abscess without bleeding: Secondary | ICD-10-CM | POA: Insufficient documentation

## 2023-09-20 DIAGNOSIS — W19XXXA Unspecified fall, initial encounter: Secondary | ICD-10-CM | POA: Insufficient documentation

## 2023-09-20 DIAGNOSIS — Z87891 Personal history of nicotine dependence: Secondary | ICD-10-CM | POA: Insufficient documentation

## 2023-09-20 DIAGNOSIS — R079 Chest pain, unspecified: Secondary | ICD-10-CM | POA: Insufficient documentation

## 2023-09-20 DIAGNOSIS — R531 Weakness: Secondary | ICD-10-CM | POA: Diagnosis present

## 2023-09-20 DIAGNOSIS — N184 Chronic kidney disease, stage 4 (severe): Secondary | ICD-10-CM

## 2023-09-20 DIAGNOSIS — Z1152 Encounter for screening for COVID-19: Secondary | ICD-10-CM | POA: Insufficient documentation

## 2023-09-20 DIAGNOSIS — R6889 Other general symptoms and signs: Principal | ICD-10-CM

## 2023-09-20 DIAGNOSIS — R6510 Systemic inflammatory response syndrome (SIRS) of non-infectious origin without acute organ dysfunction: Secondary | ICD-10-CM | POA: Diagnosis not present

## 2023-09-20 DIAGNOSIS — I129 Hypertensive chronic kidney disease with stage 1 through stage 4 chronic kidney disease, or unspecified chronic kidney disease: Secondary | ICD-10-CM | POA: Diagnosis not present

## 2023-09-20 DIAGNOSIS — F0390 Unspecified dementia without behavioral disturbance: Secondary | ICD-10-CM | POA: Insufficient documentation

## 2023-09-20 DIAGNOSIS — D72829 Elevated white blood cell count, unspecified: Secondary | ICD-10-CM | POA: Insufficient documentation

## 2023-09-20 DIAGNOSIS — N1832 Chronic kidney disease, stage 3b: Secondary | ICD-10-CM | POA: Diagnosis not present

## 2023-09-20 DIAGNOSIS — A419 Sepsis, unspecified organism: Secondary | ICD-10-CM | POA: Insufficient documentation

## 2023-09-20 LAB — URINALYSIS, ROUTINE W REFLEX MICROSCOPIC
Bacteria, UA: NONE SEEN
Bilirubin Urine: NEGATIVE
Glucose, UA: NEGATIVE mg/dL
Ketones, ur: NEGATIVE mg/dL
Leukocytes,Ua: NEGATIVE
Nitrite: NEGATIVE
Protein, ur: 100 mg/dL — AB
Specific Gravity, Urine: 1.015 (ref 1.005–1.030)
Squamous Epithelial / HPF: 0 /HPF (ref 0–5)
pH: 5 (ref 5.0–8.0)

## 2023-09-20 LAB — COMPREHENSIVE METABOLIC PANEL WITH GFR
ALT: 14 U/L (ref 0–44)
AST: 30 U/L (ref 15–41)
Albumin: 4 g/dL (ref 3.5–5.0)
Alkaline Phosphatase: 46 U/L (ref 38–126)
Anion gap: 12 (ref 5–15)
BUN: 37 mg/dL — ABNORMAL HIGH (ref 8–23)
CO2: 20 mmol/L — ABNORMAL LOW (ref 22–32)
Calcium: 9.6 mg/dL (ref 8.9–10.3)
Chloride: 108 mmol/L (ref 98–111)
Creatinine, Ser: 2.42 mg/dL — ABNORMAL HIGH (ref 0.61–1.24)
GFR, Estimated: 24 mL/min — ABNORMAL LOW (ref >60)
Glucose, Bld: 111 mg/dL — ABNORMAL HIGH (ref 70–99)
Potassium: 3.9 mmol/L (ref 3.5–5.1)
Sodium: 140 mmol/L (ref 135–145)
Total Bilirubin: 0.6 mg/dL (ref 0.0–1.2)
Total Protein: 7.5 g/dL (ref 6.5–8.1)

## 2023-09-20 LAB — CBC
HCT: 38.2 % — ABNORMAL LOW (ref 39.0–52.0)
Hemoglobin: 12.3 g/dL — ABNORMAL LOW (ref 13.0–17.0)
MCH: 28.1 pg (ref 26.0–34.0)
MCHC: 32.2 g/dL (ref 30.0–36.0)
MCV: 87.2 fL (ref 80.0–100.0)
Platelets: 232 10*3/uL (ref 150–400)
RBC: 4.38 MIL/uL (ref 4.22–5.81)
RDW: 15.4 % (ref 11.5–15.5)
WBC: 15.1 10*3/uL — ABNORMAL HIGH (ref 4.0–10.5)
nRBC: 0 % (ref 0.0–0.2)

## 2023-09-20 LAB — LACTIC ACID, PLASMA
Lactic Acid, Venous: 4.5 mmol/L (ref 0.5–1.9)
Lactic Acid, Venous: 1.9 mmol/L (ref 0.5–1.9)

## 2023-09-20 LAB — RESP PANEL BY RT-PCR (RSV, FLU A&B, COVID)  RVPGX2
Influenza A by PCR: NEGATIVE
Influenza B by PCR: NEGATIVE
Resp Syncytial Virus by PCR: NEGATIVE
SARS Coronavirus 2 by RT PCR: NEGATIVE

## 2023-09-20 MED ORDER — SODIUM CHLORIDE 0.9 % IV SOLN
2.0000 g | Freq: Once | INTRAVENOUS | Status: AC
  Administered 2023-09-20: 2 g via INTRAVENOUS
  Filled 2023-09-20: qty 20

## 2023-09-20 MED ORDER — ARTIFICIAL TEARS OPHTHALMIC OINT
1.0000 | TOPICAL_OINTMENT | Freq: Every day | OPHTHALMIC | Status: DC
  Administered 2023-09-20: 1 via OPHTHALMIC
  Filled 2023-09-20: qty 1

## 2023-09-20 MED ORDER — POLYETHYLENE GLYCOL 3350 17 G PO PACK: 17.0000 g | PACK | Freq: Every day | ORAL | Status: DC | PRN

## 2023-09-20 MED ORDER — GALANTAMINE HYDROBROMIDE 4 MG PO TABS
8.0000 mg | ORAL_TABLET | Freq: Every day | ORAL | Status: DC
  Administered 2023-09-21: 8 mg via ORAL
  Filled 2023-09-20: qty 2

## 2023-09-20 MED ORDER — TAMSULOSIN HCL 0.4 MG PO CAPS
0.4000 mg | ORAL_CAPSULE | Freq: Every day | ORAL | Status: DC
  Administered 2023-09-21: 0.4 mg via ORAL
  Filled 2023-09-20: qty 1

## 2023-09-20 MED ORDER — DIVALPROEX SODIUM ER 250 MG PO TB24
250.0000 mg | ORAL_TABLET | Freq: Every day | ORAL | Status: DC
  Administered 2023-09-21: 250 mg via ORAL
  Filled 2023-09-20: qty 1

## 2023-09-20 MED ORDER — SODIUM CHLORIDE 0.9 % IV BOLUS
1000.0000 mL | Freq: Once | INTRAVENOUS | Status: AC
  Administered 2023-09-20: 1000 mL via INTRAVENOUS

## 2023-09-20 MED ORDER — DICLOFENAC SODIUM 1 % EX GEL
2.0000 g | Freq: Three times a day (TID) | CUTANEOUS | Status: DC
  Administered 2023-09-20 – 2023-09-21 (×2): 2 g via TOPICAL
  Filled 2023-09-20: qty 100

## 2023-09-20 MED ORDER — QUETIAPINE FUMARATE 25 MG PO TABS
25.0000 mg | ORAL_TABLET | Freq: Every evening | ORAL | Status: DC | PRN
  Administered 2023-09-20: 25 mg via ORAL
  Filled 2023-09-20: qty 1

## 2023-09-20 MED ORDER — AMLODIPINE BESYLATE 5 MG PO TABS
5.0000 mg | ORAL_TABLET | Freq: Every day | ORAL | Status: DC
Start: 2023-09-21 — End: 2023-09-22
  Administered 2023-09-21: 5 mg via ORAL
  Filled 2023-09-20: qty 1

## 2023-09-20 MED ORDER — ACETAMINOPHEN 500 MG PO TABS: 500.0000 mg | ORAL_TABLET | Freq: Three times a day (TID) | ORAL | Status: DC | PRN

## 2023-09-20 MED ORDER — FAMOTIDINE 20 MG PO TABS: 20.0000 mg | ORAL_TABLET | Freq: Every day | ORAL | Status: DC

## 2023-09-20 MED ORDER — LACTATED RINGERS IV BOLUS (SEPSIS)
1500.0000 mL | Freq: Once | INTRAVENOUS | Status: AC
  Administered 2023-09-20: 1500 mL via INTRAVENOUS

## 2023-09-20 MED ORDER — TRAZODONE HCL 50 MG PO TABS
50.0000 mg | ORAL_TABLET | Freq: Every day | ORAL | Status: DC
  Administered 2023-09-20: 50 mg via ORAL
  Filled 2023-09-20: qty 1

## 2023-09-20 NOTE — H&P (Signed)
 History and Physical    Patient: Paul Bryan UXL:[id number] DOB: Nov 19, 1929 DOA: 09/20/2023 DOS: the patient was seen and examined on 09/20/2023 PCP: Nestor Banter, MD  Patient coming from: Memory care unit  Chief Complaint:  Chief Complaint  Patient presents with   Fall   HPI: MURICE Bryan is a 89 y.o. male with medical history significant of dementia, HTN, rls, and frequent falls.  History is provided by the patient's daughter-in-law who is at bedside as patient does not recall much of the events surrounding his fall.   Patient has had multiple falls over the last year.  His most recent was about a month ago in which he sustained a laceration to the scalp and into his right ear.  CT head at that time was negative for brain bleed.  Per patient's daughter-in-law, the patient sleeps on the covers when trying to get in his bed.  They have made multiple changes to try and prevent future falls.  She believes patient may require 24 hours sitter given his frequent falls.   Patient denies any dysuria, dyspnea, chest pain, palpitations.   Review of Systems: Per above  Past Medical History:  Diagnosis Date   Hypertension    Restless leg syndrome    Past Surgical History:  Procedure Laterality Date   ABLATION     APPENDECTOMY     COLONOSCOPY N/A 10/10/2017   Procedure: COLONOSCOPY;  Surgeon: Luke Salaam, MD;  Location: [facility] ENDOSCOPY;  Service: Gastroenterology;  Laterality: N/A;   ESOPHAGOGASTRODUODENOSCOPY N/A 10/10/2017   Procedure: ESOPHAGOGASTRODUODENOSCOPY (EGD);  Surgeon: Luke Salaam, MD;  Location: [facility] ENDOSCOPY;  Service: Gastroenterology;  Laterality: N/A;   FISSURECTOMY     Social History:  reports that he has quit smoking. He has never used smokeless tobacco. He reports that he does not currently use alcohol. He reports that he does not use drugs.  No Known Allergies  Family History  Family history unknown: Yes    Prior to Admission medications   Medication Sig  Start Date End Date Taking? Authorizing Provider  acetaminophen  (TYLENOL ) 500 MG tablet Take 500 mg by mouth 3 (three) times daily.   Yes [provider]  amLODipine  (NORVASC ) 5 MG tablet Take 5 mg by mouth daily. 07/26/19  Yes [provider]  aspirin  EC 81 MG tablet Take 81 mg by mouth daily.   Yes [provider]  diclofenac Sodium (VOLTAREN) 1 % GEL Apply 2 g topically 3 (three) times daily. 08/23/23 08/22/24 Yes [provider]  divalproex (DEPAKOTE ER) 250 MG 24 hr tablet Take 250 mg by mouth daily. 07/23/20  Yes [provider]  famotidine  (PEPCID ) 40 MG tablet Take 40 mg by mouth daily.   Yes [provider]  galantamine  (RAZADYNE ) 8 MG tablet Take 8 mg by mouth daily.  09/08/17  Yes [provider]  Glycerin, PF, (OPTASE COMFORT DRY EYE) 1 % SOLN Apply 1 drop to eye at bedtime.   Yes [provider]  LORazepam  (ATIVAN ) 0.5 MG tablet Take 1 tablet by mouth every 4 (four) hours as needed. 10/17/20  Yes [provider]  Melatonin 5 MG TABS Take 1 tablet by mouth at bedtime.   Yes [provider]  Nutritional Supplements (BOOST PO) Take by mouth.   Yes [provider]  pantoprazole  (PROTONIX ) 40 MG tablet Take 40 mg by mouth daily. 08/25/17  Yes [provider]  senna (SENOKOT) 8.6 MG TABS tablet Take 1 tablet by mouth daily.  Yes [provider]  simvastatin  (ZOCOR ) 20 MG tablet Take 20 mg by mouth at bedtime.  09/14/17  Yes [provider]  tamsulosin  (FLOMAX ) 0.4 MG CAPS capsule Take 0.4 mg by mouth daily. 09/12/17  Yes [provider]  traZODone  (DESYREL ) 50 MG tablet Take 50 mg by mouth at bedtime.   Yes [provider]  Vitamin D , Ergocalciferol , 50000 units CAPS Take 1 capsule by mouth daily.   Yes [provider]  albuterol  (VENTOLIN  HFA) 108 (90 Base) MCG/ACT inhaler Inhale 2 puffs into the lungs every 6 (six) hours. Patient not taking:  Reported on 09/20/2023 04/11/20   Tiajuana Fluke, MD  predniSONE  (DELTASONE ) 10 MG tablet Take 4 tablets (40 mg total) by mouth daily. Patient not taking: Reported on 09/20/2023 01/08/21   Iver Marker, MD  valACYclovir  (VALTREX ) 1000 MG tablet Take 1 tablet (1,000 mg total) by mouth 3 (three) times daily. Patient not taking: Reported on 09/20/2023 01/07/21   Iver Marker, MD    Physical Exam: Vitals:   09/20/23 1430 09/20/23 1447 09/20/23 1622 09/20/23 2023  BP: 127/65  (!) 155/64 (!) 153/79  Pulse: 80 80 64 69  Resp: (!) 22 (!) 24 16 20   Temp:  98.6 F (37 C) 98 F (36.7 C) 98.4 F (36.9 C)  TempSrc:  Oral Oral Oral  SpO2: 98% 98% 96% 96%  Weight:      Height:       Physical Exam  Constitutional: In no distress. Pleaseant  Cardiovascular: Normal rate, regular rhythm. No lower extremity edema  Pulmonary: Non labored breathing on room air, no wheezing or rales.  No lower extremity edema Abdominal: Soft. Normal bowel sounds. Non distended and non tender Musculoskeletal: R knee swollen, no increased WTT, no erythema of overlying skin     Neurological: Non focal  Skin: Skin is warm and dry.   Data Reviewed:     Latest Ref Rng & Units 09/20/2023    7:59 AM 08/17/2023    8:03 AM 01/07/2021   12:33 PM  CBC  WBC 4.0 - 10.5 K/uL 15.1  8.6  7.2   Hemoglobin 13.0 - 17.0 g/dL 09.8  11.9  14.7   Hematocrit 39.0 - 52.0 % 38.2  38.6  37.4   Platelets 150 - 400 K/uL 232  286  269    CT Head Wo Contrast    Assessment and Plan:  SDH Appears subacute. Neurosurgery consulted.  Appreciate their input  Hold ASA appears to be for primary prevention   Frequent falls  Reportedly mechanical per daughter in law. Could be a component of orthostasis. Patient with AKI, hyaline casts on UA. And given his dementia he may not remember to drink and eat. Though his family says he has a great appetite.  Received IV fluids, could check orthostatic vitals in the AM  Dementia  Patient on trazodone ,  ativan  PRN, and galantamine .  Will hold ativan , discussed with family that prn antipsychotic will be used if he becomes overnight.   SIRS on presentation (Resolved)  Patient presented with tachycardia, low blood pressure, and leukocytosis.  As well as elevated lactate.  UTI and CT chest abdomen pelvis unrevealing for possible infectious source.  COVID and flu negative.  Blood cultures pending.  Status post one-time dose of ceftriaxone .  Plan: Hold further antibiotics for now  HTN:  Resume home anithypertensive  AKI on CKD 3b  NAGMA mild Scr 2.42<1.95 1 month ago  Continue to monitor s/p IV fluids  Leukocytosis  No clear source of infection Continue to monitor for now.    Advance Care Planning:   Code Status: Limited: Do not attempt resuscitation (DNR) -DNR-LIMITED -Do Not Intubate/DNI daughter-in-law discussed with patient's son.  Consults: Neurosurgery  Family Communication: Discussed with daughter-in-law at bedside  Severity of Illness: The appropriate patient status for this patient is OBSERVATION. Observation status is judged to be reasonable and necessary in order to provide the required intensity of service to ensure the patient's safety. The patient's presenting symptoms, physical exam findings, and initial radiographic and laboratory data in the context of their medical condition is felt to place them at decreased risk for further clinical deterioration. Furthermore, it is anticipated that the patient will be medically stable for discharge from the hospital within 2 midnights of admission.   Author: Joette Mustard, MD 09/20/2023 8:39 PM  For on call review [email or web address].

## 2023-09-20 NOTE — Consult Note (Signed)
 Consulting Department:  Emergency department  Primary Physician:  Nestor Banter, MD  Chief Complaint: Subdural hematoma  History of Present Illness: 09/20/2023 Paul Bryan is a 89 y.o. male who presents with the chief complaint of subdural hematoma.  He has a history of dementia, chronic kidney disease, hypertension had an unwitnessed fall at his memory center.  He denies hitting his head or having any pain.  Denies any nausea or vomiting.  Denies any headache.  Does have a history of dementia some of the history is taken from his daughter who is at his bedside right now and states that he is at his baseline.  Review of Systems:  A 10 point review of systems is negative, except for the pertinent positives and negatives detailed in the HPI.  Past Medical History: Past Medical History:  Diagnosis Date   Hypertension    Restless leg syndrome     Past Surgical History: Past Surgical History:  Procedure Laterality Date   ABLATION     APPENDECTOMY     COLONOSCOPY N/A 10/10/2017   Procedure: COLONOSCOPY;  Surgeon: Luke Salaam, MD;  Location: [facility] ENDOSCOPY;  Service: Gastroenterology;  Laterality: N/A;   ESOPHAGOGASTRODUODENOSCOPY N/A 10/10/2017   Procedure: ESOPHAGOGASTRODUODENOSCOPY (EGD);  Surgeon: Luke Salaam, MD;  Location: [facility] ENDOSCOPY;  Service: Gastroenterology;  Laterality: N/A;   FISSURECTOMY      Allergies: Allergies as of 09/20/2023   (No Known Allergies)    Medications:  Current Facility-Administered Medications:    polyethylene glycol (MIRALAX / GLYCOLAX) packet 17 g, 17 g, Oral, Daily PRN, Joette Mustard, MD  Current Outpatient Medications:    acetaminophen  (TYLENOL ) 500 MG tablet, Take 500 mg by mouth 3 (three) times daily., Disp: , Rfl:    amLODipine  (NORVASC ) 5 MG tablet, Take 5 mg by mouth daily., Disp: , Rfl:    aspirin  EC 81 MG tablet, Take 81 mg by mouth daily., Disp: , Rfl:    diclofenac Sodium (VOLTAREN) 1 % GEL, Apply 2 g topically 3 (three)  times daily., Disp: , Rfl:    divalproex (DEPAKOTE ER) 250 MG 24 hr tablet, Take 250 mg by mouth daily., Disp: , Rfl:    famotidine  (PEPCID ) 40 MG tablet, Take 40 mg by mouth daily., Disp: , Rfl:    galantamine  (RAZADYNE ) 8 MG tablet, Take 8 mg by mouth daily. , Disp: , Rfl: 1   Glycerin, PF, (OPTASE COMFORT DRY EYE) 1 % SOLN, Apply 1 drop to eye at bedtime., Disp: , Rfl:    LORazepam  (ATIVAN ) 0.5 MG tablet, Take 1 tablet by mouth every 4 (four) hours as needed., Disp: , Rfl:    Melatonin 5 MG TABS, Take 1 tablet by mouth at bedtime., Disp: , Rfl:    Nutritional Supplements (BOOST PO), Take by mouth., Disp: , Rfl:    pantoprazole  (PROTONIX ) 40 MG tablet, Take 40 mg by mouth daily., Disp: , Rfl: 3   senna (SENOKOT) 8.6 MG TABS tablet, Take 1 tablet by mouth daily., Disp: , Rfl:    simvastatin  (ZOCOR ) 20 MG tablet, Take 20 mg by mouth at bedtime. , Disp: , Rfl: 0   tamsulosin  (FLOMAX ) 0.4 MG CAPS capsule, Take 0.4 mg by mouth daily., Disp: , Rfl: 1   traZODone  (DESYREL ) 50 MG tablet, Take 50 mg by mouth at bedtime., Disp: , Rfl:    Vitamin D , Ergocalciferol , 50000 units CAPS, Take 1 capsule by mouth daily., Disp: , Rfl:    albuterol  (VENTOLIN  HFA) 108 (90 Base) MCG/ACT inhaler, Inhale 2  puffs into the lungs every 6 (six) hours. (Patient not taking: Reported on 09/20/2023), Disp: , Rfl:    predniSONE  (DELTASONE ) 10 MG tablet, Take 4 tablets (40 mg total) by mouth daily. (Patient not taking: Reported on 09/20/2023), Disp: 24 tablet, Rfl: 0   valACYclovir  (VALTREX ) 1000 MG tablet, Take 1 tablet (1,000 mg total) by mouth 3 (three) times daily. (Patient not taking: Reported on 09/20/2023), Disp: 21 tablet, Rfl: 0   Social History: Social History   Tobacco Use   Smoking status: Former   Smokeless tobacco: Never  Substance Use Topics   Alcohol use: Not Currently   Drug use: Never    Family Medical History: Family History  Family history unknown: Yes    Physical Examination: Vitals:    09/20/23 1430 09/20/23 1447  BP: 127/65   Pulse: 80 80  Resp: (!) 22 (!) 24  Temp:  98.6 F (37 C)  SpO2: 98% 98%     General: Patient is well developed, well nourished, calm, collected, and in no apparent distress.  NEUROLOGICAL:  General: In no acute distress.   Awake, alert, oriented to person, states that [facility] [place].  Pupils equal round and reactive to light.  He has mild left-sided facial asymmetry which is noted in his profile picture as well as daughter states that it is at his baseline.  Tongue protrusion is midline.  Bilateral upper extremities are full strength proximally and distally.  There is no pronator drift.    GCS:15   Bilateral upper and lower extremity sensation is intact to light touch.  Imaging: CT Head Wo Contrast Addendum Date: 09/20/2023 ADDENDUM REPORT: 09/20/2023 13:06 ADDENDUM: These results were called by telephone at the time of interpretation on 09/20/2023 at 10:27 AM to provider PHILLIP STAFFORD , who verbally acknowledged these results. Electronically Signed   By: Denny Flack M.D.   On: 09/20/2023 13:06   Result Date: 09/20/2023 CLINICAL DATA:  Mental status change, unwitnessed fall. EXAM: CT HEAD WITHOUT CONTRAST TECHNIQUE: Contiguous axial images were obtained from the base of the skull through the vertex without intravenous contrast. RADIATION DOSE REDUCTION: This exam was performed according to the departmental dose-optimization program which includes automated exposure control, adjustment of the mA and/or kV according to patient size and/or use of iterative reconstruction technique. COMPARISON:  CT head 08/17/2023. FINDINGS: Brain: Mixed attenuation subdural collection over the right parietal lobe measuring up to 7 mm in maximum thickness with minimal associated underlying mass effect. No midline shift. Subdural collection is primary hypoattenuating with a few possible areas of internal septation. No CT evidence of  completed large territory infarct. Moderate parenchymal volume loss with temporal lobe predominance. Nonspecific hypoattenuation in the periventricular and subcortical white matter favored to reflect chronic microvascular ischemic changes. Ventricles: The ventricles are normal. Vascular: Atherosclerotic calcifications of the carotid siphons and intracranial vertebral arteries. No hyperdense vessel. Skull: No acute or aggressive finding. Orbits: Orbits are symmetric. Sinuses: Mucosal thickening in the bilateral maxillary sinuses. Other: Mastoid air cells are clear. IMPRESSION: Mixed attenuation subdural collection over the right parietal lobe is new since 08/17/2023 suggestive of subacute subdural hematoma. Minimal associated mass effect. No midline shift. Chronic microvascular ischemic changes and parenchymal volume loss similar to prior. Electronically Signed: By: Denny Flack M.D. On: 09/20/2023 10:22   CT CHEST ABDOMEN PELVIS WO CONTRAST Result Date: 09/20/2023 CLINICAL DATA:  Sepsis.  Unwitnessed fall with leg weakness. EXAM: CT CHEST, ABDOMEN AND PELVIS WITHOUT CONTRAST TECHNIQUE: Multidetector  CT imaging of the chest, abdomen and pelvis was performed following the standard protocol without IV contrast. RADIATION DOSE REDUCTION: This exam was performed according to the departmental dose-optimization program which includes automated exposure control, adjustment of the mA and/or kV according to patient size and/or use of iterative reconstruction technique. COMPARISON:  Chest radiographs 09/20/2023 and 04/09/2020. FINDINGS: CT CHEST FINDINGS Cardiovascular: Severe coronary artery atherosclerosis with lesser involvement of the aorta and great vessels. Mild central enlargement of the pulmonary arteries. There are calcifications of the aortic valve. The heart size is normal. There is no pericardial effusion. Mediastinum/Nodes: There are no enlarged mediastinal, hilar or axillary lymph nodes. Small to moderate  hiatal hernia. The thyroid  gland appears unremarkable. Lungs/Pleura: No pleural effusion or pneumothorax. Scattered moderate subpleural scarring in both lungs. No confluent airspace disease or suspicious pulmonary nodularity. Musculoskeletal/Chest wall: No chest wall mass or suspicious osseous findings. Mild thoracic spondylosis. CT ABDOMEN AND PELVIS FINDINGS Hepatobiliary: There are no suspicious hepatic findings on noncontrast imaging. The gallbladder is surgically absent. No significant biliary dilatation. Pancreas: Unremarkable. No pancreatic ductal dilatation or surrounding inflammatory changes. Spleen: Normal in size without focal abnormality. Adrenals/Urinary Tract: Both adrenal glands appear normal. Possible punctate nonobstructing calculus in the upper pole of the left kidney. No evidence of ureteral calculus, hydronephrosis or perinephric soft tissue stranding. There is asymmetric left renal cortical thinning. There are several low-density renal lesions bilaterally consistent with cysts. In the interpolar region of the right kidney, there is an indeterminate lesion measuring 3.3 cm on image 60/2. This has a density of 26 HU. The bladder appears normal for its degree of distention. Stomach/Bowel: No enteric contrast administered. As above, small to moderate hiatal hernia. The stomach otherwise appears unremarkable for its degree of distention. Small bowel extends into a small right inguinal hernia. No evidence of incarceration or obstruction. The appendix is not visualized, reportedly surgically absent. There are diverticular changes throughout the descending and sigmoid colon without evidence of acute diverticulitis. Vascular/Lymphatic: There are no enlarged abdominal or pelvic lymph nodes. Diffuse aortic and branch vessel atherosclerosis. Reproductive: Moderate enlargement of the prostate gland which protrudes into the bladder base. Other: As above, right inguinal hernia containing small bowel. There is  a left inguinal hernia containing only fat. Otherwise intact abdominal wall. No ascites or pneumoperitoneum. Musculoskeletal: No acute or significant osseous findings. Multilevel spondylosis, advanced at L3-4 and L5-S1 without definite signs of discitis or osteomyelitis. IMPRESSION: 1. No acute findings or explanation for the patient's symptoms in the chest, abdomen or pelvis. 2. Indeterminate right renal lesion, possibly a hemorrhagic or proteinaceous cyst. Recommend further evaluation with renal ultrasound to exclude solid lesion. 3. Small right inguinal hernia containing small bowel without evidence of incarceration or obstruction. 4. Colonic diverticulosis without evidence of acute diverticulitis. 5. Small to moderate hiatal hernia. 6. Multilevel spondylosis without acute osseous findings. 7.  Aortic Atherosclerosis (ICD10-I70.0). Electronically Signed   By: Elmon Hagedorn M.D.   On: 09/20/2023 10:37   DG Femur Min 2 Views Right Result Date: 09/20/2023 CLINICAL DATA:  Fall. EXAM: RIGHT FEMUR 2 VIEWS COMPARISON:  02/28/2022. FINDINGS: No acute fracture. The right femoral head is seated within the acetabulum. Mild-to-moderate degenerative changes of the right hip. Pubic symphysis is anatomically aligned. Severe patellofemoral joint space narrowing of the right knee resulting in bone-on-bone contact and subchondral sclerosis. Mild-to-moderate degenerative changes of the medial and lateral femorotibial compartments. Peripheral vascular calcifications are noted. IMPRESSION: 1. No acute osseous abnormality. 2. Mild-to-moderate degenerative changes of the right  hip. 3. Severe degenerative changes of the patellofemoral joint. Electronically Signed   By: Mannie Seek M.D.   On: 09/20/2023 10:32   DG Chest Portable 1 View Result Date: 09/20/2023 CLINICAL DATA:  Weakness. EXAM: PORTABLE CHEST 1 VIEW COMPARISON:  AP chest 04/09/2020, 04/03/2020, 03/14/2020, 02/05/2019 FINDINGS: Cardiac silhouette and mediastinal  contours are within limits. Mild atherosclerotic calcifications within the aortic arch. Improved aeration of the. No focal airspace opacity. No pulmonary edema, pleural effusion, or pulmonary edema. Moderate to severe multilevel disc space narrowing and bridging osteophytosis of the thoracic spine. IMPRESSION: No acute cardiopulmonary disease process. Electronically Signed   By: Bertina Broccoli M.D.   On: 09/20/2023 08:30     I have personally reviewed the images and agree with the above interpretation.  Labs:    Latest Ref Rng & Units 09/20/2023    7:59 AM 08/17/2023    8:03 AM 01/07/2021   12:33 PM  CBC  WBC 4.0 - 10.5 K/uL 15.1  8.6  7.2   Hemoglobin 13.0 - 17.0 g/dL 19.1  47.8  29.5   Hematocrit 39.0 - 52.0 % 38.2  38.6  37.4   Platelets 150 - 400 K/uL 232  286  269       Latest Ref Rng & Units 09/20/2023    7:59 AM 08/17/2023    8:03 AM 01/07/2021   12:33 PM  BMP  Glucose 70 - 99 mg/dL 621  93  308   BUN 8 - 23 mg/dL 37  32  24   Creatinine 0.61 - 1.24 mg/dL 6.57  8.46  9.62   Sodium 135 - 145 mmol/L 140  137  140   Potassium 3.5 - 5.1 mmol/L 3.9  4.1  4.1   Chloride 98 - 111 mmol/L 108  104  105   CO2 22 - 32 mmol/L 20  25  27    Calcium 8.9 - 10.3 mg/dL 9.6  9.0  9.3         Assessment and Plan: Mr. Ramus is a pleasant 89 y.o. male with history of dementia being admitted after an unwitnessed fall found to have a leukocytosis and elevated lactate.  States that he did not hit his head.  He does have a history of head trauma approximately 1 month and 1 week ago.  Cause significant soft tissue damage requiring stitches on his scalp and on his ear.  His daughter is with him at bedside and some of the history is taken from her.  She is feels like he is at his neurological baseline, he is notably in memory care currently.  His physical exam is nonfocal, he has some baseline left-sided facial weakness which his daughter states has been his normal appearance for a long time.    CT scan  was obtained which demonstrated a subacute to chronic appearing subdural hematoma, with his history of a fall little over a month ago causing scalp trauma and ear trauma most likely this is remote hemorrhage and will not need any acute intervention.  However given his fall that was unwitnessed and his inability to fully recall the situation given his dementia we will plan for repeat head CT.  Should this be stable he can follow-up in neurosurgery clinic in 4 weeks with repeat head CT  Carroll Clamp, MD/MSCR Dept. of Neurosurgery

## 2023-09-20 NOTE — Hospital Course (Addendum)
 89 yo Dementia   Unwitnessed fall.   Watching to rule out sepsis.   First lactate 4.5 hydration. No current source.   Subacute SDH, NSGY will see him repeat   Last found on his knees. Had several recent falls. Here four weeks ago.   Does not appear to hit his head this time.   Slipped and fell.   Thought patient was weak.   Able to urinate okay.     1+ pitting edema, up to knees.   R knee swollen with fluid.   No increased warmth to tuoch.   Discussed with patients family and they

## 2023-09-20 NOTE — ED Notes (Signed)
 Pt was getting OOB. Pt refusing to cooperate to get back up in bed. With help of other RN and EDP , pt was repositioned in bed. Bed alarm is on. Pt became combative with RN. Pt had stated needed to void, then got angry when this RN tried to help him use urinal and threw urinal down onto bed. Daughter in law asking for update from MD re: plan and why pt being admitted.

## 2023-09-20 NOTE — ED Notes (Signed)
 Daughter in law called this RN to bedside for pt to have BM. Went into room with bedpan etc. Pt does not need to have BM now.

## 2023-09-20 NOTE — ED Notes (Signed)
 Helped pt use urinal. Removed sweatpants and tight socks, placed nonslip socks.

## 2023-09-20 NOTE — ED Notes (Signed)
 Pt removed BP cuff and pulse ox. Replaced on pt. Placed coban to IV's.

## 2023-09-20 NOTE — ED Provider Notes (Signed)
 [facility] Provider Note    Event Date/Time   First MD Initiated Contact with Patient 09/20/23 [phone]     (approximate)   History   Chief Complaint: Fall   HPI  Paul Bryan is a 89 y.o. male with a history of dementia, CKD, hypertension who was brought to the ED from [facility] memory care due to an unwitnessed fall.  Patient was found on the floor on his knees, reported generalized weakness.  Patient denies hitting his head, denies pain.  No vomiting.  History is limited due to patient's advanced dementia and confusion        Past Medical History:  Diagnosis Date   Hypertension    Restless leg syndrome     Current Outpatient Rx   Order #: [id number] Class: Historical Med   Order #: [id number] Class: No Print   Order #: [id number] Class: Historical Med   Order #: [id number] Class: Historical Med   Order #: [id number] Class: Historical Med   Order #: [id number] Class: Historical Med   Order #: [id number] Class: Historical Med   Order #: [id number] Class: Historical Med   Order #: [id number] Class: Historical Med   Order #: [id number] Class: Historical Med   Order #: [id number] Class: Historical Med   Order #: [id number] Class: Print   Order #: [id number] Class: Historical Med   Order #: [id number] Class: Historical Med   Order #: [id number] Class: Historical Med   Order #: [id number] Class: Historical Med   Order #: [id number] Class: Print   Order #: [id number] Class: Historical Med    Past Surgical History:  Procedure Laterality Date   ABLATION     APPENDECTOMY     COLONOSCOPY N/A 10/10/2017   Procedure: COLONOSCOPY;  Surgeon: Luke Salaam, MD;  Location: [facility] ENDOSCOPY;  Service: Gastroenterology;  Laterality: N/A;   ESOPHAGOGASTRODUODENOSCOPY N/A 10/10/2017   Procedure: ESOPHAGOGASTRODUODENOSCOPY (EGD);  Surgeon: Luke Salaam, MD;  Location: [facility] ENDOSCOPY;  Service: Gastroenterology;  Laterality: N/A;   FISSURECTOMY      Physical Exam   Triage Vital Signs: ED  Triage Vitals  Encounter Vitals Group     BP 09/20/23 0755 94/66     Systolic BP Percentile --      Diastolic BP Percentile --      Pulse Rate 09/20/23 0755 (!) 112     Resp 09/20/23 0755 16     Temp 09/20/23 0755 97.9 F (36.6 C)     Temp Source 09/20/23 0755 Oral     SpO2 09/20/23 0755 94 %     Weight 09/20/23 0754 171 lb 15.3 oz (78 kg)     Height 09/20/23 0754 5\' 8"  (1.727 m)     Head Circumference --      Peak Flow --      Pain Score 09/20/23 0755 0     Pain Loc --      Pain Education --      Exclude from Growth Chart --     Most recent vital signs: Vitals:   09/20/23 1030 09/20/23 1100  BP: (!) 141/76 (!) 155/78  Pulse: 75 88  Resp: 16 18  Temp:    SpO2:  96%    General: Awake, no distress.  CV:  Good peripheral perfusion.  Tachycardia heart rate 110 Resp:  Normal effort.  Clear to auscultation bilaterally Abd:  No distention.  Soft nontender Other:  Dry oral mucosa.  PERRL, EOMI, no dysarthria.  Full range of motion all extremities without deformity or bony point tenderness   ED Results / Procedures / Treatments  Labs (all labs ordered are listed, but only abnormal results are displayed) Labs Reviewed  COMPREHENSIVE METABOLIC PANEL WITH GFR - Abnormal; Notable for the following components:      Result Value   CO2 20 (*)    Glucose, Bld 111 (*)    BUN 37 (*)    Creatinine, Ser 2.42 (*)    GFR, Estimated 24 (*)    All other components within normal limits  CBC - Abnormal; Notable for the following components:   WBC 15.1 (*)    Hemoglobin 12.3 (*)    HCT 38.2 (*)    All other components within normal limits  URINALYSIS, ROUTINE W REFLEX MICROSCOPIC - Abnormal; Notable for the following components:   Color, Urine YELLOW (*)    APPearance CLEAR (*)    Hgb urine dipstick SMALL (*)    Protein, ur 100 (*)    All other components within normal limits  LACTIC ACID, PLASMA - Abnormal; Notable for the following components:   Lactic Acid, Venous 4.5 (*)     All other components within normal limits  RESP PANEL BY RT-PCR (RSV, FLU A&B, COVID)  [id number]  CULTURE, BLOOD (ROUTINE X 2)  CULTURE, BLOOD (ROUTINE X 2)  LACTIC ACID, PLASMA  CBG MONITORING, ED     EKG Interpreted by me Atrial fibrillation versus sinus arrhythmia, rate of 78.  Normal axis, normal intervals.  Normal QRS ST segments and T waves   RADIOLOGY CT head interpreted by me, shows small right parietal subdural hematoma.  Discussed with radiology.  Chest x-ray interpreted by me, unremarkable.  Radiology report reviewed  CT chest abdomen pelvis nonacute   PROCEDURES:  .Critical Care  Performed by: Jacquie Maudlin, MD Authorized by: Jacquie Maudlin, MD   Critical care provider statement:    Critical care time (minutes):  35   Critical care time was exclusive of:  Separately billable procedures and treating other patients   Critical care was necessary to treat or prevent imminent or life-threatening deterioration of the following conditions:  Sepsis and shock   Critical care was time spent personally by me on the following activities:  Development of treatment plan with patient or surrogate, discussions with consultants, evaluation of patient's response to treatment, examination of patient, obtaining history from patient or surrogate, ordering and performing treatments and interventions, ordering and review of laboratory studies, ordering and review of radiographic studies, pulse oximetry, re-evaluation of patient's condition and review of old charts    MEDICATIONS ORDERED IN ED: Medications  sodium chloride  0.9 % bolus 1,000 mL (0 mLs Intravenous Stopped 09/20/23 0912)  cefTRIAXone  (ROCEPHIN ) 2 g in sodium chloride  0.9 % 100 mL IVPB (0 g Intravenous Stopped 09/20/23 0950)  lactated ringers  bolus 1,500 mL (0 mLs Intravenous Stopped 09/20/23 1140)     IMPRESSION / MDM / ASSESSMENT AND PLAN / ED COURSE  I reviewed the triage vital signs and the nursing notes.  DDx:  Dehydration, AKI, electrolyte derangement, UTI, pneumonia, sepsis  Patient's presentation is most consistent with acute presentation with potential threat to life or bodily function.  Patient sent to the ED due to generalized weakness.  Found to have blood pressure of 90/50, tachycardia, concerning for sepsis.  Will check labs, give IV fluid bolus.  No signs of trauma on exam.   Clinical Course as of 09/20/23 1141  Tue Sep 20, 2023  0937 Lactate 4.5. cxr and ua clear. Still denies pain. Has bruising to right thigh. Will obtain CT for further eval due  to suspected sepsis and no localizing exam or history findings. [PS]  1049 CTH d/w radiologist - small subacute SDH. D/w nsgy who will eval pt. CT CAP nonacute. [PS]  1122 CTH d/w nsgy who will eval pt [PS]    Clinical Course User Index [PS] Jacquie Maudlin, MD    ----------------------------------------- 11:41 AM on 09/20/2023 ----------------------------------------- Case discussed with hospitalist   FINAL CLINICAL IMPRESSION(S) / ED DIAGNOSES   Final diagnoses:  Suspected sepsis  CKD (chronic kidney disease) stage 4, GFR 15-29 ml/min (HCC)  Chronic dementia (HCC)  Subdural hematoma (HCC)     Rx / DC Orders   ED Discharge Orders     None        Note:  This document was prepared using Dragon voice recognition software and may include unintentional dictation errors.   Jacquie Maudlin, MD 09/20/23 1141

## 2023-09-20 NOTE — Plan of Care (Signed)

## 2023-09-20 NOTE — ED Notes (Signed)
 EDP at bedside to update daughter in law about plan of care.

## 2023-09-20 NOTE — ED Notes (Signed)
 Pt eating food brought by family. Pt removed SPO2 sensor again.

## 2023-09-20 NOTE — Telephone Encounter (Signed)
 Carroll Clamp, MD  [facility] Admin Clinic: [facility] Timeline: 4wks Tests to order: head ct

## 2023-09-20 NOTE — ED Triage Notes (Signed)
 Presents via EMS from [facility]  [facility] unwitnessed fall  Staff found him on his knees    Per EMS his legs gave out  weakness

## 2023-09-20 NOTE — ED Notes (Signed)
 Helped pt use urinal. Pt very confused. Daughter in law going to get food.

## 2023-09-21 ENCOUNTER — Observation Stay

## 2023-09-21 DIAGNOSIS — F0390 Unspecified dementia without behavioral disturbance: Secondary | ICD-10-CM | POA: Insufficient documentation

## 2023-09-21 DIAGNOSIS — S065X0A Traumatic subdural hemorrhage without loss of consciousness, initial encounter: Secondary | ICD-10-CM | POA: Diagnosis not present

## 2023-09-21 DIAGNOSIS — I10 Essential (primary) hypertension: Secondary | ICD-10-CM

## 2023-09-21 DIAGNOSIS — R296 Repeated falls: Secondary | ICD-10-CM | POA: Diagnosis not present

## 2023-09-21 DIAGNOSIS — S065XAA Traumatic subdural hemorrhage with loss of consciousness status unknown, initial encounter: Secondary | ICD-10-CM | POA: Diagnosis not present

## 2023-09-21 LAB — COMPREHENSIVE METABOLIC PANEL WITH GFR
ALT: 19 U/L (ref 0–44)
AST: 40 U/L (ref 15–41)
Albumin: 3.3 g/dL — ABNORMAL LOW (ref 3.5–5.0)
Alkaline Phosphatase: 40 U/L (ref 38–126)
Anion gap: 5 (ref 5–15)
BUN: 27 mg/dL — ABNORMAL HIGH (ref 8–23)
CO2: 25 mmol/L (ref 22–32)
Calcium: 8.6 mg/dL — ABNORMAL LOW (ref 8.9–10.3)
Chloride: 109 mmol/L (ref 98–111)
Creatinine, Ser: 1.75 mg/dL — ABNORMAL HIGH (ref 0.61–1.24)
GFR, Estimated: 36 mL/min — ABNORMAL LOW (ref >60)
Glucose, Bld: 94 mg/dL (ref 70–99)
Potassium: 3.6 mmol/L (ref 3.5–5.1)
Sodium: 139 mmol/L (ref 135–145)
Total Bilirubin: 0.8 mg/dL (ref 0.0–1.2)
Total Protein: 6.2 g/dL — ABNORMAL LOW (ref 6.5–8.1)

## 2023-09-21 LAB — CBC WITH DIFFERENTIAL/PLATELET
Abs Immature Granulocytes: 0.05 10*3/uL (ref 0.00–0.07)
Basophils Absolute: 0.1 10*3/uL (ref 0.0–0.1)
Basophils Relative: 1 %
Eosinophils Absolute: 0.2 10*3/uL (ref 0.0–0.5)
Eosinophils Relative: 3 %
HCT: 33.8 % — ABNORMAL LOW (ref 39.0–52.0)
Hemoglobin: 11 g/dL — ABNORMAL LOW (ref 13.0–17.0)
Immature Granulocytes: 1 %
Lymphocytes Relative: 19 %
Lymphs Abs: 1.4 10*3/uL (ref 0.7–4.0)
MCH: 28.1 pg (ref 26.0–34.0)
MCHC: 32.5 g/dL (ref 30.0–36.0)
MCV: 86.4 fL (ref 80.0–100.0)
Monocytes Absolute: 1.1 10*3/uL — ABNORMAL HIGH (ref 0.1–1.0)
Monocytes Relative: 15 %
Neutro Abs: 4.5 10*3/uL (ref 1.7–7.7)
Neutrophils Relative %: 61 %
Platelets: 262 10*3/uL (ref 150–400)
RBC: 3.91 MIL/uL — ABNORMAL LOW (ref 4.22–5.81)
RDW: 15.7 % — ABNORMAL HIGH (ref 11.5–15.5)
WBC: 7.3 10*3/uL (ref 4.0–10.5)
nRBC: 0 % (ref 0.0–0.2)

## 2023-09-21 LAB — PHOSPHORUS: Phosphorus: 2.9 mg/dL (ref 2.5–4.6)

## 2023-09-21 LAB — APTT: aPTT: 32 s (ref 24–36)

## 2023-09-21 LAB — PROTIME-INR
INR: 1 (ref 0.8–1.2)
Prothrombin Time: 13.6 s (ref 11.4–15.2)

## 2023-09-21 LAB — MAGNESIUM: Magnesium: 2.1 mg/dL (ref 1.7–2.4)

## 2023-09-21 NOTE — Plan of Care (Signed)

## 2023-09-21 NOTE — TOC Progression Note (Signed)
 Transition of Care [facility]) - Progression Note    Patient Details  Name: Paul Bryan MRN: [id number] Date of Birth: 09-08-1929  Transition of Care [facility]) CM/SW Contact  Joslyn Nim, RN Phone Number: 09/21/2023, 4:53 PM  Clinical Narrative:    CM notified Pam that d/c summary faxed and transportation arrange with [facility] which will be late this evening. CM let her know that there are about 4-5 people ahead of patient per Myrtie Atkinson at [employer].  CM gave nurse the number to call report.  CM notified patient's daughter in law Cheri via phone of d/c today and transportation coming later this evening. She is in agreement   Expected Discharge Plan: Assisted Living (Patient is in the memory care unit)    Expected Discharge Plan and Services       Living arrangements for the past 2 months: Assisted Living Facility [facility]) Expected Discharge Date: 09/21/23                                     Social Determinants of Health (SDOH) Interventions SDOH Screenings   Food Insecurity: No Food Insecurity (09/20/2023)  Housing: Low Risk  (09/20/2023)  Transportation Needs: No Transportation Needs (09/20/2023)  Utilities: Not At Risk (09/20/2023)  Financial Resource Strain: Low Risk  (02/04/2019)  Physical Activity: Inactive (02/04/2019)  Social Connections: Socially Isolated (09/20/2023)  Stress: No Stress Concern Present (02/04/2019)  Tobacco Use: Medium Risk (09/20/2023)    Readmission Risk Interventions     No data to display

## 2023-09-21 NOTE — TOC Initial Note (Addendum)
 Transition of Care [facility]) - Initial/Assessment Note    Patient Details  Name: Paul Bryan MRN: [id number] Date of Birth: 09/21/1929  Transition of Care [facility]) CM/SW Contact:    Joslyn Nim, RN Phone Number: 09/21/2023, 2:10 PM  Clinical Narrative:                   CM spoke with daughter in law  Jaqueline Hassebrock, [phone] at bedside at the time of the assessment. She states that patient lives at [facility] assisting living & memory care facility. Patient lives on the memory care side. She states that patient is receiving PT on MWF at the facility.  Daughter in law states that patient uses a rolling walker.   CM called the facility, [phone] but had to leave a message. Therapy ordered.   Expected Discharge Plan: Assisted Living (pending therapy recommendations)     Patient Goals and CMS Choice            Expected Discharge Plan and Services       Living arrangements for the past 2 months: Assisted Living Facility [facility])                                      Prior Living Arrangements/Services Living arrangements for the past 2 months: Assisted Living Facility [facility]) Lives with:: Facility Resident Patient language and need for interpreter reviewed:: No            Current home services: Home PT, DME    Activities of Daily Living   ADL Screening (condition at time of admission) Independently performs ADLs?: No Does the patient have a NEW difficulty with bathing/dressing/toileting/self-feeding that is expected to last >3 days?: No Does the patient have a NEW difficulty with getting in/out of bed, walking, or climbing stairs that is expected to last >3 days?: No Does the patient have a NEW difficulty with communication that is expected to last >3 days?: No Is the patient deaf or have difficulty hearing?: Yes Does the patient have difficulty seeing, even when wearing glasses/contacts?: Yes Does the patient have difficulty concentrating,  remembering, or making decisions?: Yes  Permission Sought/Granted                  Emotional Assessment Appearance:: Appears stated age     Orientation: :  (patient was resting  eyes closed chest raising and falling. No distress noted)   Psych Involvement: No (comment)  Admission diagnosis:  Subdural hematoma (HCC) [S06.5XAA] CKD (chronic kidney disease) stage 4, GFR 15-29 ml/min (HCC) [N18.4] Sepsis (HCC) [A41.9] Chronic dementia (HCC) [F03.90] Suspected sepsis [R68.89] Patient Active Problem List   Diagnosis Date Noted   Sepsis (HCC) 09/20/2023   Subdural hematoma (HCC) 09/20/2023   Pneumonia due to COVID-19 virus 04/03/2020   Hypertension    CKD (chronic kidney disease), stage III (HCC)    Dehydration    BPH (benign prostatic hyperplasia)    Acute respiratory failure (HCC)    Altered mental status 02/05/2019   GIB (gastrointestinal bleeding) 10/09/2017   PCP:  Nestor Banter, MD Pharmacy:  No Pharmacies Listed    Social Drivers of Health (SDOH) Social History: SDOH Screenings   Food Insecurity: No Food Insecurity (09/20/2023)  Housing: Low Risk  (09/20/2023)  Transportation Needs: No Transportation Needs (09/20/2023)  Utilities: Not At Risk (09/20/2023)  Financial Resource Strain: Low Risk  (02/04/2019)  Physical Activity: Inactive (  02/04/2019)  Social Connections: Socially Isolated (09/20/2023)  Stress: No Stress Concern Present (02/04/2019)  Tobacco Use: Medium Risk (09/20/2023)   SDOH Interventions:     Readmission Risk Interventions     No data to display

## 2023-09-21 NOTE — Care Management Obs Status (Signed)
 MEDICARE OBSERVATION STATUS NOTIFICATION   Patient Details  Name: Paul Bryan MRN: [id number] Date of Birth: 1929/08/25   Medicare Observation Status Notification Given:  Yes    Anise Kerns 09/21/2023, 12:08 PM

## 2023-09-21 NOTE — Discharge Summary (Addendum)
 Physician Discharge Summary   Patient: Paul Bryan MRN: [id number] DOB: [birth year]  Admit date:     09/20/2023  Discharge date: 09/21/23  Discharge Physician: Aisha Hove   PCP: Nestor Banter, MD   Recommendations at discharge:    PCP follow up in 1 week. Neurosurgery follow up in 1 month with repeat CT head.  Discharge Diagnoses: Principal Problem:   Subdural hematoma (HCC) Active Problems:   Hypertension   BPH (benign prostatic hyperplasia)   Dementia without behavioral disturbance (HCC)  Resolved Problems:   * No resolved hospital problems. *  Hospital Course: Paul Bryan is a 89 y.o. male with medical history significant of dementia, HTN, rls, and frequent falls.   History is provided by the patient's daughter-in-law who is at bedside as patient does not recall much of the events surrounding his fall.     Patient has had multiple falls over the last year.  His most recent was about a month ago in which he sustained a laceration to the scalp and into his right ear.  CT head at that time was negative for brain bleed.  Per patient's daughter-in-law, the patient sleeps on the covers when trying to get in his bed.  They have made multiple changes to try and prevent future falls.  She believes patient may require 24 hours sitter given his frequent falls.    Patient denies any dysuria, dyspnea, chest pain, palpitations.   Assessment and Plan: Sub dural hematoma: Appears subacute. Neurosurgery consult appreciated, repeat CT remained stable, advised outpatient follow up in 1 month with repeat CT head. He denies headache, dizziness, able to work with PT. I stopped Aspirin  given high risk of falls, head trauma.   Frequent falls- Reportedly mechanical per daughter in law. Could be a component of orthostasis. Received IV fluids, he worked with PT can return to long term memory care unit.   Dementia- Patient on trazodone , ativan  PRN, and galantamine .  Continue  current meds at memory unit. Fall precautions. Delirium precautions.   SIRS on presentation (Resolved)  Lactic acid, WBC count improved with fluids. No need for antibiotics.   HTN:  Resumed home anithypertensive   AKI on CKD 3b  NAGMA mild Scr 2.42 improved to baseline around 1.75.       Consultants: Neurosurgery Procedures performed: none  Disposition: Long term care facility Diet recommendation:  Discharge Diet Orders (From admission, onward)     Start     Ordered   09/21/23 0000  Diet - low sodium heart healthy        09/21/23 1616           Cardiac diet DISCHARGE MEDICATION: Allergies as of 09/21/2023   No Known Allergies      Medication List     STOP taking these medications    albuterol  108 (90 Base) MCG/ACT inhaler Commonly known as: VENTOLIN  HFA   aspirin  EC 81 MG tablet   predniSONE  10 MG tablet Commonly known as: DELTASONE    valACYclovir  1000 MG tablet Commonly known as: Valtrex        TAKE these medications    acetaminophen  500 MG tablet Commonly known as: TYLENOL  Take 500 mg by mouth 3 (three) times daily.   amLODipine  5 MG tablet Commonly known as: NORVASC  Take 5 mg by mouth daily.   BOOST PO Take by mouth.   diclofenac Sodium 1 % Gel Commonly known as: VOLTAREN Apply 2 g topically 3 (three) times daily.   divalproex 250 MG 24 hr  tablet Commonly known as: DEPAKOTE ER Take 250 mg by mouth daily.   famotidine  40 MG tablet Commonly known as: PEPCID  Take 40 mg by mouth daily.   galantamine  8 MG tablet Commonly known as: RAZADYNE  Take 8 mg by mouth daily.   LORazepam  0.5 MG tablet Commonly known as: ATIVAN  Take 1 tablet by mouth every 4 (four) hours as needed.   melatonin 5 MG Tabs Take 1 tablet by mouth at bedtime.   Optase Comfort Dry Eye 1 % Soln Generic drug: Glycerin (PF) Apply 1 drop to eye at bedtime.   pantoprazole  40 MG tablet Commonly known as: PROTONIX  Take 40 mg by mouth daily.   senna 8.6 MG  Tabs tablet Commonly known as: SENOKOT Take 1 tablet by mouth daily.   simvastatin  20 MG tablet Commonly known as: ZOCOR  Take 20 mg by mouth at bedtime.   tamsulosin  0.4 MG Caps capsule Commonly known as: FLOMAX  Take 0.4 mg by mouth daily.   traZODone  50 MG tablet Commonly known as: DESYREL  Take 50 mg by mouth at bedtime.   Vitamin D  (Ergocalciferol ) 50000 units Caps Take 1 capsule by mouth daily.        Discharge Exam: Filed Weights   09/20/23 0754  Weight: 78 kg      09/21/2023    3:53 PM 09/21/2023    3:28 AM 09/20/2023    8:23 PM  Vitals with BMI  Systolic 168 168 161  Diastolic 70 68 79  Pulse 83 68 69   General - Elderly Caucasian male, no apparent distress HEENT - PERRLA, EOMI, atraumatic head, non tender sinuses. Lung - Clear, no rales, rhonchi, wheezes. Heart - S1, S2 heard, no murmurs, rubs, no pedal edema. Abdomen - Soft, non tender, bowel sounds good Neuro - Alert, awake able to answer, non focal exam. Skin - Warm and dry.   Condition at discharge: stable  The results of significant diagnostics from this hospitalization (including imaging, microbiology, ancillary and laboratory) are listed below for reference.   Imaging Studies: CT HEAD WO CONTRAST ( ) Result Date: 09/21/2023 CLINICAL DATA:  89 year old male with right side subdural hematoma. Unwitnessed fall. EXAM: CT HEAD WITHOUT CONTRAST TECHNIQUE: Contiguous axial images were obtained from the base of the skull through the vertex without intravenous contrast. RADIATION DOSE REDUCTION: This exam was performed according to the departmental dose-optimization program which includes automated exposure control, adjustment of the mA and/or kV according to patient size and/or use of iterative reconstruction technique. COMPARISON:  Head CT yesterday. FINDINGS: Brain: Mixed density but mostly hypodense right posterior and superior convexity subdural hematoma measures 45 mm in thickness and is stable since  yesterday (coronal image 45). Overlying calvarium intact. No new intracranial hemorrhage identified. Underlying cerebral volume loss and disproportionate atrophy of the mesial temporal lobes. Ex vacuo appearing ventricular enlargement. No intracranial mass effect or midline shift. Basilar cisterns remain patent. Stable gray-white matter differentiation throughout the brain. Vascular: Calcified atherosclerosis at the skull base. No suspicious intracranial vascular hyperdensity. Skull: Stable.  No skull fracture identified. Sinuses/Orbits: Visualized paranasal sinuses and mastoids are stable and well aerated. Other: Stable orbit and scalp soft tissues. IMPRESSION: 1. Stable small 4-5 mm mixed density right superior convexity Subdural Hematoma. 2. No skull fracture or intracranial mass effect. 3. No new intracranial abnormality. Mesial temporal atrophy, Cerebral Atrophy (ICD10-G31.9). Electronically Signed   By: Marlise Simpers M.D.   On: 09/21/2023 12:59   CT Head Wo Contrast Addendum Date: 09/20/2023 ADDENDUM REPORT: 09/20/2023 13:06 ADDENDUM: These results  were called by telephone at the time of interpretation on 09/20/2023 at 10:27 AM to provider PHILLIP STAFFORD , who verbally acknowledged these results. Electronically Signed   By: Denny Flack M.D.   On: 09/20/2023 13:06   Result Date: 09/20/2023 CLINICAL DATA:  Mental status change, unwitnessed fall. EXAM: CT HEAD WITHOUT CONTRAST TECHNIQUE: Contiguous axial images were obtained from the base of the skull through the vertex without intravenous contrast. RADIATION DOSE REDUCTION: This exam was performed according to the departmental dose-optimization program which includes automated exposure control, adjustment of the mA and/or kV according to patient size and/or use of iterative reconstruction technique. COMPARISON:  CT head 08/17/2023. FINDINGS: Brain: Mixed attenuation subdural collection over the right parietal lobe measuring up to 7 mm in maximum thickness with  minimal associated underlying mass effect. No midline shift. Subdural collection is primary hypoattenuating with a few possible areas of internal septation. No CT evidence of completed large territory infarct. Moderate parenchymal volume loss with temporal lobe predominance. Nonspecific hypoattenuation in the periventricular and subcortical white matter favored to reflect chronic microvascular ischemic changes. Ventricles: The ventricles are normal. Vascular: Atherosclerotic calcifications of the carotid siphons and intracranial vertebral arteries. No hyperdense vessel. Skull: No acute or aggressive finding. Orbits: Orbits are symmetric. Sinuses: Mucosal thickening in the bilateral maxillary sinuses. Other: Mastoid air cells are clear. IMPRESSION: Mixed attenuation subdural collection over the right parietal lobe is new since 08/17/2023 suggestive of subacute subdural hematoma. Minimal associated mass effect. No midline shift. Chronic microvascular ischemic changes and parenchymal volume loss similar to prior. Electronically Signed: By: Denny Flack M.D. On: 09/20/2023 10:22   CT CHEST ABDOMEN PELVIS WO CONTRAST Result Date: 09/20/2023 CLINICAL DATA:  Sepsis.  Unwitnessed fall with leg weakness. EXAM: CT CHEST, ABDOMEN AND PELVIS WITHOUT CONTRAST TECHNIQUE: Multidetector CT imaging of the chest, abdomen and pelvis was performed following the standard protocol without IV contrast. RADIATION DOSE REDUCTION: This exam was performed according to the departmental dose-optimization program which includes automated exposure control, adjustment of the mA and/or kV according to patient size and/or use of iterative reconstruction technique. COMPARISON:  Chest radiographs 09/20/2023 and 04/09/2020. FINDINGS: CT CHEST FINDINGS Cardiovascular: Severe coronary artery atherosclerosis with lesser involvement of the aorta and great vessels. Mild central enlargement of the pulmonary arteries. There are calcifications of the  aortic valve. The heart size is normal. There is no pericardial effusion. Mediastinum/Nodes: There are no enlarged mediastinal, hilar or axillary lymph nodes. Small to moderate hiatal hernia. The thyroid  gland appears unremarkable. Lungs/Pleura: No pleural effusion or pneumothorax. Scattered moderate subpleural scarring in both lungs. No confluent airspace disease or suspicious pulmonary nodularity. Musculoskeletal/Chest wall: No chest wall mass or suspicious osseous findings. Mild thoracic spondylosis. CT ABDOMEN AND PELVIS FINDINGS Hepatobiliary: There are no suspicious hepatic findings on noncontrast imaging. The gallbladder is surgically absent. No significant biliary dilatation. Pancreas: Unremarkable. No pancreatic ductal dilatation or surrounding inflammatory changes. Spleen: Normal in size without focal abnormality. Adrenals/Urinary Tract: Both adrenal glands appear normal. Possible punctate nonobstructing calculus in the upper pole of the left kidney. No evidence of ureteral calculus, hydronephrosis or perinephric soft tissue stranding. There is asymmetric left renal cortical thinning. There are several low-density renal lesions bilaterally consistent with cysts. In the interpolar region of the right kidney, there is an indeterminate lesion measuring 3.3 cm on image 60/2. This has a density of 26 HU. The bladder appears normal for its degree of distention. Stomach/Bowel: No enteric contrast administered. As above, small to moderate hiatal hernia. The  stomach otherwise appears unremarkable for its degree of distention. Small bowel extends into a small right inguinal hernia. No evidence of incarceration or obstruction. The appendix is not visualized, reportedly surgically absent. There are diverticular changes throughout the descending and sigmoid colon without evidence of acute diverticulitis. Vascular/Lymphatic: There are no enlarged abdominal or pelvic lymph nodes. Diffuse aortic and branch vessel  atherosclerosis. Reproductive: Moderate enlargement of the prostate gland which protrudes into the bladder base. Other: As above, right inguinal hernia containing small bowel. There is a left inguinal hernia containing only fat. Otherwise intact abdominal wall. No ascites or pneumoperitoneum. Musculoskeletal: No acute or significant osseous findings. Multilevel spondylosis, advanced at L3-4 and L5-S1 without definite signs of discitis or osteomyelitis. IMPRESSION: 1. No acute findings or explanation for the patient's symptoms in the chest, abdomen or pelvis. 2. Indeterminate right renal lesion, possibly a hemorrhagic or proteinaceous cyst. Recommend further evaluation with renal ultrasound to exclude solid lesion. 3. Small right inguinal hernia containing small bowel without evidence of incarceration or obstruction. 4. Colonic diverticulosis without evidence of acute diverticulitis. 5. Small to moderate hiatal hernia. 6. Multilevel spondylosis without acute osseous findings. 7.  Aortic Atherosclerosis (ICD10-I70.0). Electronically Signed   By: Elmon Hagedorn M.D.   On: 09/20/2023 10:37   DG Femur Min 2 Views Right Result Date: 09/20/2023 CLINICAL DATA:  Fall. EXAM: RIGHT FEMUR 2 VIEWS COMPARISON:  02/28/2022. FINDINGS: No acute fracture. The right femoral head is seated within the acetabulum. Mild-to-moderate degenerative changes of the right hip. Pubic symphysis is anatomically aligned. Severe patellofemoral joint space narrowing of the right knee resulting in bone-on-bone contact and subchondral sclerosis. Mild-to-moderate degenerative changes of the medial and lateral femorotibial compartments. Peripheral vascular calcifications are noted. IMPRESSION: 1. No acute osseous abnormality. 2. Mild-to-moderate degenerative changes of the right hip. 3. Severe degenerative changes of the patellofemoral joint. Electronically Signed   By: Mannie Seek M.D.   On: 09/20/2023 10:32   DG Chest Portable 1 View Result  Date: 09/20/2023 CLINICAL DATA:  Weakness. EXAM: PORTABLE CHEST 1 VIEW COMPARISON:  AP chest 04/09/2020, 04/03/2020, 03/14/2020, 02/05/2019 FINDINGS: Cardiac silhouette and mediastinal contours are within limits. Mild atherosclerotic calcifications within the aortic arch. Improved aeration of the. No focal airspace opacity. No pulmonary edema, pleural effusion, or pulmonary edema. Moderate to severe multilevel disc space narrowing and bridging osteophytosis of the thoracic spine. IMPRESSION: No acute cardiopulmonary disease process. Electronically Signed   By: Bertina Broccoli M.D.   On: 09/20/2023 08:30    Microbiology: Results for orders placed or performed during the hospital encounter of 09/20/23  Blood Culture (routine x 2)     Status: None (Preliminary result)   Collection Time: 09/20/23  8:30 AM   Specimen: BLOOD  Result Value Ref Range Status   Specimen Description   Final    BLOOD Blood Culture results may not be optimal due to an inadequate volume of blood received in culture bottles   Special Requests   Final    BOTTLES DRAWN AEROBIC AND ANAEROBIC BLOOD RIGHT FOREARM   Culture   Final    NO GROWTH < 24 HOURS Performed at [facility], [address], [place], [place] [address]    Report Status PENDING  Incomplete  Blood Culture (routine x 2)     Status: None (Preliminary result)   Collection Time: 09/20/23  8:30 AM   Specimen: BLOOD  Result Value Ref Range Status   Specimen Description BLOOD Blood Culture adequate volume  Final   Special  Requests   Final    BOTTLES DRAWN AEROBIC AND ANAEROBIC LEFT ANTECUBITAL   Culture   Final    NO GROWTH < 24 HOURS Performed at [facility], [address] [place], [place] [address]    Report Status PENDING  Incomplete  Resp panel by RT-PCR (RSV, Flu A&B, Covid) Anterior Nasal Swab     Status: None   Collection Time: 09/20/23  9:24 AM   Specimen: Anterior Nasal Swab  Result Value Ref Range Status   SARS Coronavirus 2  by RT PCR NEGATIVE NEGATIVE Final    Comment: (NOTE) SARS-CoV-2 target nucleic acids are NOT DETECTED.  The SARS-CoV-2 RNA is generally detectable in upper respiratory specimens during the acute phase of infection. The lowest concentration of SARS-CoV-2 viral copies this assay can detect is 138 copies/mL. A negative result does not preclude SARS-Cov-2 infection and should not be used as the sole basis for treatment or other patient management decisions. A negative result may occur with  improper specimen collection/handling, submission of specimen other than nasopharyngeal swab, presence of viral mutation(s) within the areas targeted by this assay, and inadequate number of viral copies(<138 copies/mL). A negative result must be combined with clinical observations, patient history, and epidemiological information. The expected result is Negative.  Fact Sheet for Patients:  BloggerCourse.com  Fact Sheet for Healthcare Providers:  SeriousBroker.it  This test is no t yet approved or cleared by the [place]  FDA and  has been authorized for detection and/or diagnosis of SARS-CoV-2 by FDA under an Emergency Use Authorization (EUA). This EUA will remain  in effect (meaning this test can be used) for the duration of the COVID-19 declaration under Section 564(b)(1) of the Act, 21 U.S.C.section 360bbb-3(b)(1), unless the authorization is terminated  or revoked sooner.       Influenza A by PCR NEGATIVE NEGATIVE Final   Influenza B by PCR NEGATIVE NEGATIVE Final    Comment: (NOTE) The Xpert Xpress SARS-CoV-2/FLU/RSV plus assay is intended as an aid in the diagnosis of influenza from Nasopharyngeal swab specimens and should not be used as a sole basis for treatment. Nasal washings and aspirates are unacceptable for Xpert Xpress SARS-CoV-2/FLU/RSV testing.  Fact Sheet for Patients: BloggerCourse.com  Fact  Sheet for Healthcare Providers: SeriousBroker.it  This test is not yet approved or cleared by the [place]  FDA and has been authorized for detection and/or diagnosis of SARS-CoV-2 by FDA under an Emergency Use Authorization (EUA). This EUA will remain in effect (meaning this test can be used) for the duration of the COVID-19 declaration under Section 564(b)(1) of the Act, 21 U.S.C. section 360bbb-3(b)(1), unless the authorization is terminated or revoked.     Resp Syncytial Virus by PCR NEGATIVE NEGATIVE Final    Comment: (NOTE) Fact Sheet for Patients: BloggerCourse.com  Fact Sheet for Healthcare Providers: SeriousBroker.it  This test is not yet approved or cleared by the [place]  FDA and has been authorized for detection and/or diagnosis of SARS-CoV-2 by FDA under an Emergency Use Authorization (EUA). This EUA will remain in effect (meaning this test can be used) for the duration of the COVID-19 declaration under Section 564(b)(1) of the Act, 21 U.S.C. section 360bbb-3(b)(1), unless the authorization is terminated or revoked.  Performed at [facility], [address] [place], [place] [address]     Labs: CBC: Recent Labs  Lab 09/20/23 0759 09/21/23 0613  WBC 15.1* 7.3  NEUTROABS  --  4.5  HGB 12.3* 11.0*  HCT 38.2* 33.8*  MCV 87.2 86.4  PLT 232 262   Basic Metabolic Panel: Recent Labs  Lab 09/20/23 0759 09/21/23 0613  NA 140 139  K 3.9 3.6  CL 108 109  CO2 20* 25  GLUCOSE 111* 94  BUN 37* 27*  CREATININE 2.42* 1.75*  CALCIUM 9.6 8.6*  MG  --  2.1  PHOS  --  2.9   Liver Function Tests: Recent Labs  Lab 09/20/23 0759 09/21/23 0613  AST 30 40  ALT 14 19  ALKPHOS 46 40  BILITOT 0.6 0.8  PROT 7.5 6.2*  ALBUMIN 4.0 3.3*   CBG: No results for input(s): "GLUCAP" in the last 168 hours.  Discharge time spent: 34 minutes.  Signed: Aisha Hove,  MD [facility] 09/21/2023

## 2023-09-21 NOTE — Evaluation (Addendum)
 Occupational Therapy Evaluation Patient Details Name: Paul Bryan MRN: [id number] DOB: May 24, 1929 Today's Date: 09/21/2023   History of Present Illness   Pt is a 89 year old male admitted with subacute SDH, frequent falls, SIRS (resolved)     PMH significant for  dementia, HTN, rls, and frequent falls.     Clinical Impressions Chart reviewed to date, pt greeted in bed, oriented to self only, follows one step directions with increased time and multi modal cues. Daughter in law present throughout. PTA pt [facility] at [facility] care has assist for ADL/IADL, amb with RW. Pt presents with deficits in activity tolerance, balance, cognition, strength, affecting safe and optimal ADL completion. Bed mobility completed with MOD A, STS with MOD A, step pivot transfer to bedside chair with MOD A with RW. LB dressing completed with MAX A, UB dressing with MOD A. Pt requires frequent multi modal cueing throughout. Pt is left in bedside chair, all needs met. OT will continue to follow.      If plan is discharge home, recommend the following:   A lot of help with walking and/or transfers;A lot of help with bathing/dressing/bathroom;Supervision due to cognitive status     Functional Status Assessment   Patient has had a recent decline in their functional status and demonstrates the ability to make significant improvements in function in a reasonable and predictable amount of time.     Equipment Recommendations   Wheelchair (measurements OT)     Recommendations for Other Services         Precautions/Restrictions   Precautions Precautions: Fall Recall of Precautions/Restrictions: Impaired     Mobility Bed Mobility Overal bed mobility: Needs Assistance Bed Mobility: Supine to Sit     Supine to sit: Min assist (frequent mutli modal cueing for technique)          Transfers Overall transfer level: Needs assistance Equipment used: Rolling walker (2 wheels) Transfers:  Sit to/from Stand Sit to Stand: Mod assist           General transfer comment: frequent multi modal cues for technique      Balance Overall balance assessment: Needs assistance Sitting-balance support: Feet supported Sitting balance-Leahy Scale: Fair     Standing balance support: Bilateral upper extremity supported, During functional activity, Reliant on assistive device for balance Standing balance-Leahy Scale: Fair                             ADL either performed or assessed with clinical judgement   ADL Overall ADL's : Needs assistance/impaired Eating/Feeding: Supervision/ safety   Grooming: Wash/dry face;Sitting;Supervision/safety               Lower Body Dressing: Maximal assistance   Toilet Transfer: Moderate assistance;Rolling walker (2 wheels);Cueing for safety;Cueing for sequencing Toilet Transfer Details (indicate cue type and reason): step pivot to bedside chair, simulated Toileting- Clothing Manipulation and Hygiene: Maximal assistance;Sit to/from stand;Sitting/lateral lean Toileting - Clothing Manipulation Details (indicate cue type and reason): incontient urine, pt wears briefs at baseline             Vision Patient Visual Report: No change from baseline       Perception         Praxis         Pertinent Vitals/Pain Pain Assessment Pain Assessment: PAINAD Breathing: normal Negative Vocalization: none Facial Expression: smiling or inexpressive Body Language: relaxed Consolability: no need to console PAINAD Score: 0 Pain  Intervention(s): Monitored during session     Extremity/Trunk Assessment Upper Extremity Assessment Upper Extremity Assessment: Generalized weakness   Lower Extremity Assessment Lower Extremity Assessment: Generalized weakness (B knees are "sore" DIL reports they have pursued cortizone shots)       Communication Communication Communication: Impaired Factors Affecting Communication: Hearing  impaired   Cognition Arousal: Alert Behavior During Therapy: WFL for tasks assessed/performed Cognition: History of cognitive impairments, Cognition impaired   Orientation impairments: Place, Time, Situation Awareness: Intellectual awareness impaired, Online awareness impaired Memory impairment (select all impairments): Short-term memory, Non-declarative long-term memory, Declarative long-term memory, Working memory Attention impairment (select first level of impairment): Focused attention Executive functioning impairment (select all impairments): Reasoning, Problem solving OT - Cognition Comments: baseline dementia                 Following commands: Impaired Following commands impaired: Follows one step commands with increased time     Cueing  General Comments   Cueing Techniques: Verbal cues;Gestural cues;Visual cues;Tactile cues      Exercises Other Exercises Other Exercises: edu re role of OT, role of rehab, discharge recommendations   Shoulder Instructions      Home Living Family/patient expects to be discharged to:: Skilled nursing facility                                 Additional Comments: pt lives in memory care at [facility]      Prior Functioning/Environment Prior Level of Function : Needs assist  Cognitive Assist : ADLs (cognitive)   ADLs (Cognitive): Step by step cues Physical Assist : ADLs (physical)   ADLs (physical): Bathing;Dressing;Toileting;IADLs Mobility Comments: amb with RW, frequent falls ADLs Comments: DIL reports supervision and assist with all ADL/IADL    OT Problem List: Decreased activity tolerance;Decreased knowledge of use of DME or AE;Decreased strength;Impaired balance (sitting and/or standing);Decreased safety awareness   OT Treatment/Interventions: Self-care/ADL training;DME and/or AE instruction;Therapeutic activities;Balance training;Therapeutic exercise;Neuromuscular education;Energy  conservation;Patient/family education      OT Goals(Current goals can be found in the care plan section)   Acute Rehab OT Goals Patient Stated Goal: feel better OT Goal Formulation: With patient Time For Goal Achievement: 10/05/23 Potential to Achieve Goals: Good ADL Goals Pt Will Perform Grooming: sitting;standing;with supervision Pt Will Perform Lower Body Dressing: with min assist;sitting/lateral leans;sit to/from stand Pt Will Transfer to Toilet: with contact guard assist;ambulating Pt Will Perform Toileting - Clothing Manipulation and hygiene: with min assist;sitting/lateral leans;sit to/from stand   OT Frequency:  Min 2X/week    Co-evaluation              AM-PAC OT "6 Clicks" Daily Activity     Outcome Measure Help from another person eating meals?: None Help from another person taking care of personal grooming?: A Little Help from another person toileting, which includes using toliet, bedpan, or urinal?: A Lot Help from another person bathing (including washing, rinsing, drying)?: A Lot Help from another person to put on and taking off regular upper body clothing?: A Little Help from another person to put on and taking off regular lower body clothing?: A Lot 6 Click Score: 16   End of Session Nurse Communication: Other (comment) (NT re status)  Activity Tolerance: Patient tolerated treatment well Patient left: in chair;with call bell/phone within reach;with chair alarm set  OT Visit Diagnosis: Other abnormalities of gait and mobility (R26.89);Muscle weakness (generalized) (M62.81)  Time: 1610-9604 OT Time Calculation (min): 32 min Charges:  OT General Charges $OT Visit: 1 Visit OT Evaluation $OT Eval Moderate Complexity: 1 Mod  Gerre Kraft, OTD OTR/L  09/21/23, 4:15 PM

## 2023-09-21 NOTE — Care Management Important Message (Signed)
 Important Message  Patient Details  Name: Paul Bryan MRN: [id number] Date of Birth: 1929/10/27   Important Message Given:  Yes - Medicare IM     Anise Kerns 09/21/2023, 1:43 PM

## 2023-09-21 NOTE — TOC Progression Note (Signed)
 Transition of Care [facility]) - Progression Note    Patient Details  Name: Paul Bryan MRN: [id number] Date of Birth: 26-Dec-1929  [facility] CM/SW Contact  Joslyn Nim, RN Phone Number: 09/21/2023, 4:15 PM  Clinical Narrative:    CM received a call back from [facility] care Coordinator.  She asked if patient was admitted. CM informed her that he was in Obs status. She states that he can come back but will need d/c summary and AVS. She would like the discharge information be faxed to [phone]. CM asked about transportation. She states that we will have to arrange transportation   CM notified Dr. Butch Cashing and his nurse that the facility can accept him back.     Expected Discharge Plan: Assisted Living (pending therapy recommendations)    Expected Discharge Plan and Services       Living arrangements for the past 2 months: Assisted Living Facility [facility])                                       Social Determinants of Health (SDOH) Interventions SDOH Screenings   Food Insecurity: No Food Insecurity (09/20/2023)  Housing: Low Risk  (09/20/2023)  Transportation Needs: No Transportation Needs (09/20/2023)  Utilities: Not At Risk (09/20/2023)  Financial Resource Strain: Low Risk  (02/04/2019)  Physical Activity: Inactive (02/04/2019)  Social Connections: Socially Isolated (09/20/2023)  Stress: No Stress Concern Present (02/04/2019)  Tobacco Use: Medium Risk (09/20/2023)    Readmission Risk Interventions     No data to display

## 2023-09-21 NOTE — Telephone Encounter (Signed)
 CT has been ordered. I will send a message to scheduling.

## 2023-09-25 LAB — CULTURE, BLOOD (ROUTINE X 2)
Culture: NO GROWTH
Culture: NO GROWTH
Specimen Description: ADEQUATE
